# Patient Record
Sex: Female | Born: 1940
Health system: Southern US, Community
[De-identification: ages and names within clinical notes are randomized; demographics above are authoritative.]

## PROBLEM LIST (undated history)

## (undated) DIAGNOSIS — E785 Hyperlipidemia, unspecified: Secondary | ICD-10-CM

## (undated) DIAGNOSIS — I10 Essential (primary) hypertension: Secondary | ICD-10-CM

## (undated) DIAGNOSIS — M8580 Other specified disorders of bone density and structure, unspecified site: Secondary | ICD-10-CM

## (undated) DIAGNOSIS — R7303 Prediabetes: Secondary | ICD-10-CM

## (undated) DIAGNOSIS — G5600 Carpal tunnel syndrome, unspecified upper limb: Secondary | ICD-10-CM

## (undated) HISTORY — PX: APPENDECTOMY: SHX54

## (undated) HISTORY — DX: Prediabetes: R73.03

## (undated) HISTORY — DX: Hyperlipidemia, unspecified: E78.5

## (undated) HISTORY — DX: Carpal tunnel syndrome, unspecified upper limb: G56.00

## (undated) HISTORY — DX: Other specified disorders of bone density and structure, unspecified site: M85.80

## (undated) HISTORY — PX: HERNIA REPAIR: SHX51

## (undated) HISTORY — DX: Essential (primary) hypertension: I10

## (undated) HISTORY — PX: ABDOMINAL HYSTERECTOMY: SHX81

---

## 2008-05-03 ENCOUNTER — Ambulatory Visit: Payer: Self-pay | Admitting: Gastroenterology

## 2008-10-14 ENCOUNTER — Emergency Department: Payer: Self-pay | Admitting: Emergency Medicine

## 2010-11-11 HISTORY — PX: OTHER SURGICAL HISTORY: SHX169

## 2010-11-11 HISTORY — PX: UMBILICAL HERNIA REPAIR: SHX196

## 2011-07-23 ENCOUNTER — Ambulatory Visit: Payer: Self-pay | Admitting: Obstetrics and Gynecology

## 2011-07-31 ENCOUNTER — Ambulatory Visit: Payer: Self-pay | Admitting: Obstetrics and Gynecology

## 2014-02-03 ENCOUNTER — Ambulatory Visit: Payer: Self-pay | Admitting: Family Medicine

## 2015-01-11 DIAGNOSIS — Z1211 Encounter for screening for malignant neoplasm of colon: Secondary | ICD-10-CM | POA: Diagnosis not present

## 2015-01-11 DIAGNOSIS — Z01419 Encounter for gynecological examination (general) (routine) without abnormal findings: Secondary | ICD-10-CM | POA: Diagnosis not present

## 2015-01-19 DIAGNOSIS — H2513 Age-related nuclear cataract, bilateral: Secondary | ICD-10-CM | POA: Diagnosis not present

## 2015-01-24 DIAGNOSIS — E785 Hyperlipidemia, unspecified: Secondary | ICD-10-CM | POA: Diagnosis not present

## 2015-01-24 DIAGNOSIS — I10 Essential (primary) hypertension: Secondary | ICD-10-CM | POA: Diagnosis not present

## 2015-03-01 ENCOUNTER — Ambulatory Visit
Admit: 2015-03-01 | Disposition: A | Discharge status: Home / Self Care | Payer: Self-pay | Attending: Family Medicine | Admitting: Family Medicine

## 2015-03-01 DIAGNOSIS — Z1231 Encounter for screening mammogram for malignant neoplasm of breast: Secondary | ICD-10-CM | POA: Diagnosis not present

## 2015-03-07 ENCOUNTER — Ambulatory Visit
Admit: 2015-03-07 | Disposition: A | Discharge status: Home / Self Care | Payer: Self-pay | Attending: Family Medicine | Admitting: Family Medicine

## 2015-03-07 DIAGNOSIS — R922 Inconclusive mammogram: Secondary | ICD-10-CM | POA: Diagnosis not present

## 2015-03-07 DIAGNOSIS — R928 Other abnormal and inconclusive findings on diagnostic imaging of breast: Secondary | ICD-10-CM | POA: Diagnosis not present

## 2015-03-15 ENCOUNTER — Encounter: Payer: Self-pay | Admitting: Podiatry

## 2015-03-15 ENCOUNTER — Ambulatory Visit (INDEPENDENT_AMBULATORY_CARE_PROVIDER_SITE_OTHER): Payer: Medicare Other

## 2015-03-15 ENCOUNTER — Telehealth: Payer: Self-pay | Admitting: Podiatry

## 2015-03-15 ENCOUNTER — Ambulatory Visit (INDEPENDENT_AMBULATORY_CARE_PROVIDER_SITE_OTHER): Payer: Medicare Other | Admitting: Podiatry

## 2015-03-15 VITALS — BP 171/59 | HR 70 | Resp 16 | Ht 62.0 in | Wt 175.0 lb

## 2015-03-15 DIAGNOSIS — M779 Enthesopathy, unspecified: Secondary | ICD-10-CM | POA: Diagnosis not present

## 2015-03-15 DIAGNOSIS — M722 Plantar fascial fibromatosis: Secondary | ICD-10-CM

## 2015-03-15 MED ORDER — MELOXICAM 15 MG PO TABS: 15.0000 mg | ORAL_TABLET | Freq: Every day | ORAL | Status: DC

## 2015-03-15 MED ORDER — METHYLPREDNISOLONE 4 MG PO TBPK: ORAL_TABLET | ORAL | Status: DC

## 2015-03-15 NOTE — Progress Notes (Signed)
   Subjective:    Patient ID: Susan Summers, female    DOB: [birth year], 74 y.o.   MRN: [id number]  HPI Comments: "I have heel pain"  Patient c/o aching plantar heel left for about 2 months. AM pain. Went to PCP and he said he thought it was just bruised-no treatment. Tried soaking epsom salt. No better.  Also, c/o tender plantar forefoot right for about 2 months. Thought she might have stepped on a rock or something while moving. No treatment.  Foot Pain      Review of Systems  All other systems reviewed and are negative.      Objective:   Physical Exam: I have reviewed her past medical history medications allergies surgery social history and review of system. Pulses are strongly palpable bilateral. Neurologic sensorium is intact per Semmes-Weinstein monofilament. Deep tendon reflexes are intact bilateral and muscle strength +5 over 5 dorsiflexion plantar flexors and inverters everters all of his musculature is intact. Orthopedic evaluation demonstrates pain on end range of motion of the second metatarsophalangeal joint of the right foot. She is pain on palpation medial calcaneal tubercle of the left heel. She has no pain on medial and lateral compression of the calcaneus. Radiographic evaluation does demonstrate a soft tissue increase in density of myofascial calcaneal insertion site of the left heel. No joint abnormality of the second metatarsophalangeal joint right.        Assessment & Plan:  Assessment: Capsulitis second metatarsophalangeal joint right foot. Plantar fasciitis left heel.  Plan: Injected the second tarsophalangeal joint today with dexamethasone and local anesthetic after sterile Betadine skin prep. I also injected her left heel today with Kenalog and local anesthetic. Start her on a Medrol Dosepak to be followed by meloxicam. Her any plantar fascial strap and a night splint. We discussed appropriate shoe gear stretching exercises ice therapy and I will follow-up with  her in 1 month.

## 2015-03-15 NOTE — Patient Instructions (Signed)

## 2015-03-15 NOTE — Telephone Encounter (Signed)
[facility] CALLING ABOUT A MEDICATION THAT WAS TO BE CALLED IN.PLEASE CALL ASAP. THEY HAVE CALLED [facility] TO GET TO YOUR OFFICE

## 2015-04-12 ENCOUNTER — Ambulatory Visit (INDEPENDENT_AMBULATORY_CARE_PROVIDER_SITE_OTHER): Payer: Medicare Other | Admitting: Podiatry

## 2015-04-12 ENCOUNTER — Encounter: Payer: Self-pay | Admitting: Podiatry

## 2015-04-12 VITALS — BP 154/77 | HR 73 | Temp 98.3°F | Resp 16

## 2015-04-12 DIAGNOSIS — M722 Plantar fascial fibromatosis: Secondary | ICD-10-CM | POA: Diagnosis not present

## 2015-04-12 NOTE — Progress Notes (Signed)
She presents today for follow-up of plantar fasciitis. She states that they're both feeling much better the left is a little bit more tender than the right.  Objective: Signs are stable she is alert and oriented 3. She has pain on palpation medial calcaneal tubercle of the left heel. Pulses are palpable.  Assessment: Plantar fasciitis left over right.  Plan: Kenalog injection left heel today. Follow up with her in 1 month continue all other conservative therapies.

## 2015-04-25 ENCOUNTER — Ambulatory Visit (INDEPENDENT_AMBULATORY_CARE_PROVIDER_SITE_OTHER): Payer: Medicare Other | Admitting: Family Medicine

## 2015-04-25 ENCOUNTER — Encounter: Payer: Self-pay | Admitting: Family Medicine

## 2015-04-25 VITALS — BP 140/60 | HR 70 | Resp 15 | Ht 62.0 in | Wt 176.2 lb

## 2015-04-25 DIAGNOSIS — I10 Essential (primary) hypertension: Secondary | ICD-10-CM | POA: Diagnosis not present

## 2015-04-25 DIAGNOSIS — E669 Obesity, unspecified: Secondary | ICD-10-CM | POA: Insufficient documentation

## 2015-04-25 DIAGNOSIS — M722 Plantar fascial fibromatosis: Secondary | ICD-10-CM | POA: Insufficient documentation

## 2015-04-25 DIAGNOSIS — E785 Hyperlipidemia, unspecified: Secondary | ICD-10-CM | POA: Insufficient documentation

## 2015-04-25 MED ORDER — VALSARTAN 80 MG PO TABS: 80.0000 mg | ORAL_TABLET | Freq: Every day | ORAL | Status: DC

## 2015-04-25 MED ORDER — ROSUVASTATIN CALCIUM 20 MG PO TABS: 20.0000 mg | ORAL_TABLET | Freq: Every day | ORAL | Status: DC

## 2015-04-25 MED ORDER — AMLODIPINE BESYLATE 5 MG PO TABS: 5.0000 mg | ORAL_TABLET | Freq: Every day | ORAL | Status: DC

## 2015-04-25 NOTE — Progress Notes (Signed)
Name: Susan Summers   MRN: [id number]    DOB: 02/20/1941   Date:04/25/2015       Progress Note  Subjective  Chief Complaint  Chief Complaint  Patient presents with  . Follow-up    3 month   . Hyperlipidemia  . Hypertension    Hyperlipidemia This is a chronic problem. Recent lipid tests were reviewed and are normal. Pertinent negatives include no chest pain or shortness of breath. Current antihyperlipidemic treatment includes statins. The current treatment provides significant improvement of lipids. There are no compliance problems.   Hypertension This is a chronic problem. Pertinent negatives include no chest pain, headaches, orthopnea, palpitations, peripheral edema or shortness of breath. Past treatments include angiotensin blockers. The current treatment provides significant improvement. There is no history of angina, kidney disease, CAD/MI or CVA.      Past Medical History  Diagnosis Date  . Hypertension   . Hyperlipidemia     Past Surgical History  Procedure Laterality Date  . Abdominal hysterectomy    . Hernia repair      Family History  Problem Relation Age of Onset  . Hypertension Mother   . Stroke Mother   . Rheum arthritis Mother   . Diabetes Father   . Cancer Sister     Ovarian  . Diabetes Sister   . Hypertension Brother   . Melanoma Daughter   . Hypertension Son   . Hyperlipidemia Son   . Stroke Brother   . Cancer Brother     Prostate  . Hyperlipidemia Sister     History   Social History  . Marital Status: Widowed    Spouse Name: N/A  . Number of Children: N/A  . Years of Education: N/A   Occupational History  . Not on file.   Social History Main Topics  . Smoking status: Never Smoker   . Smokeless tobacco: Never Used  . Alcohol Use: No  . Drug Use: No  . Sexual Activity: Not Currently   Other Topics Concern  . Not on file   Social History Narrative     Current outpatient prescriptions:  .  amLODipine (NORVASC) 5 MG tablet,  Take 1 tablet (5 mg total) by mouth daily., Disp: 90 tablet, Rfl: 0 .  calcium citrate-vitamin D (CITRACAL+D) 315-200 MG-UNIT per tablet, Take 1 tablet by mouth 2 (two) times daily., Disp: , Rfl:  .  Cholecalciferol (VITAMIN D PO), Take 2,000 Units by mouth daily. , Disp: , Rfl:  .  ELDERBERRY PO, Take 1,250 mg by mouth daily. , Disp: , Rfl:  .  meloxicam (MOBIC) 15 MG tablet, Take 1 tablet (15 mg total) by mouth daily., Disp: 30 tablet, Rfl: 3 .  Omega-3 Fatty Acids (FISH OIL) 1000 MG CAPS, Take by mouth daily., Disp: , Rfl:  .  Probiotic Product (PROBIOTIC DAILY PO), Take by mouth daily. , Disp: , Rfl:  .  rosuvastatin (CRESTOR) 20 MG tablet, Take 1 tablet (20 mg total) by mouth daily., Disp: 90 tablet, Rfl: 0 .  valsartan (DIOVAN) 80 MG tablet, Take 1 tablet (80 mg total) by mouth daily., Disp: 90 tablet, Rfl: 0  Allergies  Allergen Reactions  . Nickel   . Penicillins      Review of Systems  Respiratory: Negative for shortness of breath.   Cardiovascular: Negative for chest pain, palpitations and orthopnea.  Neurological: Negative for headaches.      Objective  Filed Vitals:   04/25/15 0853  BP: 140/60  Pulse: 70  Resp: 15  Height: 5\' 2"  (1.575 m)  Weight: 176 lb 3.2 oz (79.924 kg)  SpO2: 94%    Physical Exam  Constitutional: She is well-developed, well-nourished, and in no distress.  HENT:  Head: Normocephalic and atraumatic.  Cardiovascular: Normal rate and regular rhythm.   Pulmonary/Chest: Effort normal and breath sounds normal.  Abdominal: Soft. Bowel sounds are normal.       No results found for this or any previous visit (from the past 2160 hour(s)).   Assessment & Plan 1. Benign hypertension  - valsartan (DIOVAN) 80 MG tablet; Take 1 tablet (80 mg total) by mouth daily.  Dispense: 90 tablet; Refill: 0 - amLODipine (NORVASC) 5 MG tablet; Take 1 tablet (5 mg total) by mouth daily.  Dispense: 90 tablet; Refill: 0  2. HLD (hyperlipidemia)  -  rosuvastatin (CRESTOR) 20 MG tablet; Take 1 tablet (20 mg total) by mouth daily.  Dispense: 90 tablet; Refill: 0 - Lipid Profile - Comprehensive metabolic panel  There are no diagnoses linked to this encounter.  Deloma Spindle [facility] 04/25/2015 9:27 AM

## 2015-04-26 LAB — COMPREHENSIVE METABOLIC PANEL
ALBUMIN: 4.6 g/dL (ref 3.5–4.8)
ALK PHOS: 85 IU/L (ref 39–117)
ALT: 21 IU/L (ref 0–32)
AST: 18 IU/L (ref 0–40)
Albumin/Globulin Ratio: 1.9 (ref 1.1–2.5)
BUN / CREAT RATIO: 15 (ref 11–26)
BUN: 11 mg/dL (ref 8–27)
Bilirubin Total: 0.8 mg/dL (ref 0.0–1.2)
CO2: 24 mmol/L (ref 18–29)
CREATININE: 0.71 mg/dL (ref 0.57–1.00)
Calcium: 9.4 mg/dL (ref 8.7–10.3)
Chloride: 101 mmol/L (ref 97–108)
GFR calc non Af Amer: 85 mL/min/{1.73_m2} (ref >59)
GFR, EST AFRICAN AMERICAN: 98 mL/min/{1.73_m2} (ref >59)
Globulin, Total: 2.4 g/dL (ref 1.5–4.5)
Glucose: 109 mg/dL — ABNORMAL HIGH (ref 65–99)
Potassium: 4.9 mmol/L (ref 3.5–5.2)
Sodium: 143 mmol/L (ref 134–144)
Total Protein: 7 g/dL (ref 6.0–8.5)

## 2015-04-26 LAB — LIPID PANEL
Chol/HDL Ratio: 2.9 ratio units (ref 0.0–4.4)
Cholesterol, Total: 153 mg/dL (ref 100–199)
HDL: 53 mg/dL (ref >39)
LDL Calculated: 74 mg/dL (ref 0–99)
Triglycerides: 132 mg/dL (ref 0–149)
VLDL Cholesterol Cal: 26 mg/dL (ref 5–40)

## 2015-05-10 ENCOUNTER — Ambulatory Visit: Payer: Medicare Other | Admitting: Podiatry

## 2015-07-12 ENCOUNTER — Telehealth: Payer: Self-pay | Admitting: Family Medicine

## 2015-07-12 DIAGNOSIS — I10 Essential (primary) hypertension: Secondary | ICD-10-CM

## 2015-07-12 MED ORDER — AMLODIPINE BESYLATE 5 MG PO TABS: 5.0000 mg | ORAL_TABLET | Freq: Every day | ORAL | Status: DC

## 2015-07-12 NOTE — Telephone Encounter (Signed)
Medication has been refilled and sent to [facility] 

## 2015-07-24 ENCOUNTER — Telehealth: Payer: Self-pay | Admitting: Family Medicine

## 2015-07-24 DIAGNOSIS — I10 Essential (primary) hypertension: Secondary | ICD-10-CM

## 2015-07-24 MED ORDER — VALSARTAN 80 MG PO TABS: 80.0000 mg | ORAL_TABLET | Freq: Every day | ORAL | Status: DC

## 2015-07-24 NOTE — Telephone Encounter (Signed)
Medication has been refilled and sent to [facility] °

## 2015-07-27 ENCOUNTER — Ambulatory Visit: Payer: Medicare Other | Admitting: Family Medicine

## 2015-07-31 ENCOUNTER — Ambulatory Visit: Payer: Medicare Other | Admitting: Family Medicine

## 2015-08-09 ENCOUNTER — Ambulatory Visit (INDEPENDENT_AMBULATORY_CARE_PROVIDER_SITE_OTHER): Payer: Medicare Other | Admitting: Family Medicine

## 2015-08-09 ENCOUNTER — Encounter: Payer: Self-pay | Admitting: Family Medicine

## 2015-08-09 VITALS — BP 136/77 | HR 73 | Temp 98.2°F | Resp 17 | Ht 62.0 in | Wt 179.8 lb

## 2015-08-09 DIAGNOSIS — E785 Hyperlipidemia, unspecified: Secondary | ICD-10-CM

## 2015-08-09 DIAGNOSIS — M25472 Effusion, left ankle: Secondary | ICD-10-CM

## 2015-08-09 DIAGNOSIS — M25471 Effusion, right ankle: Secondary | ICD-10-CM

## 2015-08-09 DIAGNOSIS — I10 Essential (primary) hypertension: Secondary | ICD-10-CM | POA: Diagnosis not present

## 2015-08-09 DIAGNOSIS — M722 Plantar fascial fibromatosis: Secondary | ICD-10-CM

## 2015-08-09 HISTORY — DX: Effusion, left ankle: M25.471

## 2015-08-09 MED ORDER — VALSARTAN 80 MG PO TABS: 160.0000 mg | ORAL_TABLET | Freq: Every day | ORAL | Status: DC

## 2015-08-09 MED ORDER — ROSUVASTATIN CALCIUM 20 MG PO TABS: 20.0000 mg | ORAL_TABLET | Freq: Every day | ORAL | Status: DC

## 2015-08-09 NOTE — Progress Notes (Signed)
Name: Susan Summers   MRN: [id number]    DOB: 24-Oct-1941   Date:08/09/2015       Progress Note  Subjective  Chief Complaint  Chief Complaint  Patient presents with  . Follow-up    3 MO  . Hyperlipidemia  . Hypertension    Hyperlipidemia This is a chronic problem. The problem is controlled. Recent lipid tests were reviewed and are normal. Pertinent negatives include no chest pain, leg pain, myalgias or shortness of breath. Current antihyperlipidemic treatment includes statins.  Hypertension This is a chronic problem. The problem is unchanged. The problem is controlled. Pertinent negatives include no blurred vision, chest pain, headaches, palpitations (has noticed that her heart will flutter 'every once in a while') or shortness of breath. Past treatments include angiotensin blockers and calcium channel blockers. There is no history of kidney disease, CAD/MI or CVA.   Past Medical History  Diagnosis Date  . Hypertension   . Hyperlipidemia     Past Surgical History  Procedure Laterality Date  . Abdominal hysterectomy    . Hernia repair      Family History  Problem Relation Age of Onset  . Hypertension Mother   . Stroke Mother   . Rheum arthritis Mother   . Diabetes Father   . Cancer Sister     Ovarian  . Diabetes Sister   . Hypertension Brother   . Melanoma Daughter   . Hypertension Son   . Hyperlipidemia Son   . Stroke Brother   . Cancer Brother     Prostate  . Hyperlipidemia Sister     Social History   Social History  . Marital Status: Widowed    Spouse Name: N/A  . Number of Children: N/A  . Years of Education: N/A   Occupational History  . Not on file.   Social History Main Topics  . Smoking status: Never Smoker   . Smokeless tobacco: Never Used  . Alcohol Use: No  . Drug Use: No  . Sexual Activity: Not Currently   Other Topics Concern  . Not on file   Social History Narrative    Current outpatient prescriptions:  .  amLODipine (NORVASC) 5 MG  tablet, Take 1 tablet (5 mg total) by mouth daily., Disp: 90 tablet, Rfl: 0 .  calcium citrate-vitamin D (CITRACAL+D) 315-200 MG-UNIT per tablet, Take 1 tablet by mouth 2 (two) times daily., Disp: , Rfl:  .  Cholecalciferol (VITAMIN D PO), Take 2,000 Units by mouth daily. , Disp: , Rfl:  .  ELDERBERRY PO, Take 1,250 mg by mouth daily. , Disp: , Rfl:  .  meloxicam (MOBIC) 15 MG tablet, Take 1 tablet (15 mg total) by mouth daily., Disp: 30 tablet, Rfl: 3 .  Omega-3 Fatty Acids (FISH OIL) 1000 MG CAPS, Take by mouth daily., Disp: , Rfl:  .  Probiotic Product (PROBIOTIC DAILY PO), Take by mouth daily. , Disp: , Rfl:  .  rosuvastatin (CRESTOR) 20 MG tablet, Take 1 tablet (20 mg total) by mouth daily., Disp: 90 tablet, Rfl: 0 .  valsartan (DIOVAN) 80 MG tablet, Take 1 tablet (80 mg total) by mouth daily., Disp: 90 tablet, Rfl: 0  Allergies  Allergen Reactions  . Nickel   . Penicillins    Review of Systems  Eyes: Negative for blurred vision and double vision.  Respiratory: Negative for shortness of breath.   Cardiovascular: Positive for leg swelling. Negative for chest pain and palpitations (has noticed that her heart will flutter 'every once  in a while').  Musculoskeletal: Negative for myalgias.  Neurological: Negative for headaches.    Objective  Filed Vitals:   08/09/15 1459  BP: 136/77  Pulse: 73  Temp: 98.2 F (36.8 C)  TempSrc: Oral  Resp: 17  Height: 5\' 2"  (1.575 m)  Weight: 179 lb 12.8 oz (81.557 kg)  SpO2: 97%    Physical Exam  Constitutional: She is oriented to person, place, and time and well-developed, well-nourished, and in no distress.  HENT:  Head: Normocephalic and atraumatic.  Cardiovascular: Normal rate and regular rhythm.   Pulmonary/Chest: Effort normal and breath sounds normal.  Musculoskeletal:       Right ankle: She exhibits no swelling.       Left ankle: She exhibits no swelling.  Neurological: She is alert and oriented to person, place, and time.   Nursing note and vitals reviewed.   Assessment & Plan  1. Benign hypertension Increase Diovan to 160 mg daily. DC amlodipine due to concern for bilateral ankle swelling. Follow-up with blood pressure check in 3 months. - valsartan (DIOVAN) 80 MG tablet; Take 2 tablets (160 mg total) by mouth daily.  Dispense: 90 tablet; Refill: 0  2. Plantar fasciitis Followed by podiatry and currently on meloxicam 15 mg daily.  3. HLD (hyperlipidemia)  - rosuvastatin (CRESTOR) 20 MG tablet; Take 1 tablet (20 mg total) by mouth at bedtime.  Dispense: 90 tablet; Refill: 0  4. Swelling of both ankles DC amlodipine and evaluate if the ankle swelling goes away. Patient was asked to elevate her ankles as well. Follow-up if no clinical improvement.   Syed [facility] 08/09/2015 3:11 PM

## 2015-09-13 ENCOUNTER — Telehealth: Payer: Self-pay

## 2015-09-13 DIAGNOSIS — I10 Essential (primary) hypertension: Secondary | ICD-10-CM

## 2015-09-13 MED ORDER — VALSARTAN 80 MG PO TABS: 160.0000 mg | ORAL_TABLET | Freq: Every day | ORAL | Status: DC

## 2015-09-13 NOTE — Telephone Encounter (Signed)
Valsartan 80mg  has been refilled and sent to [facility]

## 2015-11-09 ENCOUNTER — Other Ambulatory Visit: Payer: Self-pay | Admitting: Family Medicine

## 2015-11-14 ENCOUNTER — Encounter: Payer: Self-pay | Admitting: Family Medicine

## 2015-11-14 ENCOUNTER — Ambulatory Visit (INDEPENDENT_AMBULATORY_CARE_PROVIDER_SITE_OTHER): Payer: Medicare Other | Admitting: Family Medicine

## 2015-11-14 VITALS — BP 134/78 | HR 72 | Temp 98.1°F | Resp 15 | Ht 62.0 in | Wt 182.1 lb

## 2015-11-14 DIAGNOSIS — E785 Hyperlipidemia, unspecified: Secondary | ICD-10-CM

## 2015-11-14 DIAGNOSIS — R739 Hyperglycemia, unspecified: Secondary | ICD-10-CM | POA: Diagnosis not present

## 2015-11-14 DIAGNOSIS — I10 Essential (primary) hypertension: Secondary | ICD-10-CM | POA: Diagnosis not present

## 2015-11-14 LAB — POCT GLYCOSYLATED HEMOGLOBIN (HGB A1C): Hemoglobin A1C: 5.7

## 2015-11-14 MED ORDER — VALSARTAN 80 MG PO TABS: 160.0000 mg | ORAL_TABLET | Freq: Every day | ORAL | Status: DC

## 2015-11-14 MED ORDER — ROSUVASTATIN CALCIUM 20 MG PO TABS: 20.0000 mg | ORAL_TABLET | Freq: Every day | ORAL | Status: DC

## 2015-11-14 NOTE — Progress Notes (Signed)
Name: Susan Summers   MRN: [id number]    DOB: September 08, 1941   Date:11/14/2015       Progress Note  Subjective  Chief Complaint  Chief Complaint  Patient presents with  . Follow-up    3 mo  . Hyperlipidemia  . Hypertension    Hyperlipidemia This is a chronic problem. The problem is controlled. Recent lipid tests were reviewed and are normal. Pertinent negatives include no chest pain, leg pain, myalgias or shortness of breath. Current antihyperlipidemic treatment includes statins.  Hypertension This is a chronic problem. The problem is unchanged. The problem is controlled. Associated symptoms include headaches. Pertinent negatives include no blurred vision, chest pain, palpitations or shortness of breath. Past treatments include angiotensin blockers. There is no history of kidney disease, CAD/MI or CVA.    Past Medical History  Diagnosis Date  . Hypertension   . Hyperlipidemia     Past Surgical History  Procedure Laterality Date  . Abdominal hysterectomy    . Hernia repair      Family History  Problem Relation Age of Onset  . Hypertension Mother   . Stroke Mother   . Rheum arthritis Mother   . Diabetes Father   . Cancer Sister     Ovarian  . Diabetes Sister   . Hypertension Brother   . Melanoma Daughter   . Hypertension Son   . Hyperlipidemia Son   . Stroke Brother   . Cancer Brother     Prostate  . Hyperlipidemia Sister     Social History   Social History  . Marital Status: Widowed    Spouse Name: N/A  . Number of Children: N/A  . Years of Education: N/A   Occupational History  . Not on file.   Social History Main Topics  . Smoking status: Never Smoker   . Smokeless tobacco: Never Used  . Alcohol Use: No  . Drug Use: No  . Sexual Activity: Not Currently   Other Topics Concern  . Not on file   Social History Narrative    Current outpatient prescriptions:  .  calcium citrate-vitamin D (CITRACAL+D) 315-200 MG-UNIT per tablet, Take 1 tablet by mouth 2  (two) times daily., Disp: , Rfl:  .  Cholecalciferol (VITAMIN D PO), Take 2,000 Units by mouth daily. , Disp: , Rfl:  .  ELDERBERRY PO, Take 1,250 mg by mouth daily. , Disp: , Rfl:  .  meloxicam (MOBIC) 15 MG tablet, Take 1 tablet (15 mg total) by mouth daily., Disp: 30 tablet, Rfl: 3 .  Omega-3 Fatty Acids (FISH OIL) 1000 MG CAPS, Take by mouth daily., Disp: , Rfl:  .  Probiotic Product (PROBIOTIC DAILY PO), Take by mouth daily. , Disp: , Rfl:  .  rosuvastatin (CRESTOR) 20 MG tablet, TAKE 1 TABLET BY MOUTH DAILY AT BEDTIME, Disp: 90 tablet, Rfl: 0 .  valsartan (DIOVAN) 80 MG tablet, TAKE 2 TABLETS BY MOUTH DAILY, Disp: 180 tablet, Rfl: 0  Allergies  Allergen Reactions  . Nickel   . Penicillins      Review of Systems  Constitutional: Negative for fever, chills and weight loss.  Eyes: Negative for blurred vision and double vision.  Respiratory: Negative for cough and shortness of breath.   Cardiovascular: Negative for chest pain and palpitations.  Musculoskeletal: Negative for myalgias.  Neurological: Positive for headaches.    Objective  Filed Vitals:   11/14/15 1404  BP: 134/78  Pulse: 72  Temp: 98.1 F (36.7 C)  TempSrc: Oral  Resp: 15  Height: 5\' 2"  (1.575 m)  Weight: 182 lb 1.6 oz (82.6 kg)  SpO2: 96%    Physical Exam  Constitutional: She is oriented to person, place, and time and well-developed, well-nourished, and in no distress.  HENT:  Head: Normocephalic and atraumatic.  Cardiovascular: Normal rate and regular rhythm.   Pulmonary/Chest: Effort normal and breath sounds normal.  Musculoskeletal: She exhibits no edema or tenderness.       Right ankle: She exhibits no swelling.       Left ankle: She exhibits no swelling.  Neurological: She is alert and oriented to person, place, and time.  Skin: Skin is warm and dry.  Nursing note and vitals reviewed.    Assessment & Plan  1. Benign hypertension  - valsartan (DIOVAN) 80 MG tablet; Take 2 tablets (160 mg  total) by mouth daily.  Dispense: 180 tablet; Refill: 0  2. HLD (hyperlipidemia)  - rosuvastatin (CRESTOR) 20 MG tablet; Take 1 tablet (20 mg total) by mouth at bedtime.  Dispense: 90 tablet; Refill: 0 - Lipid Profile - Comprehensive Metabolic Panel (CMET)  3. Hyperglycemia  - POCT HgB A1C   Trinaty Bundrick [facility] 11/14/2015 2:19 PM

## 2015-11-15 LAB — COMPREHENSIVE METABOLIC PANEL
ALT: 20 IU/L (ref 0–32)
AST: 17 IU/L (ref 0–40)
Albumin/Globulin Ratio: 2 (ref 1.1–2.5)
Albumin: 4.7 g/dL (ref 3.5–4.8)
Alkaline Phosphatase: 81 IU/L (ref 39–117)
BUN/Creatinine Ratio: 18 (ref 11–26)
BUN: 12 mg/dL (ref 8–27)
Bilirubin Total: 1.1 mg/dL (ref 0.0–1.2)
CALCIUM: 9.6 mg/dL (ref 8.7–10.3)
CO2: 26 mmol/L (ref 18–29)
CREATININE: 0.66 mg/dL (ref 0.57–1.00)
Chloride: 100 mmol/L (ref 96–106)
GFR calc Af Amer: 102 mL/min/{1.73_m2} (ref >59)
GFR, EST NON AFRICAN AMERICAN: 89 mL/min/{1.73_m2} (ref >59)
Globulin, Total: 2.4 g/dL (ref 1.5–4.5)
Glucose: 93 mg/dL (ref 65–99)
POTASSIUM: 4.3 mmol/L (ref 3.5–5.2)
Sodium: 141 mmol/L (ref 134–144)
Total Protein: 7.1 g/dL (ref 6.0–8.5)

## 2015-11-15 LAB — LIPID PANEL
CHOL/HDL RATIO: 3.2 ratio (ref 0.0–4.4)
Cholesterol, Total: 139 mg/dL (ref 100–199)
HDL: 43 mg/dL (ref >39)
LDL Calculated: 68 mg/dL (ref 0–99)
Triglycerides: 140 mg/dL (ref 0–149)
VLDL CHOLESTEROL CAL: 28 mg/dL (ref 5–40)

## 2016-01-10 ENCOUNTER — Encounter: Payer: Self-pay | Admitting: Family Medicine

## 2016-01-10 ENCOUNTER — Ambulatory Visit (INDEPENDENT_AMBULATORY_CARE_PROVIDER_SITE_OTHER): Payer: Medicare Other | Admitting: Family Medicine

## 2016-01-10 VITALS — BP 132/77 | HR 77 | Temp 98.4°F | Resp 16 | Ht 62.0 in | Wt 184.3 lb

## 2016-01-10 DIAGNOSIS — L309 Dermatitis, unspecified: Secondary | ICD-10-CM | POA: Diagnosis not present

## 2016-01-10 MED ORDER — TRIAMCINOLONE ACETONIDE 0.5 % EX CREA: 1.0000 "application " | TOPICAL_CREAM | Freq: Three times a day (TID) | CUTANEOUS | Status: DC

## 2016-01-10 NOTE — Progress Notes (Signed)
Name: Susan Summers   MRN: [id number]    DOB: Sep 18, 1941   Date:01/10/2016       Progress Note  Subjective  Chief Complaint  Chief Complaint  Patient presents with  . Acute Visit    Rash    Rash This is a recurrent problem. The affected locations include the left lower leg, right lowerleg, left arm and right arm. The rash is characterized by itchiness and redness. She was exposed to nothing (However, first time she noticed the rash was when she returned from the beach in August 2016.). Pertinent negatives include no fatigue, fever, joint pain or shortness of breath. Past treatments include moisturizer and anti-itch cream. The treatment provided no relief. There is no history of asthma or eczema.    Past Medical History  Diagnosis Date  . Hypertension   . Hyperlipidemia     Past Surgical History  Procedure Laterality Date  . Abdominal hysterectomy    . Hernia repair      Family History  Problem Relation Age of Onset  . Hypertension Mother   . Stroke Mother   . Rheum arthritis Mother   . Diabetes Father   . Cancer Sister     Ovarian  . Diabetes Sister   . Hypertension Brother   . Melanoma Daughter   . Hypertension Son   . Hyperlipidemia Son   . Stroke Brother   . Cancer Brother     Prostate  . Hyperlipidemia Sister     Social History   Social History  . Marital Status: Widowed    Spouse Name: N/A  . Number of Children: N/A  . Years of Education: N/A   Occupational History  . Not on file.   Social History Main Topics  . Smoking status: Never Smoker   . Smokeless tobacco: Never Used  . Alcohol Use: No  . Drug Use: No  . Sexual Activity: Not Currently   Other Topics Concern  . Not on file   Social History Narrative     Current outpatient prescriptions:  .  calcium citrate-vitamin D (CITRACAL+D) 315-200 MG-UNIT per tablet, Take 1 tablet by mouth 2 (two) times daily., Disp: , Rfl:  .  Cholecalciferol (VITAMIN D PO), Take 2,000 Units by mouth daily. ,  Disp: , Rfl:  .  ELDERBERRY PO, Take 1,250 mg by mouth daily. , Disp: , Rfl:  .  Omega-3 Fatty Acids (FISH OIL) 1000 MG CAPS, Take by mouth daily., Disp: , Rfl:  .  Probiotic Product (PROBIOTIC DAILY PO), Take by mouth daily. , Disp: , Rfl:  .  rosuvastatin (CRESTOR) 20 MG tablet, Take 1 tablet (20 mg total) by mouth at bedtime., Disp: 90 tablet, Rfl: 0 .  valsartan (DIOVAN) 80 MG tablet, Take 2 tablets (160 mg total) by mouth daily., Disp: 180 tablet, Rfl: 0 .  meloxicam (MOBIC) 15 MG tablet, Take 1 tablet (15 mg total) by mouth daily. (Patient not taking: Reported on 01/10/2016), Disp: 30 tablet, Rfl: 3  Allergies  Allergen Reactions  . Nickel   . Penicillins      Review of Systems  Constitutional: Negative for fever, chills and fatigue.  Respiratory: Negative for shortness of breath.   Musculoskeletal: Negative for joint pain.  Skin: Positive for rash.     Objective  Filed Vitals:   01/10/16 0845  BP: 132/77  Pulse: 77  Temp: 98.4 F (36.9 C)  TempSrc: Oral  Resp: 16  Height: 5\' 2"  (1.575 m)  Weight: 184 lb  4.8 oz (83.598 kg)  SpO2: 98%    Physical Exam  Constitutional: She is well-developed, well-nourished, and in no distress.  Skin: Rash noted. Rash is pustular.  Scattered healing pustular lesions, spread over left and right distal lower extremities, and upper extremities.   Nursing note and vitals reviewed.   Recent Results (from the past 2160 hour(s))  Lipid Profile     Status: None   Collection Time: 11/14/15  2:42 PM  Result Value Ref Range   Cholesterol, Total 139 100 - 199 mg/dL   Triglycerides 140 0 - 149 mg/dL   HDL 43 >39 mg/dL   VLDL Cholesterol Cal 28 5 - 40 mg/dL   LDL Calculated 68 0 - 99 mg/dL   Chol/HDL Ratio 3.2 0.0 - 4.4 ratio units    Comment:                                   T. Chol/HDL Ratio                                             Men  Women                               1/2 Avg.Risk  3.4    3.3                                    Avg.Risk  5.0    4.4                                2X Avg.Risk  9.6    7.1                                3X Avg.Risk 23.4   11.0   Comprehensive Metabolic Panel (CMET)     Status: None   Collection Time: 11/14/15  2:42 PM  Result Value Ref Range   Glucose 93 65 - 99 mg/dL   BUN 12 8 - 27 mg/dL   Creatinine, Ser 0.66 0.57 - 1.00 mg/dL   GFR calc non Af Amer 89 >59 mL/min/1.73   GFR calc Af Amer 102 >59 mL/min/1.73   BUN/Creatinine Ratio 18 11 - 26   Sodium 141 134 - 144 mmol/L   Potassium 4.3 3.5 - 5.2 mmol/L   Chloride 100 96 - 106 mmol/L   CO2 26 18 - 29 mmol/L   Calcium 9.6 8.7 - 10.3 mg/dL   Total Protein 7.1 6.0 - 8.5 g/dL   Albumin 4.7 3.5 - 4.8 g/dL   Globulin, Total 2.4 1.5 - 4.5 g/dL   Albumin/Globulin Ratio 2.0 1.1 - 2.5   Bilirubin Total 1.1 0.0 - 1.2 mg/dL   Alkaline Phosphatase 81 39 - 117 IU/L   AST 17 0 - 40 IU/L   ALT 20 0 - 32 IU/L  POCT HgB A1C     Status: Normal   Collection Time: 11/14/15  3:12 PM  Result Value Ref Range   Hemoglobin A1C 5.7      Assessment & Plan  1. Dermatitis Start on Kenalog cream for relief of dermatitis. - triamcinolone cream (KENALOG) 0.5 %; Apply 1 application topically 3 (three) times daily.  Dispense: 30 g; Refill: 0   Malacai Grantz [facility] 01/10/2016 8:48 AM

## 2016-04-09 DIAGNOSIS — D229 Melanocytic nevi, unspecified: Secondary | ICD-10-CM | POA: Diagnosis not present

## 2016-04-09 DIAGNOSIS — L814 Other melanin hyperpigmentation: Secondary | ICD-10-CM | POA: Diagnosis not present

## 2016-04-09 DIAGNOSIS — D1800 Hemangioma unspecified site: Secondary | ICD-10-CM | POA: Diagnosis not present

## 2016-04-09 DIAGNOSIS — L719 Rosacea, unspecified: Secondary | ICD-10-CM | POA: Diagnosis not present

## 2016-04-09 DIAGNOSIS — L821 Other seborrheic keratosis: Secondary | ICD-10-CM | POA: Diagnosis not present

## 2016-05-13 ENCOUNTER — Encounter: Payer: Self-pay | Admitting: Family Medicine

## 2016-05-13 ENCOUNTER — Ambulatory Visit (INDEPENDENT_AMBULATORY_CARE_PROVIDER_SITE_OTHER): Payer: Medicare Other | Admitting: Family Medicine

## 2016-05-13 VITALS — BP 136/71 | HR 70 | Temp 97.7°F | Resp 16 | Ht 62.0 in | Wt 183.1 lb

## 2016-05-13 DIAGNOSIS — I10 Essential (primary) hypertension: Secondary | ICD-10-CM | POA: Diagnosis not present

## 2016-05-13 DIAGNOSIS — E785 Hyperlipidemia, unspecified: Secondary | ICD-10-CM | POA: Diagnosis not present

## 2016-05-13 MED ORDER — VALSARTAN 80 MG PO TABS: 160.0000 mg | ORAL_TABLET | Freq: Every day | ORAL | Status: DC

## 2016-05-13 MED ORDER — ROSUVASTATIN CALCIUM 20 MG PO TABS: 20.0000 mg | ORAL_TABLET | Freq: Every day | ORAL | Status: DC

## 2016-05-13 NOTE — Progress Notes (Signed)
Name: Susan Summers   MRN: [id number]    DOB: 02-19-1941   Date:05/13/2016       Progress Note  Subjective  Chief Complaint  Chief Complaint  Patient presents with  . Follow-up    6 mo  . Medication Refill    crestor 20 mg / valsartan 160 mg     Hyperlipidemia This is a chronic problem. The problem is controlled. Recent lipid tests were reviewed and are normal. Pertinent negatives include no chest pain, leg pain, myalgias or shortness of breath. Current antihyperlipidemic treatment includes statins.  Hypertension This is a chronic problem. The problem is unchanged. The problem is controlled. Pertinent negatives include no blurred vision, chest pain, headaches, palpitations or shortness of breath. Past treatments include angiotensin blockers. There is no history of kidney disease, CAD/MI or CVA.    Past Medical History  Diagnosis Date  . Hypertension   . Hyperlipidemia     Past Surgical History  Procedure Laterality Date  . Abdominal hysterectomy    . Hernia repair      Family History  Problem Relation Age of Onset  . Hypertension Mother   . Stroke Mother   . Rheum arthritis Mother   . Diabetes Father   . Cancer Sister     Ovarian  . Diabetes Sister   . Hypertension Brother   . Melanoma Daughter   . Hypertension Son   . Hyperlipidemia Son   . Stroke Brother   . Cancer Brother     Prostate  . Hyperlipidemia Sister     Social History   Social History  . Marital Status: Widowed    Spouse Name: N/A  . Number of Children: N/A  . Years of Education: N/A   Occupational History  . Not on file.   Social History Main Topics  . Smoking status: Never Smoker   . Smokeless tobacco: Never Used  . Alcohol Use: No  . Drug Use: No  . Sexual Activity: Not Currently   Other Topics Concern  . Not on file   Social History Narrative     Current outpatient prescriptions:  .  calcium citrate-vitamin D (CITRACAL+D) 315-200 MG-UNIT per tablet, Take 1 tablet by mouth 2  (two) times daily., Disp: , Rfl:  .  Cholecalciferol (VITAMIN D PO), Take 2,000 Units by mouth daily. , Disp: , Rfl:  .  ELDERBERRY PO, Take 1,250 mg by mouth daily. , Disp: , Rfl:  .  meloxicam (MOBIC) 15 MG tablet, Take 1 tablet (15 mg total) by mouth daily., Disp: 30 tablet, Rfl: 3 .  Omega-3 Fatty Acids (FISH OIL) 1000 MG CAPS, Take by mouth daily., Disp: , Rfl:  .  Probiotic Product (PROBIOTIC DAILY PO), Take by mouth daily. , Disp: , Rfl:  .  rosuvastatin (CRESTOR) 20 MG tablet, Take 1 tablet (20 mg total) by mouth at bedtime., Disp: 90 tablet, Rfl: 0 .  triamcinolone cream (KENALOG) 0.5 %, Apply 1 application topically 3 (three) times daily., Disp: 30 g, Rfl: 0 .  valsartan (DIOVAN) 80 MG tablet, Take 2 tablets (160 mg total) by mouth daily., Disp: 180 tablet, Rfl: 0  Allergies  Allergen Reactions  . Nickel   . Penicillins      Review of Systems  Eyes: Negative for blurred vision.  Respiratory: Negative for shortness of breath.   Cardiovascular: Negative for chest pain and palpitations.  Musculoskeletal: Negative for myalgias.  Neurological: Negative for headaches.    Objective  Filed Vitals:  05/13/16 0846  BP: 136/71  Pulse: 70  Temp: 97.7 F (36.5 C)  TempSrc: Oral  Resp: 16  Height: 5\' 2"  (1.575 m)  Weight: 183 lb 1.6 oz (83.054 kg)  SpO2: 96%    Physical Exam  Constitutional: She is oriented to person, place, and time and well-developed, well-nourished, and in no distress.  HENT:  Head: Normocephalic and atraumatic.  Cardiovascular: Normal rate and regular rhythm.   Pulmonary/Chest: Effort normal and breath sounds normal.  Musculoskeletal: She exhibits no edema or tenderness.       Right ankle: She exhibits no swelling.       Left ankle: She exhibits no swelling.  Neurological: She is alert and oriented to person, place, and time.  Skin: Skin is warm and dry.  Nursing note and vitals reviewed.      Assessment & Plan  1. Benign hypertension BP  at goal and controlled on present antihypertensive therapy. - valsartan (DIOVAN) 80 MG tablet; Take 2 tablets (160 mg total) by mouth daily.  Dispense: 180 tablet; Refill: 0  2. HLD (hyperlipidemia) FLP is at goal - rosuvastatin (CRESTOR) 20 MG tablet; Take 1 tablet (20 mg total) by mouth at bedtime.  Dispense: 90 tablet; Refill: 0 - Lipid Profile - Comprehensive Metabolic Panel (CMET)   Susan Summers [facility] 05/13/2016 8:57 AM

## 2016-05-14 LAB — LIPID PANEL
CHOLESTEROL TOTAL: 125 mg/dL (ref 100–199)
Chol/HDL Ratio: 3 ratio units (ref 0.0–4.4)
HDL: 41 mg/dL (ref >39)
LDL Calculated: 55 mg/dL (ref 0–99)
TRIGLYCERIDES: 146 mg/dL (ref 0–149)
VLDL Cholesterol Cal: 29 mg/dL (ref 5–40)

## 2016-05-14 LAB — COMPREHENSIVE METABOLIC PANEL
A/G RATIO: 2.1 (ref 1.2–2.2)
ALBUMIN: 4.7 g/dL (ref 3.5–4.8)
ALT: 23 IU/L (ref 0–32)
AST: 21 IU/L (ref 0–40)
Alkaline Phosphatase: 79 IU/L (ref 39–117)
BUN / CREAT RATIO: 20 (ref 12–28)
BUN: 13 mg/dL (ref 8–27)
Bilirubin Total: 0.7 mg/dL (ref 0.0–1.2)
CO2: 24 mmol/L (ref 18–29)
Calcium: 9.2 mg/dL (ref 8.7–10.3)
Chloride: 101 mmol/L (ref 96–106)
Creatinine, Ser: 0.66 mg/dL (ref 0.57–1.00)
GFR, EST AFRICAN AMERICAN: 101 mL/min/{1.73_m2} (ref >59)
GFR, EST NON AFRICAN AMERICAN: 88 mL/min/{1.73_m2} (ref >59)
Globulin, Total: 2.2 g/dL (ref 1.5–4.5)
Glucose: 104 mg/dL — ABNORMAL HIGH (ref 65–99)
POTASSIUM: 4.4 mmol/L (ref 3.5–5.2)
Sodium: 142 mmol/L (ref 134–144)
TOTAL PROTEIN: 6.9 g/dL (ref 6.0–8.5)

## 2016-08-06 ENCOUNTER — Other Ambulatory Visit: Payer: Self-pay | Admitting: Family Medicine

## 2016-08-06 DIAGNOSIS — I10 Essential (primary) hypertension: Secondary | ICD-10-CM

## 2016-08-26 ENCOUNTER — Telehealth: Payer: Self-pay | Admitting: Family Medicine

## 2016-08-26 ENCOUNTER — Other Ambulatory Visit: Payer: Self-pay | Admitting: Family Medicine

## 2016-08-26 DIAGNOSIS — E785 Hyperlipidemia, unspecified: Secondary | ICD-10-CM

## 2016-08-26 NOTE — Telephone Encounter (Signed)
Called Pt to schedule AWV with [facility] for October 23 or 30 -knb

## 2016-10-01 ENCOUNTER — Ambulatory Visit (INDEPENDENT_AMBULATORY_CARE_PROVIDER_SITE_OTHER): Payer: Medicare Other | Admitting: Family Medicine

## 2016-10-01 ENCOUNTER — Encounter: Payer: Self-pay | Admitting: Family Medicine

## 2016-10-01 DIAGNOSIS — I10 Essential (primary) hypertension: Secondary | ICD-10-CM

## 2016-10-01 MED ORDER — VALSARTAN 160 MG PO TABS: 160.0000 mg | ORAL_TABLET | Freq: Every day | ORAL | 0 refills | Status: DC

## 2016-10-01 NOTE — Progress Notes (Signed)
Name: Susan Summers   MRN: [id number]    DOB: 1941-09-07   Date:10/01/2016       Progress Note  Subjective  Chief Complaint  Chief Complaint  Patient presents with  . Hypertension    Has been checking at home with high readings up to 0000000 systolic. Also pharmacy has changed distributer of valsartan.    Hypertension  This is a chronic problem. The problem has been gradually worsening since onset. The problem is uncontrolled. Pertinent negatives include no blurred vision, chest pain, headaches, palpitations or shortness of breath. Past treatments include angiotensin blockers. There are no compliance problems (reports manufacturer/distributor of Valsartan was changed and the new pill iat the same dosage is not working.).  There is no history of kidney disease, CAD/MI or CVA.     Past Medical History:  Diagnosis Date  . Hyperlipidemia   . Hypertension     Past Surgical History:  Procedure Laterality Date  . ABDOMINAL HYSTERECTOMY    . HERNIA REPAIR      Family History  Problem Relation Age of Onset  . Hypertension Mother   . Stroke Mother   . Rheum arthritis Mother   . Diabetes Father   . Cancer Sister     Ovarian  . Diabetes Sister   . Hypertension Brother   . Melanoma Daughter   . Hypertension Son   . Hyperlipidemia Son   . Stroke Brother   . Cancer Brother     Prostate  . Hyperlipidemia Sister     Social History   Social History  . Marital status: Widowed    Spouse name: N/A  . Number of children: N/A  . Years of education: N/A   Occupational History  . Not on file.   Social History Main Topics  . Smoking status: Never Smoker  . Smokeless tobacco: Never Used  . Alcohol use No  . Drug use: No  . Sexual activity: Not Currently   Other Topics Concern  . Not on file   Social History Narrative  . No narrative on file     Current Outpatient Prescriptions:  .  Probiotic Product (PROBIOTIC DAILY PO), Take by mouth daily. , Disp: , Rfl:  .   rosuvastatin (CRESTOR) 20 MG tablet, TAKE 1 TABLET BY MOUTH AT BEDTIME, Disp: 90 tablet, Rfl: 1 .  valsartan (DIOVAN) 80 MG tablet, TAKE 2 TABLETS BY MOUTH ONCE DAILY, Disp: 180 tablet, Rfl: 1 .  calcium citrate-vitamin D (CITRACAL+D) 315-200 MG-UNIT per tablet, Take 1 tablet by mouth 2 (two) times daily., Disp: , Rfl:  .  Cholecalciferol (VITAMIN D PO), Take 2,000 Units by mouth daily. , Disp: , Rfl:  .  ELDERBERRY PO, Take 1,250 mg by mouth daily. , Disp: , Rfl:  .  meloxicam (MOBIC) 15 MG tablet, Take 1 tablet (15 mg total) by mouth daily. (Patient not taking: Reported on 10/01/2016), Disp: 30 tablet, Rfl: 3 .  Omega-3 Fatty Acids (FISH OIL) 1000 MG CAPS, Take by mouth daily., Disp: , Rfl:  .  triamcinolone cream (KENALOG) 0.5 %, Apply 1 application topically 3 (three) times daily. (Patient not taking: Reported on 10/01/2016), Disp: 30 g, Rfl: 0  Allergies  Allergen Reactions  . Nickel   . Penicillins      Review of Systems  Eyes: Negative for blurred vision.  Respiratory: Negative for shortness of breath.   Cardiovascular: Negative for chest pain and palpitations.  Neurological: Negative for headaches.    Objective  Vitals:  10/01/16 1131  BP: (!) 158/80  Temp: 98.2 F (36.8 C)  SpO2: 96%  Weight: 184 lb 6.4 oz (83.6 kg)  Height: 5\' 2"  (1.575 m)    Physical Exam  Constitutional: She is oriented to person, place, and time and well-developed, well-nourished, and in no distress.  Cardiovascular: Normal rate, regular rhythm and normal heart sounds.   No murmur heard. Pulmonary/Chest: Effort normal and breath sounds normal. She has no wheezes.  Musculoskeletal: She exhibits no edema.  Neurological: She is alert and oriented to person, place, and time.  Psychiatric: Mood, memory, affect and judgment normal.  Nursing note and vitals reviewed.     Assessment & Plan  1. Benign hypertension Blood pressure on manual repeat check is 139/82, we'll change to valsartan 160 mg  tablet and asked her to fill it at a different pharmacy. Check BP at night and follow-up for review of logs. - valsartan (DIOVAN) 160 MG tablet; Take 1 tablet (160 mg total) by mouth daily.  Dispense: 90 tablet; Refill: 0   Susan Summers [facility] 10/01/2016 11:53 AM

## 2016-10-07 ENCOUNTER — Encounter: Payer: Self-pay | Admitting: Emergency Medicine

## 2016-10-07 ENCOUNTER — Emergency Department
Admission: EM | Admit: 2016-10-07 | Discharge: 2016-10-07 | Disposition: A | Discharge status: Home / Self Care | Payer: Medicare Other | Attending: Emergency Medicine | Admitting: Emergency Medicine

## 2016-10-07 DIAGNOSIS — I159 Secondary hypertension, unspecified: Secondary | ICD-10-CM | POA: Insufficient documentation

## 2016-10-07 DIAGNOSIS — Z79899 Other long term (current) drug therapy: Secondary | ICD-10-CM | POA: Insufficient documentation

## 2016-10-07 DIAGNOSIS — I10 Essential (primary) hypertension: Secondary | ICD-10-CM | POA: Diagnosis present

## 2016-10-07 NOTE — ED Notes (Signed)
Pt reports hypertension - she went to [facility] Nov 21st - the pharmacy switched distributors and the medication was not working with the new pill - fire department took BP today 202/118 and they advised pt to go to PCP - she could not get appt until tomorrow so she came to the er - pt denies headaches/change in vision/nausea/vomiting

## 2016-10-07 NOTE — Discharge Instructions (Signed)
Please seek medical attention for any high fevers, chest pain, shortness of breath, change in behavior, persistent vomiting, bloody stool or any other new or concerning symptoms.  

## 2016-10-07 NOTE — ED Triage Notes (Signed)
Reports having a bp med change recently so she has been taking her bp at home and today at the fire dept it was 202/118 so they recommended she come here.  Pt denies ha, dizziness, blurred vision or any other sx.  Skin w/d.

## 2016-10-07 NOTE — ED Provider Notes (Signed)
[facility] Emergency Department Provider Note   ____________________________________________   I have reviewed the triage vital signs and the nursing notes.   HISTORY  Chief Complaint Hypertension   History limited by: Not Limited   HPI Susan Summers is a 75 y.o. female who presents to the emergency department today at the request of the fire department because of elevated blood pressure. The patient recently had a switch of her blood pressure medication. Since that time and is been running slightly high. She went to the fire department today to see if she would get the same blood pressure readings on their machine as she was getting on her home machine. The patient denies any headache or chest pain.   Past Medical History:  Diagnosis Date  . Hyperlipidemia   . Hypertension     Patient Active Problem List   Diagnosis Date Noted  . Dermatitis 01/10/2016  . Hyperglycemia 11/14/2015  . Swelling of both ankles 08/09/2015  . Benign hypertension 04/25/2015  . HLD (hyperlipidemia) 04/25/2015  . Adiposity 04/25/2015  . Plantar fasciitis 04/25/2015    Past Surgical History:  Procedure Laterality Date  . ABDOMINAL HYSTERECTOMY    . HERNIA REPAIR      Prior to Admission medications   Medication Sig Start Date End Date Taking? Authorizing Provider  calcium citrate-vitamin D (CITRACAL+D) 315-200 MG-UNIT per tablet Take 1 tablet by mouth 2 (two) times daily.    Roselee Nova, MD  Cholecalciferol (VITAMIN D PO) Take 2,000 Units by mouth daily.     Roselee Nova, MD  ELDERBERRY PO Take 1,250 mg by mouth daily.     Roselee Nova, MD  meloxicam (MOBIC) 15 MG tablet Take 1 tablet (15 mg total) by mouth daily. Patient not taking: Reported on 10/01/2016 03/15/15   Max T Hyatt, DPM  Omega-3 Fatty Acids (FISH OIL) 1000 MG CAPS Take by mouth daily.    Roselee Nova, MD  Probiotic Product (PROBIOTIC DAILY PO) Take by mouth daily.     Roselee Nova, MD   rosuvastatin (CRESTOR) 20 MG tablet TAKE 1 TABLET BY MOUTH AT BEDTIME 08/26/16   Roselee Nova, MD  triamcinolone cream (KENALOG) 0.5 % Apply 1 application topically 3 (three) times daily. Patient not taking: Reported on 10/01/2016 01/10/16   Roselee Nova, MD  valsartan (DIOVAN) 160 MG tablet Take 1 tablet (160 mg total) by mouth daily. 10/01/16   Roselee Nova, MD    Allergies Nickel and Penicillins  Family History  Problem Relation Age of Onset  . Hypertension Mother   . Stroke Mother   . Rheum arthritis Mother   . Diabetes Father   . Cancer Sister     Ovarian  . Diabetes Sister   . Hypertension Brother   . Melanoma Daughter   . Hypertension Son   . Hyperlipidemia Son   . Stroke Brother   . Cancer Brother     Prostate  . Hyperlipidemia Sister     Social History Social History  Substance Use Topics  . Smoking status: Never Smoker  . Smokeless tobacco: Never Used  . Alcohol use No    Review of Systems  Constitutional: Negative for fever. Cardiovascular: Negative for chest pain. Respiratory: Negative for shortness of breath. Gastrointestinal: Negative for abdominal pain, vomiting and diarrhea. Musculoskeletal: Negative for back pain. Neurological: Negative for headaches, focal weakness or numbness.  10-point ROS otherwise negative.  ____________________________________________  PHYSICAL EXAM:  VITAL SIGNS: ED Triage Vitals  Enc Vitals Group     BP 10/07/16 1509 (!) 189/75     Pulse Rate 10/07/16 1509 80     Resp 10/07/16 1509 20     Temp 10/07/16 1509 97.6 F (36.4 C)     Temp Source 10/07/16 1509 Oral     SpO2 10/07/16 1509 97 %     Weight 10/07/16 1510 184 lb (83.5 kg)     Height 10/07/16 1510 5\' 2"  (1.575 m)     Head Circumference --      Peak Flow --      Pain Score 10/07/16 1554 0   Constitutional: Alert and oriented. Well appearing and in no distress. Eyes: Conjunctivae are normal. Normal extraocular movements. ENT   Head:  Normocephalic and atraumatic.   Nose: No congestion/rhinnorhea.   Mouth/Throat: Mucous membranes are moist.   Neck: No stridor. Hematological/Lymphatic/Immunilogical: No cervical lymphadenopathy. Cardiovascular: Normal rate, regular rhythm.  No murmurs, rubs, or gallops.  Respiratory: Normal respiratory effort without tachypnea nor retractions. Breath sounds are clear and equal bilaterally. No wheezes/rales/rhonchi. Gastrointestinal: Soft and nontender. No distention.  Genitourinary: Deferred Musculoskeletal: Normal range of motion in all extremities. No lower extremity edema. Neurologic:  Normal speech and language. No gross focal neurologic deficits are appreciated.  Skin:  Skin is warm, dry and intact. No rash noted. Psychiatric: Mood and affect are normal. Speech and behavior are normal. Patient exhibits appropriate insight and judgment.  ____________________________________________    LABS (pertinent positives/negatives)  None  ____________________________________________   EKG  None  ____________________________________________    RADIOLOGY  None  ____________________________________________   PROCEDURES  Procedures  ____________________________________________   INITIAL IMPRESSION / ASSESSMENT AND PLAN / ED COURSE  Pertinent labs & imaging results that were available during my care of the patient were reviewed by me and considered in my medical decision making (see chart for details).  Patient here with asymptomatic hypertension. She does have a primary care doctor who she is following for her hypertension. At this point given lack of symptoms I feel she is safe for discharge. Will have patient up with primary care. Discussed return precautions.   ____________________________________________   FINAL CLINICAL IMPRESSION(S) / ED DIAGNOSES  Final diagnoses:  Secondary hypertension     Note: This dictation was prepared with Dragon dictation.  Any transcriptional errors that result from this process are unintentional    Nance Pear, MD 10/07/16 1719

## 2016-10-08 ENCOUNTER — Ambulatory Visit (INDEPENDENT_AMBULATORY_CARE_PROVIDER_SITE_OTHER): Payer: Medicare Other | Admitting: Family Medicine

## 2016-10-08 ENCOUNTER — Encounter: Payer: Self-pay | Admitting: Family Medicine

## 2016-10-08 DIAGNOSIS — I10 Essential (primary) hypertension: Secondary | ICD-10-CM

## 2016-10-08 MED ORDER — VALSARTAN 320 MG PO TABS: 320.0000 mg | ORAL_TABLET | Freq: Every day | ORAL | 1 refills | Status: DC

## 2016-10-08 NOTE — Progress Notes (Signed)
Name: Susan Summers   MRN: [id number]    DOB: August 29, 1941   Date:10/08/2016       Progress Note  Subjective  Chief Complaint  Chief Complaint  Patient presents with  . Acute Visit    Elevated BP    Hypertension  This is a chronic problem. The problem has been gradually worsening since onset. The problem is uncontrolled. Pertinent negatives include no blurred vision, chest pain, headaches, palpitations or shortness of breath. Past treatments include angiotensin blockers. There are no compliance problems.  There is no history of kidney disease, CAD/MI or CVA.     Past Medical History:  Diagnosis Date  . Hyperlipidemia   . Hypertension     Past Surgical History:  Procedure Laterality Date  . ABDOMINAL HYSTERECTOMY    . HERNIA REPAIR      Family History  Problem Relation Age of Onset  . Hypertension Mother   . Stroke Mother   . Rheum arthritis Mother   . Diabetes Father   . Cancer Sister     Ovarian  . Diabetes Sister   . Hypertension Brother   . Melanoma Daughter   . Hypertension Son   . Hyperlipidemia Son   . Stroke Brother   . Cancer Brother     Prostate  . Hyperlipidemia Sister     Social History   Social History  . Marital status: Widowed    Spouse name: N/A  . Number of children: N/A  . Years of education: N/A   Occupational History  . Not on file.   Social History Main Topics  . Smoking status: Never Smoker  . Smokeless tobacco: Never Used  . Alcohol use No  . Drug use: No  . Sexual activity: Not Currently   Other Topics Concern  . Not on file   Social History Narrative  . No narrative on file     Current Outpatient Prescriptions:  .  calcium citrate-vitamin D (CITRACAL+D) 315-200 MG-UNIT per tablet, Take 1 tablet by mouth 2 (two) times daily., Disp: , Rfl:  .  Probiotic Product (PROBIOTIC DAILY PO), Take by mouth daily. , Disp: , Rfl:  .  rosuvastatin (CRESTOR) 20 MG tablet, TAKE 1 TABLET BY MOUTH AT BEDTIME, Disp: 90 tablet, Rfl: 1 .   valsartan (DIOVAN) 160 MG tablet, Take 1 tablet (160 mg total) by mouth daily., Disp: 90 tablet, Rfl: 0 .  Cholecalciferol (VITAMIN D PO), Take 2,000 Units by mouth daily. , Disp: , Rfl:  .  ELDERBERRY PO, Take 1,250 mg by mouth daily. , Disp: , Rfl:  .  meloxicam (MOBIC) 15 MG tablet, Take 1 tablet (15 mg total) by mouth daily. (Patient not taking: Reported on 10/08/2016), Disp: 30 tablet, Rfl: 3 .  Omega-3 Fatty Acids (FISH OIL) 1000 MG CAPS, Take by mouth daily., Disp: , Rfl:  .  triamcinolone cream (KENALOG) 0.5 %, Apply 1 application topically 3 (three) times daily. (Patient not taking: Reported on 10/08/2016), Disp: 30 g, Rfl: 0  Allergies  Allergen Reactions  . Nickel   . Penicillins      Review of Systems  Eyes: Negative for blurred vision.  Respiratory: Negative for shortness of breath.   Cardiovascular: Negative for chest pain and palpitations.  Neurological: Negative for headaches.    Objective  Vitals:   10/08/16 1104  BP: (!) 168/78  Pulse: 69  Resp: 16  Temp: 98.3 F (36.8 C)  TempSrc: Oral  SpO2: 98%  Weight: 185 lb 8 oz (84.1  kg)  Height: 5\' 2"  (1.575 m)    Physical Exam  Constitutional: She is oriented to person, place, and time and well-developed, well-nourished, and in no distress.  HENT:  Head: Normocephalic and atraumatic.  Cardiovascular: Normal rate, regular rhythm and normal heart sounds.   No murmur heard. Pulmonary/Chest: Effort normal and breath sounds normal. She has no wheezes.  Neurological: She is alert and oriented to person, place, and time.  Psychiatric: Mood, memory, affect and judgment normal.  Nursing note and vitals reviewed.    Assessment & Plan  1. Benign hypertension ER note reviewed, we will increase Diovan to 320 mg to treat elevated blood pressure. Advised on dietary and lifestyle modifications including the need to increase physical activity. - valsartan (DIOVAN) 320 MG tablet; Take 1 tablet (320 mg total) by mouth  daily.  Dispense: 90 tablet; Refill: 1   Cylinda Santoli [facility] 10/08/2016 11:23 AM

## 2016-11-13 ENCOUNTER — Ambulatory Visit (INDEPENDENT_AMBULATORY_CARE_PROVIDER_SITE_OTHER): Payer: Medicare Other | Admitting: Family Medicine

## 2016-11-13 ENCOUNTER — Encounter: Payer: Self-pay | Admitting: Family Medicine

## 2016-11-13 DIAGNOSIS — E785 Hyperlipidemia, unspecified: Secondary | ICD-10-CM

## 2016-11-13 DIAGNOSIS — R7303 Prediabetes: Secondary | ICD-10-CM | POA: Diagnosis not present

## 2016-11-13 DIAGNOSIS — I10 Essential (primary) hypertension: Secondary | ICD-10-CM

## 2016-11-13 LAB — POCT GLYCOSYLATED HEMOGLOBIN (HGB A1C): Hemoglobin A1C: 5.8

## 2016-11-13 MED ORDER — VALSARTAN 320 MG PO TABS: 320.0000 mg | ORAL_TABLET | Freq: Every day | ORAL | 1 refills | Status: DC

## 2016-11-13 MED ORDER — ROSUVASTATIN CALCIUM 20 MG PO TABS: 20.0000 mg | ORAL_TABLET | Freq: Every day | ORAL | 1 refills | Status: DC

## 2016-11-13 NOTE — Progress Notes (Signed)
Name: Susan Summers   MRN: [id number]    DOB: March 01, 1941   Date:11/13/2016       Progress Note  Subjective  Chief Complaint  Chief Complaint  Patient presents with  . Follow-up    6 mo  . Medication Refill    Hyperlipidemia  This is a chronic problem. The problem is controlled. Recent lipid tests were reviewed and are normal. Pertinent negatives include no chest pain, leg pain, myalgias or shortness of breath. Current antihyperlipidemic treatment includes statins.  Hypertension  This is a chronic problem. The problem is unchanged. The problem is controlled. Pertinent negatives include no blurred vision, chest pain, headaches, palpitations or shortness of breath. Past treatments include angiotensin blockers. There is no history of kidney disease, CAD/MI or CVA.      Past Medical History:  Diagnosis Date  . Hyperlipidemia   . Hypertension     Past Surgical History:  Procedure Laterality Date  . ABDOMINAL HYSTERECTOMY    . HERNIA REPAIR      Family History  Problem Relation Age of Onset  . Hypertension Mother   . Stroke Mother   . Rheum arthritis Mother   . Diabetes Father   . Cancer Sister     Ovarian  . Diabetes Sister   . Hypertension Brother   . Melanoma Daughter   . Hypertension Son   . Hyperlipidemia Son   . Stroke Brother   . Cancer Brother     Prostate  . Hyperlipidemia Sister     Social History   Social History  . Marital status: Widowed    Spouse name: N/A  . Number of children: N/A  . Years of education: N/A   Occupational History  . Not on file.   Social History Main Topics  . Smoking status: Never Smoker  . Smokeless tobacco: Never Used  . Alcohol use No  . Drug use: No  . Sexual activity: Not Currently   Other Topics Concern  . Not on file   Social History Narrative  . No narrative on file     Current Outpatient Prescriptions:  .  calcium citrate-vitamin D (CITRACAL+D) 315-200 MG-UNIT per tablet, Take 1 tablet by mouth 2 (two)  times daily., Disp: , Rfl:  .  Cholecalciferol (VITAMIN D PO), Take 2,000 Units by mouth daily. , Disp: , Rfl:  .  ELDERBERRY PO, Take 1,250 mg by mouth daily. , Disp: , Rfl:  .  Omega-3 Fatty Acids (FISH OIL) 1000 MG CAPS, Take by mouth daily., Disp: , Rfl:  .  Probiotic Product (PROBIOTIC DAILY PO), Take by mouth daily. , Disp: , Rfl:  .  rosuvastatin (CRESTOR) 20 MG tablet, TAKE 1 TABLET BY MOUTH AT BEDTIME, Disp: 90 tablet, Rfl: 1 .  valsartan (DIOVAN) 320 MG tablet, Take 1 tablet (320 mg total) by mouth daily., Disp: 90 tablet, Rfl: 1 .  triamcinolone cream (KENALOG) 0.5 %, Apply 1 application topically 3 (three) times daily. (Patient not taking: Reported on 11/13/2016), Disp: 30 g, Rfl: 0  Allergies  Allergen Reactions  . Nickel   . Penicillins      Review of Systems  Eyes: Negative for blurred vision.  Respiratory: Negative for shortness of breath.   Cardiovascular: Negative for chest pain and palpitations.  Musculoskeletal: Negative for myalgias.  Neurological: Negative for headaches.      Objective  Vitals:   11/13/16 0822  BP: 138/68  Pulse: 72  Resp: 16  Temp: 97.7 F (36.5 C)  TempSrc:  Oral  SpO2: 96%  Weight: 184 lb 9.6 oz (83.7 kg)  Height: 5\' 2"  (1.575 m)    Physical Exam  Constitutional: She is oriented to person, place, and time and well-developed, well-nourished, and in no distress.  HENT:  Head: Normocephalic and atraumatic.  Cardiovascular: Normal rate, regular rhythm and normal heart sounds.   No murmur heard. Pulmonary/Chest: Effort normal and breath sounds normal. She has no wheezes.  Musculoskeletal: Normal range of motion. She exhibits no edema or tenderness.       Right ankle: She exhibits no swelling.       Left ankle: She exhibits no swelling.  Neurological: She is alert and oriented to person, place, and time.  Skin: Skin is warm and dry.  Nursing note and vitals reviewed.   Assessment & Plan  1. Benign hypertension  - valsartan  (DIOVAN) 320 MG tablet; Take 1 tablet (320 mg total) by mouth daily.  Dispense: 90 tablet; Refill: 1  2. Hyperlipidemia, unspecified hyperlipidemia type  - rosuvastatin (CRESTOR) 20 MG tablet; Take 1 tablet (20 mg total) by mouth at bedtime.  Dispense: 90 tablet; Refill: 1 - Lipid Profile - COMPLETE METABOLIC PANEL WITH GFR  3. Prediabetes Point-of-care A1c is 5.8%, stable - POCT HgB A1C   Susan Summers [facility] 11/13/2016 8:38 AM

## 2016-11-14 LAB — LIPID PANEL
CHOL/HDL RATIO: 3.2 ratio (ref <5.0)
Cholesterol: 124 mg/dL (ref <200)
HDL: 39 mg/dL — ABNORMAL LOW (ref >50)
LDL Cholesterol: 58 mg/dL (ref <100)
TRIGLYCERIDES: 137 mg/dL (ref <150)
VLDL: 27 mg/dL (ref <30)

## 2016-11-14 LAB — COMPLETE METABOLIC PANEL WITH GFR
ALBUMIN: 4.7 g/dL (ref 3.6–5.1)
ALT: 17 U/L (ref 6–29)
AST: 17 U/L (ref 10–35)
Alkaline Phosphatase: 79 U/L (ref 33–130)
BILIRUBIN TOTAL: 0.7 mg/dL (ref 0.2–1.2)
BUN: 10 mg/dL (ref 7–25)
CALCIUM: 9.4 mg/dL (ref 8.6–10.4)
CO2: 29 mmol/L (ref 20–31)
Chloride: 106 mmol/L (ref 98–110)
Creat: 0.7 mg/dL (ref 0.60–0.93)
GFR, EST NON AFRICAN AMERICAN: 86 mL/min (ref >=60)
GLUCOSE: 106 mg/dL — AB (ref 65–99)
POTASSIUM: 4.3 mmol/L (ref 3.5–5.3)
SODIUM: 141 mmol/L (ref 135–146)
Total Protein: 7.1 g/dL (ref 6.1–8.1)

## 2016-12-16 ENCOUNTER — Ambulatory Visit (INDEPENDENT_AMBULATORY_CARE_PROVIDER_SITE_OTHER): Payer: Medicare Other

## 2016-12-16 VITALS — BP 150/80 | HR 76 | Temp 97.0°F | Ht 62.0 in | Wt 188.2 lb

## 2016-12-16 DIAGNOSIS — Z0000 Encounter for general adult medical examination without abnormal findings: Secondary | ICD-10-CM | POA: Diagnosis not present

## 2016-12-16 DIAGNOSIS — Z23 Encounter for immunization: Secondary | ICD-10-CM

## 2016-12-16 NOTE — Progress Notes (Signed)
Subjective:   Susan Summers is a 76 y.o. female who presents for Medicare Annual (Subsequent) preventive examination.  Review of Systems:  N/A  Cardiac Risk Factors include: advanced age (>22men, >56 women);dyslipidemia;hypertension;obesity (BMI >30kg/m2)     Objective:     Vitals: BP (!) 150/80 (BP Location: Left Arm)   Pulse 76   Temp 97 F (36.1 C) (Oral)   Ht 5\' 2"  (1.575 m)   Wt 188 lb 3.2 oz (85.4 kg)   BMI 34.42 kg/m   Body mass index is 34.42 kg/m.   Tobacco History  Smoking Status  . Never Smoker  Smokeless Tobacco  . Never Used     Counseling given: Not Answered   Past Medical History:  Diagnosis Date  . Hyperlipidemia   . Hypertension    Past Surgical History:  Procedure Laterality Date  . ABDOMINAL HYSTERECTOMY    . HERNIA REPAIR     Family History  Problem Relation Age of Onset  . Hypertension Mother   . Stroke Mother   . Rheum arthritis Mother   . Diabetes Father   . Cancer Sister     Ovarian  . Diabetes Sister   . Hypertension Brother   . Melanoma Daughter   . Hypertension Son   . Hyperlipidemia Son   . Stroke Brother   . Cancer Brother     Prostate  . Other Brother     bowel obstruction  . Hyperlipidemia Sister   . Cancer Sister     skin- SCC removed   History  Sexual Activity  . Sexual activity: Not Currently    Outpatient Encounter Prescriptions as of 12/16/2016  Medication Sig  . Cholecalciferol (VITAMIN D PO) Take 4,000 Units by mouth daily.   Marland Kitchen ELDERBERRY PO Take 1,250 mg by mouth daily.   . Garlic 123XX123 MG TABS Take by mouth daily.  . Omega-3 Fatty Acids (FISH OIL) 1000 MG CAPS Take by mouth daily.  . Probiotic Product (PROBIOTIC DAILY PO) Take by mouth daily.   . rosuvastatin (CRESTOR) 20 MG tablet Take 1 tablet (20 mg total) by mouth at bedtime.  . valsartan (DIOVAN) 320 MG tablet Take 1 tablet (320 mg total) by mouth daily.  . calcium citrate-vitamin D (CITRACAL+D) 315-200 MG-UNIT per tablet Take 1 tablet by mouth  2 (two) times daily.  Marland Kitchen triamcinolone cream (KENALOG) 0.5 % Apply 1 application topically 3 (three) times daily. (Patient not taking: Reported on 11/13/2016)   No facility-administered encounter medications on file as of 12/16/2016.     Activities of Daily Living In your present state of health, do you have any difficulty performing the following activities: 12/16/2016 11/13/2016  Hearing? N N  Vision? N Y  Difficulty concentrating or making decisions? N N  Walking or climbing stairs? N N  Dressing or bathing? N N  Doing errands, shopping? N N  Preparing Food and eating ? N -  Using the Toilet? N -  In the past six months, have you accidently leaked urine? N -  Do you have problems with loss of bowel control? N -  Managing your Medications? N -  Managing your Finances? N -  Housekeeping or managing your Housekeeping? N -  Some recent data might be hidden    Patient Care Team: Roselee Nova, MD as PCP - General (Family Medicine) Ronnell Freshwater, MD as Referring Physician (Ophthalmology)    Assessment:     Exercise Activities and Dietary recommendations Current Exercise Habits:  Home exercise routine, Type of exercise: Other - see comments;walking;stretching (elliptical, bowling, exercise tapes), Time (Minutes): 30, Frequency (Times/Week): 2, Weekly Exercise (Minutes/Week): 60, Intensity: Mild, Exercise limited by: None identified  Goals    . Exercise           Starting 12/16/16, I will start exercising 3 days week for 30 minutes.       Fall Risk Fall Risk  12/16/2016 11/13/2016 10/08/2016 10/01/2016 05/13/2016  Falls in the past year? Yes No No No No  Number falls in past yr: 1 - - - -  Injury with Fall? No - - - -   Depression Screen PHQ 2/9 Scores 12/16/2016 11/13/2016 10/08/2016 10/01/2016  PHQ - 2 Score 0 0 0 0     Cognitive Function     6CIT Screen 12/16/2016  What Year? 0 points  What month? 0 points  What time? 0 points  Count back from 20 0 points  Months in  reverse 0 points  Repeat phrase 0 points  Total Score 0    Immunization History  Administered Date(s) Administered  . Influenza-Unspecified 07/12/2014, 08/02/2015, 08/12/2016  . Pneumococcal Conjugate-13 07/26/2014  . Pneumococcal Polysaccharide-23 12/16/2016  . Td 11/12/2003  . Tdap 08/12/2016   Screening Tests Health Maintenance  Topic Date Due  . MAMMOGRAM  02/28/2017  . COLONOSCOPY  06/11/2021  . TETANUS/TDAP  08/12/2026  . INFLUENZA VACCINE  Completed  . DEXA SCAN  Completed  . ZOSTAVAX  Addressed  . PNA vac Low Risk Adult  Completed      Plan:  I have personally reviewed and addressed the Medicare Annual Wellness questionnaire and have noted the following in the patient's chart:  A. Medical and social history B. Use of alcohol, tobacco or illicit drugs  C. Current medications and supplements D. Functional ability and status E.  Nutritional status F.  Physical activity G. Advance directives H. List of other physicians I.  Hospitalizations, surgeries, and ER visits in previous 12 months J.  [place] such as hearing and vision if needed, cognitive and depression L. Referrals and appointments - none  In addition, I have reviewed and discussed with patient certain preventive protocols, quality metrics, and best practice recommendations. A written personalized care plan for preventive services as well as general preventive health recommendations were provided to patient.  See attached scanned questionnaire for additional information.   Signed,  Fabio Neighbors, LPN Nurse Health Advisor   MD Recommendations: None  I, as supervising physician, have reviewed the nurse health advisor's Medicare Wellness Visit note for this patient and concur with the findings and recommendations listed above.  Signed Syed Asad A. Manuella Ghazi MD Attending Physician.

## 2016-12-16 NOTE — Patient Instructions (Signed)
Health Maintenance, Female Introduction Adopting a healthy lifestyle and getting preventive care can go a long way to promote health and wellness. Talk with your health care provider about what schedule of regular examinations is right for you. This is a good chance for you to check in with your provider about disease prevention and staying healthy. In between checkups, there are plenty of things you can do on your own. Experts have done a lot of research about which lifestyle changes and preventive measures are most likely to keep you healthy. Ask your health care provider for more information. Weight and diet Eat a healthy diet  Be sure to include plenty of vegetables, fruits, low-fat dairy products, and lean protein.  Do not eat a lot of foods high in solid fats, added sugars, or salt.  Get regular exercise. This is one of the most important things you can do for your health.  Most adults should exercise for at least 150 minutes each week. The exercise should increase your heart rate and make you sweat (moderate-intensity exercise).  Most adults should also do strengthening exercises at least twice a week. This is in addition to the moderate-intensity exercise. Maintain a healthy weight  Body mass index (BMI) is a measurement that can be used to identify possible weight problems. It estimates body fat based on height and weight. Your health care provider can help determine your BMI and help you achieve or maintain a healthy weight.  For females 4 years of age and older:  A BMI below 18.5 is considered underweight.  A BMI of 18.5 to 24.9 is normal.  A BMI of 25 to 29.9 is considered overweight.  A BMI of 30 and above is considered obese. Watch levels of cholesterol and blood lipids  You should start having your blood tested for lipids and cholesterol at 74 years of age, then have this test every 5 years.  You may need to have your cholesterol levels checked more often  if:  Your lipid or cholesterol levels are high.  You are older than 74 years of age.  You are at high risk for heart disease. Cancer screening Lung Cancer  Lung cancer screening is recommended for adults 12-31 years old who are at high risk for lung cancer because of a history of smoking.  A yearly low-dose CT scan of the lungs is recommended for people who:  Currently smoke.  Have quit within the past 15 years.  Have at least a 30-pack-year history of smoking. A pack year is smoking an average of one pack of cigarettes a day for 1 year.  Yearly screening should continue until it has been 15 years since you quit.  Yearly screening should stop if you develop a health problem that would prevent you from having lung cancer treatment. Breast Cancer  Practice breast self-awareness. This means understanding how your breasts normally appear and feel.  It also means doing regular breast self-exams. Let your health care provider know about any changes, no matter how small.  If you are in your 20s or 30s, you should have a clinical breast exam (CBE) by a health care provider every 1-3 years as part of a regular health exam.  If you are 74 or older, have a CBE every year. Also consider having a breast X-ray (mammogram) every year.  If you have a family history of breast cancer, talk to your health care provider about genetic screening.  If you are at high risk for breast cancer,  talk to your health care provider about having an MRI and a mammogram every year.  Breast cancer gene (BRCA) assessment is recommended for women who have family members with BRCA-related cancers. BRCA-related cancers include:  Breast.  Ovarian.  Tubal.  Peritoneal cancers.  Results of the assessment will determine the need for genetic counseling and BRCA1 and BRCA2 testing. Colorectal Cancer  This type of cancer can be detected and often prevented.  Routine colorectal cancer screening usually begins  at 74 years of age and continues through 75 years of age.  Your health care provider may recommend screening at an earlier age if you have risk factors for colon cancer.  Your health care provider may also recommend using home test kits to check for hidden blood in the stool.  A small camera at the end of a tube can be used to examine your colon directly (sigmoidoscopy or colonoscopy). This is done to check for the earliest forms of colorectal cancer.  Routine screening usually begins at age 50.  Direct examination of the colon should be repeated every 5-10 years through 75 years of age. However, you may need to be screened more often if early forms of precancerous polyps or small growths are found. Skin Cancer  Check your skin from head to toe regularly.  Tell your health care provider about any new moles or changes in moles, especially if there is a change in a mole's shape or color.  Also tell your health care provider if you have a mole that is larger than the size of a pencil eraser.  Always use sunscreen. Apply sunscreen liberally and repeatedly throughout the day.  Protect yourself by wearing long sleeves, pants, a wide-brimmed hat, and sunglasses whenever you are outside. Heart disease, diabetes, and high blood pressure  High blood pressure causes heart disease and increases the risk of stroke. High blood pressure is more likely to develop in:  People who have blood pressure in the high end of the normal range (130-139/85-89 mm Hg).  People who are overweight or obese.  People who are African American.  If you are 18-39 years of age, have your blood pressure checked every 3-5 years. If you are 40 years of age or older, have your blood pressure checked every year. You should have your blood pressure measured twice-once when you are at a hospital or clinic, and once when you are not at a hospital or clinic. Record the average of the two measurements. To check your blood pressure  when you are not at a hospital or clinic, you can use:  An automated blood pressure machine at a pharmacy.  A home blood pressure monitor.  If you are between 55 years and 79 years old, ask your health care provider if you should take aspirin to prevent strokes.  Have regular diabetes screenings. This involves taking a blood sample to check your fasting blood sugar level.  If you are at a normal weight and have a low risk for diabetes, have this test once every three years after 74 years of age.  If you are overweight and have a high risk for diabetes, consider being tested at a younger age or more often. Preventing infection Hepatitis B  If you have a higher risk for hepatitis B, you should be screened for this virus. You are considered at high risk for hepatitis B if:  You were born in a country where hepatitis B is common. Ask your health care provider which countries are   considered high risk.  Your parents were born in a high-risk country, and you have not been immunized against hepatitis B (hepatitis B vaccine).  You have HIV or AIDS.  You use needles to inject street drugs.  You live with someone who has hepatitis B.  You have had sex with someone who has hepatitis B.  You get hemodialysis treatment.  You take certain medicines for conditions, including cancer, organ transplantation, and autoimmune conditions. Hepatitis C  Blood testing is recommended for:  Everyone born from 1945 through 1965.  Anyone with known risk factors for hepatitis C. Osteoporosis and menopause  Osteoporosis is a disease in which the bones lose minerals and strength with aging. This can result in serious bone fractures. Your risk for osteoporosis can be identified using a bone density scan.  If you are 65 years of age or older, or if you are at risk for osteoporosis and fractures, ask your health care provider if you should be screened.  Ask your health care provider whether you should take  a calcium or vitamin D supplement to lower your risk for osteoporosis.  Menopause may have certain physical symptoms and risks.  Hormone replacement therapy may reduce some of these symptoms and risks. Talk to your health care provider about whether hormone replacement therapy is right for you. Follow these instructions at home:  Schedule regular health, dental, and eye exams.  Stay current with your immunizations.  Do not use any tobacco products including cigarettes, chewing tobacco, or electronic cigarettes.  If you are pregnant, do not drink alcohol.  If you are breastfeeding, limit how much and how often you drink alcohol.  Limit alcohol intake to no more than 1 drink per day for nonpregnant women. One drink equals 12 ounces of beer, 5 ounces of wine, or 1 ounces of hard liquor.  Do not use street drugs.  Do not share needles.  Ask your health care provider for help if you need support or information about quitting drugs.  Tell your health care provider if you often feel depressed.  Tell your health care provider if you have ever been abused or do not feel safe at home. This information is not intended to replace advice given to you by your health care provider. Make sure you discuss any questions you have with your health care provider. Document Released: 05/13/2011 Document Revised: 04/04/2016 Document Reviewed: 08/01/2015  2017 Elsevier  

## 2016-12-25 IMAGING — MG MM ADDL VIEWS W/ TOMO
6 series · 6 of 14 positions shown · non-contrast
Comparison: Prior exams dating back to 3113

CLINICAL DATA: Screening callback for questioned right breast mass

EXAM:
DIGITAL DIAGNOSTIC right MAMMOGRAM WITH 3D TOMOSYNTHESIS AND CAD

[R MLO synth-2D]
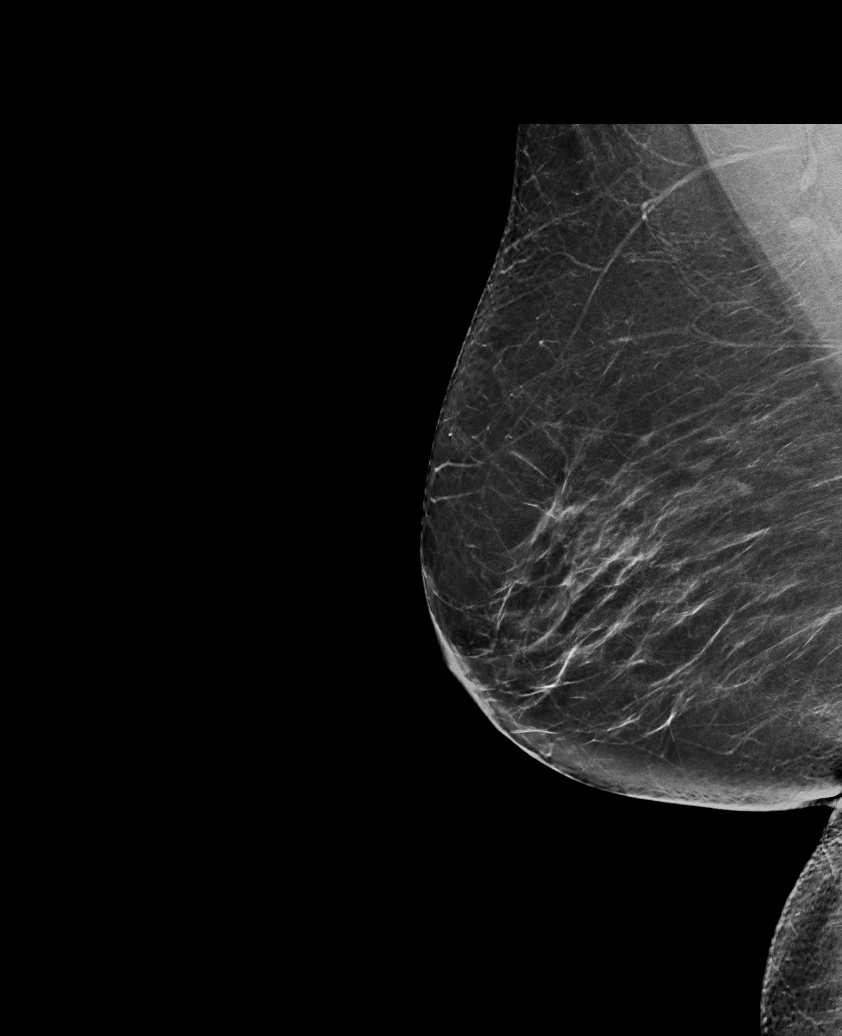

[R MLO]
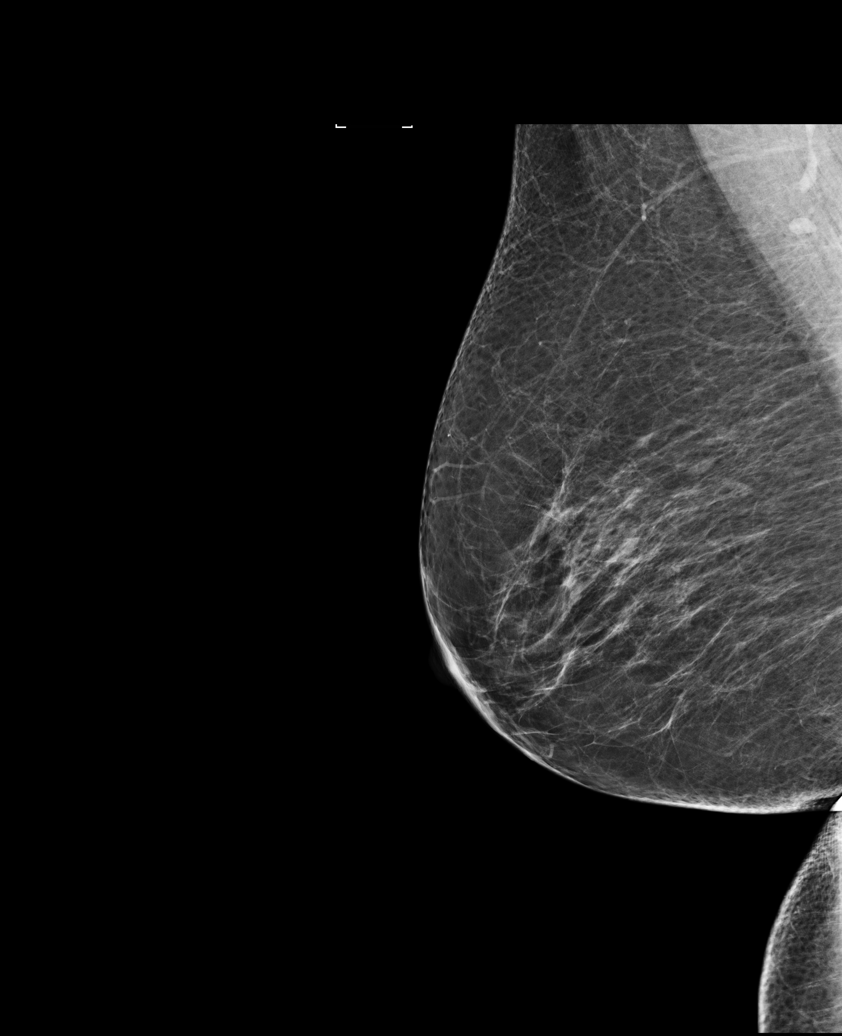

[R CC]
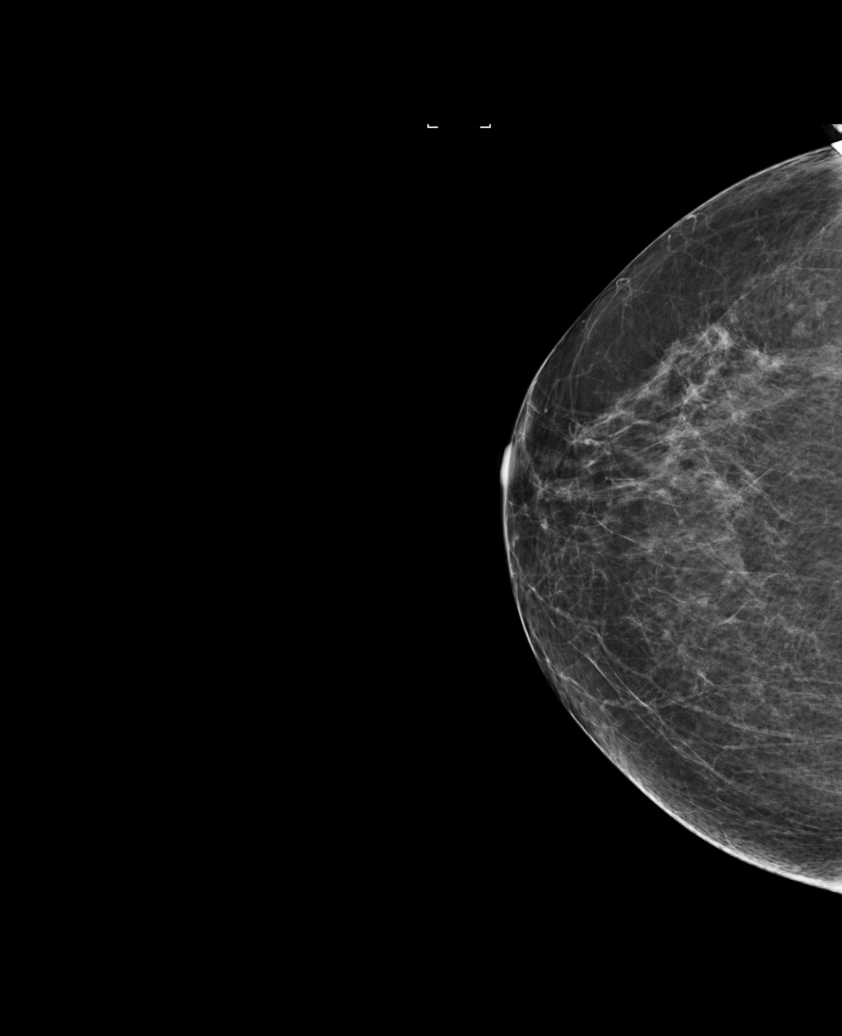

[R CC synth-2D]
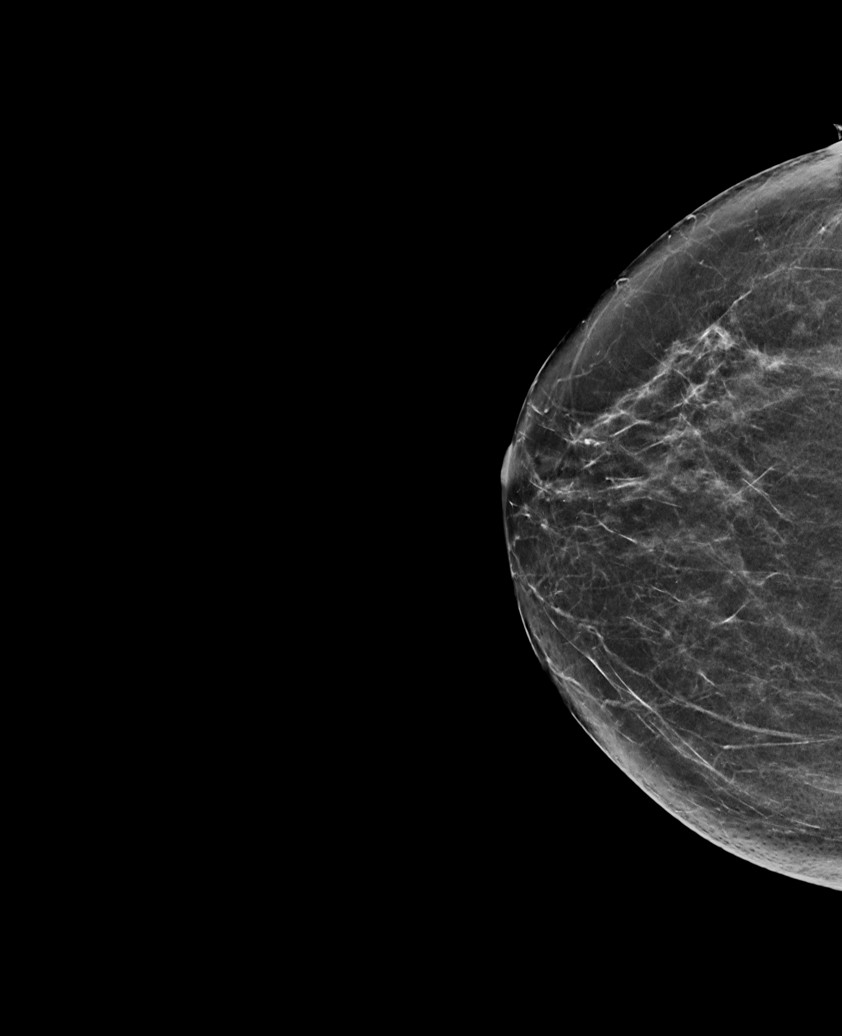

[R CC tomo · tomo slice 33/65.0]
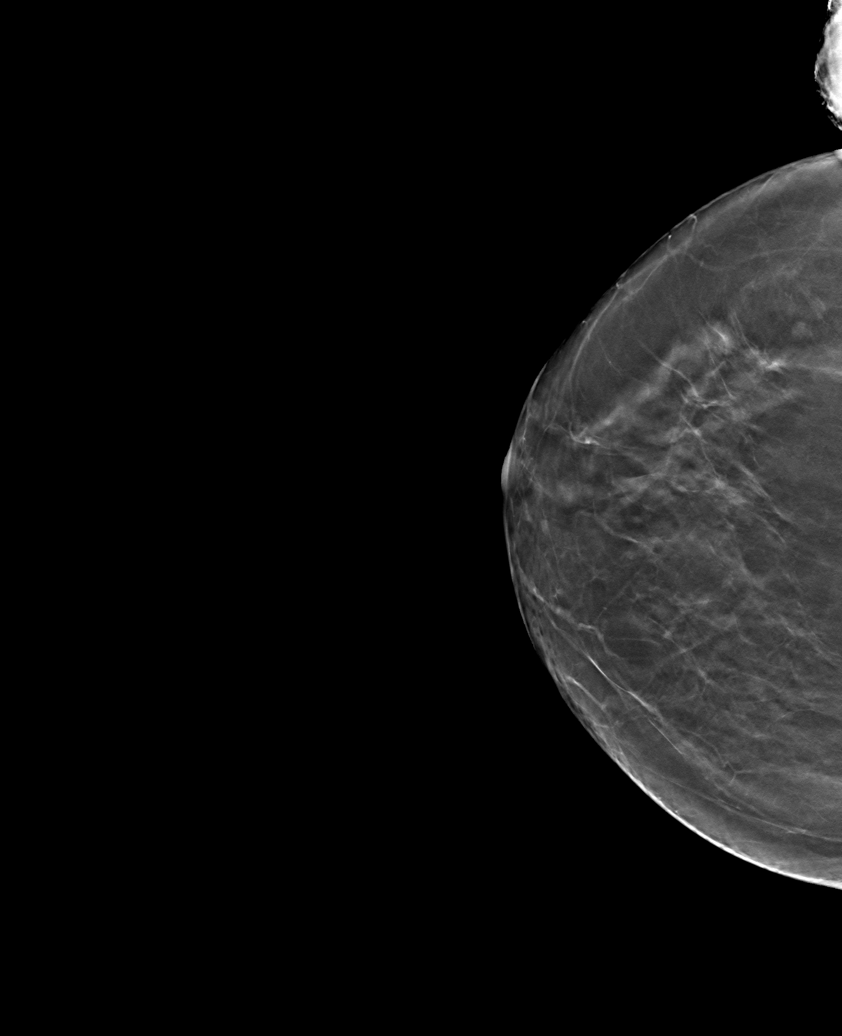

[R MLO tomo · tomo slice 39/77.0]
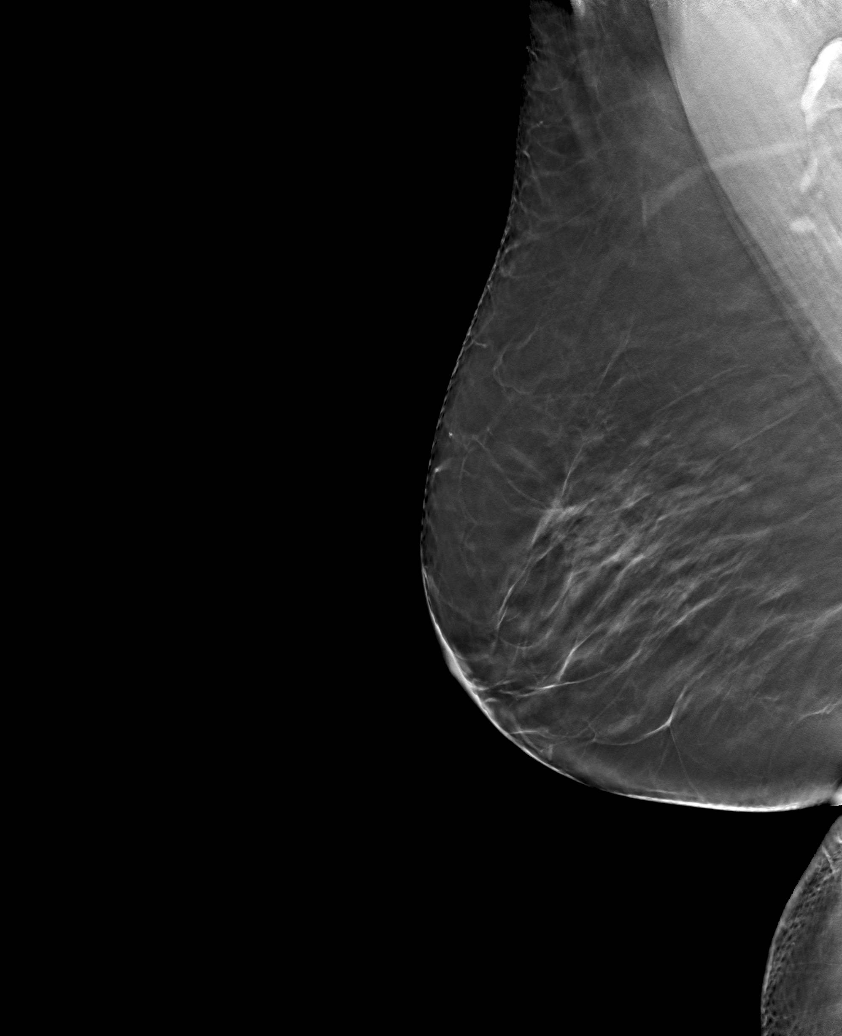

[6 of 14 positions shown; findings below may reference images not displayed]

ACR Breast Density Category b: There are scattered areas of
fibroglandular density.
FINDINGS: The right breast parenchyma is minimally nodular, unchanged. The
previously questioned mass in the right upper outer quadrant has
either significantly decrease in size or resolved, with a probable
benign appearing intramammary lymph node or other benign finding in
the right upper outer quadrant general similar location, unchanged
since 3113 prior exam. No new evidence for malignancy is identified
in the right breast.

Mammographic images were processed with CAD.
IMPRESSION: No evidence for malignancy in the right breast.

RECOMMENDATION:
Screening mammogram in one year.(Code:KC-4-9L3)

I have discussed the findings and recommendations with the patient.
Results were also provided in writing at the conclusion of the
visit. If applicable, a reminder letter will be sent to the patient
regarding the next appointment.

BI-RADS CATEGORY  1: Negative.

## 2017-01-20 ENCOUNTER — Ambulatory Visit: Payer: Self-pay | Admitting: Obstetrics and Gynecology

## 2017-01-22 DIAGNOSIS — H2513 Age-related nuclear cataract, bilateral: Secondary | ICD-10-CM | POA: Diagnosis not present

## 2017-02-10 ENCOUNTER — Ambulatory Visit (INDEPENDENT_AMBULATORY_CARE_PROVIDER_SITE_OTHER): Payer: Medicare Other | Admitting: Family Medicine

## 2017-02-10 VITALS — BP 152/74 | HR 65 | Temp 98.2°F | Resp 16 | Ht 62.0 in | Wt 187.1 lb

## 2017-02-10 DIAGNOSIS — I10 Essential (primary) hypertension: Secondary | ICD-10-CM

## 2017-02-10 DIAGNOSIS — E7800 Pure hypercholesterolemia, unspecified: Secondary | ICD-10-CM

## 2017-02-10 MED ORDER — HYDROCHLOROTHIAZIDE 12.5 MG PO TABS: 12.5000 mg | ORAL_TABLET | Freq: Every day | ORAL | 0 refills | Status: DC

## 2017-02-10 NOTE — Progress Notes (Signed)
Name: Susan Summers   MRN: [id number]    DOB: 1941-03-16   Date:02/10/2017       Progress Note  Subjective  Chief Complaint  Chief Complaint  Patient presents with  . Hypertension    Hypertension  This is a chronic problem. The problem is unchanged. The problem is controlled. Pertinent negatives include no blurred vision, chest pain, headaches, palpitations or shortness of breath. Past treatments include angiotensin blockers. There is no history of kidney disease, CAD/MI or CVA.  Hyperlipidemia  This is a chronic problem. The problem is controlled. Recent lipid tests were reviewed and are normal. Pertinent negatives include no chest pain, leg pain, myalgias or shortness of breath. Current antihyperlipidemic treatment includes statins.     Past Medical History:  Diagnosis Date  . Hyperlipidemia   . Hypertension     Past Surgical History:  Procedure Laterality Date  . ABDOMINAL HYSTERECTOMY    . HERNIA REPAIR      Family History  Problem Relation Age of Onset  . Hypertension Mother   . Stroke Mother   . Rheum arthritis Mother   . Diabetes Father   . Cancer Sister     Ovarian  . Diabetes Sister   . Hypertension Brother   . Melanoma Daughter   . Hypertension Son   . Hyperlipidemia Son   . Stroke Brother   . Cancer Brother     Prostate  . Other Brother     bowel obstruction  . Hyperlipidemia Sister   . Cancer Sister     skin- SCC removed    Social History   Social History  . Marital status: Widowed    Spouse name: N/A  . Number of children: N/A  . Years of education: N/A   Occupational History  . Not on file.   Social History Main Topics  . Smoking status: Never Smoker  . Smokeless tobacco: Never Used  . Alcohol use No  . Drug use: No  . Sexual activity: Not Currently   Other Topics Concern  . Not on file   Social History Narrative  . No narrative on file     Current Outpatient Prescriptions:  .  Cholecalciferol (VITAMIN D PO), Take 4,000 Units  by mouth daily. , Disp: , Rfl:  .  ELDERBERRY PO, Take 1,250 mg by mouth daily. , Disp: , Rfl:  .  Garlic 979 MG TABS, Take by mouth daily., Disp: , Rfl:  .  Omega-3 Fatty Acids (FISH OIL) 1000 MG CAPS, Take by mouth daily., Disp: , Rfl:  .  Probiotic Product (PROBIOTIC DAILY PO), Take by mouth daily. , Disp: , Rfl:  .  rosuvastatin (CRESTOR) 20 MG tablet, Take 1 tablet (20 mg total) by mouth at bedtime., Disp: 90 tablet, Rfl: 1 .  valsartan (DIOVAN) 320 MG tablet, Take 1 tablet (320 mg total) by mouth daily., Disp: 90 tablet, Rfl: 1 .  calcium citrate-vitamin D (CITRACAL+D) 315-200 MG-UNIT per tablet, Take 1 tablet by mouth 2 (two) times daily., Disp: , Rfl:  .  triamcinolone cream (KENALOG) 0.5 %, Apply 1 application topically 3 (three) times daily. (Patient not taking: Reported on 11/13/2016), Disp: 30 g, Rfl: 0  Allergies  Allergen Reactions  . Nickel   . Penicillins      Review of Systems  Eyes: Negative for blurred vision.  Respiratory: Negative for shortness of breath.   Cardiovascular: Negative for chest pain and palpitations.  Musculoskeletal: Negative for myalgias.  Neurological: Negative for headaches.  Objective  Vitals:   02/10/17 0913  BP: (!) 152/80  Pulse: 65  Resp: 16  Temp: 98.2 F (36.8 C)  TempSrc: Oral  SpO2: 98%  Weight: 187 lb 1.6 oz (84.9 kg)  Height: 5\' 2"  (0.263 m)    Physical Exam  Constitutional: She is oriented to person, place, and time and well-developed, well-nourished, and in no distress.  Cardiovascular: Normal rate, regular rhythm and normal heart sounds.   No murmur heard. Pulmonary/Chest: Effort normal and breath sounds normal. She has no wheezes.  Abdominal: Soft. Bowel sounds are normal. There is no tenderness.  Musculoskeletal: She exhibits no edema.  Neurological: She is alert and oriented to person, place, and time.  Psychiatric: Mood, memory, affect and judgment normal.  Nursing note and vitals reviewed.     Recent  Results (from the past 2160 hour(s))  POCT HgB A1C     Status: Normal   Collection Time: 11/13/16  8:59 AM  Result Value Ref Range   Hemoglobin A1C 5.8   Lipid Profile     Status: Abnormal   Collection Time: 11/13/16  9:08 AM  Result Value Ref Range   Cholesterol 124 <200 mg/dL   Triglycerides 137 <150 mg/dL   HDL 39 (L) >50 mg/dL   Total CHOL/HDL Ratio 3.2 <5.0 Ratio   VLDL 27 <30 mg/dL   LDL Cholesterol 58 <100 mg/dL  COMPLETE METABOLIC PANEL WITH GFR     Status: Abnormal   Collection Time: 11/13/16  9:08 AM  Result Value Ref Range   Sodium 141 135 - 146 mmol/L   Potassium 4.3 3.5 - 5.3 mmol/L   Chloride 106 98 - 110 mmol/L   CO2 29 20 - 31 mmol/L   Glucose, Bld 106 (H) 65 - 99 mg/dL   BUN 10 7 - 25 mg/dL   Creat 0.70 0.60 - 0.93 mg/dL    Comment:   For patients > or = 74 years of age: The upper reference limit for Creatinine is approximately 13% higher for people identified as African-American.      Total Bilirubin 0.7 0.2 - 1.2 mg/dL   Alkaline Phosphatase 79 33 - 130 U/L   AST 17 10 - 35 U/L   ALT 17 6 - 29 U/L   Total Protein 7.1 6.1 - 8.1 g/dL   Albumin 4.7 3.6 - 5.1 g/dL   Calcium 9.4 8.6 - 10.4 mg/dL   GFR, Est African American >89 >=60 mL/min   GFR, Est Non African American 86 >=60 mL/min     Assessment & Plan  1. Benign hypertension BP is elevated on multiple rechecks, will add hydrochlorothiazide 12.5 mg to patient's maintenance medication of Diovan 320 mg daily, advised to keep BP logs to review in 3 months - hydrochlorothiazide (HYDRODIURIL) 12.5 MG tablet; Take 1 tablet (12.5 mg total) by mouth daily.  Dispense: 90 tablet; Refill: 0  2. Pure hypercholesterolemia FLP at goal except for below normal HDL, reviewed lab work from January 2018.   Breeze Berringer Asad A. [facility] 02/10/2017 9:29 AM

## 2017-03-12 ENCOUNTER — Encounter: Payer: Self-pay | Admitting: Family Medicine

## 2017-03-12 ENCOUNTER — Ambulatory Visit (INDEPENDENT_AMBULATORY_CARE_PROVIDER_SITE_OTHER): Payer: Medicare Other | Admitting: Family Medicine

## 2017-03-12 VITALS — BP 140/78 | HR 74 | Temp 98.0°F | Resp 16 | Ht 62.0 in | Wt 186.9 lb

## 2017-03-12 DIAGNOSIS — I10 Essential (primary) hypertension: Secondary | ICD-10-CM

## 2017-03-12 NOTE — Progress Notes (Signed)
Name: Susan Summers   MRN: [id number]    DOB: Jan 06, 1941   Date:03/12/2017       Progress Note  Subjective  Chief Complaint  Chief Complaint  Patient presents with  . Follow-up    1 mo    Hypertension  This is a chronic problem. The problem has been gradually improving since onset. The problem is controlled. Pertinent negatives include no blurred vision, chest pain, headaches, palpitations or shortness of breath. Past treatments include angiotensin blockers and diuretics. There are no compliance problems.  There is no history of kidney disease, CAD/MI or CVA.     Past Medical History:  Diagnosis Date  . Hyperlipidemia   . Hypertension     Past Surgical History:  Procedure Laterality Date  . ABDOMINAL HYSTERECTOMY    . HERNIA REPAIR      Family History  Problem Relation Age of Onset  . Hypertension Mother   . Stroke Mother   . Rheum arthritis Mother   . Diabetes Father   . Cancer Sister     Ovarian  . Diabetes Sister   . Hypertension Brother   . Melanoma Daughter   . Hypertension Son   . Hyperlipidemia Son   . Stroke Brother   . Cancer Brother     Prostate  . Other Brother     bowel obstruction  . Hyperlipidemia Sister   . Cancer Sister     skin- SCC removed    Social History   Social History  . Marital status: Widowed    Spouse name: N/A  . Number of children: N/A  . Years of education: N/A   Occupational History  . Not on file.   Social History Main Topics  . Smoking status: Never Smoker  . Smokeless tobacco: Never Used  . Alcohol use No  . Drug use: No  . Sexual activity: Not Currently   Other Topics Concern  . Not on file   Social History Narrative  . No narrative on file     Current Outpatient Prescriptions:  .  calcium citrate-vitamin D (CITRACAL+D) 315-200 MG-UNIT per tablet, Take 1 tablet by mouth 2 (two) times daily., Disp: , Rfl:  .  Cholecalciferol (VITAMIN D PO), Take 4,000 Units by mouth daily. , Disp: , Rfl:  .  ELDERBERRY  PO, Take 1,250 mg by mouth daily. , Disp: , Rfl:  .  Garlic 650 MG TABS, Take by mouth daily., Disp: , Rfl:  .  hydrochlorothiazide (HYDRODIURIL) 12.5 MG tablet, Take 1 tablet (12.5 mg total) by mouth daily., Disp: 90 tablet, Rfl: 0 .  Omega-3 Fatty Acids (FISH OIL) 1000 MG CAPS, Take by mouth daily., Disp: , Rfl:  .  Probiotic Product (PROBIOTIC DAILY PO), Take by mouth daily. , Disp: , Rfl:  .  rosuvastatin (CRESTOR) 20 MG tablet, Take 1 tablet (20 mg total) by mouth at bedtime., Disp: 90 tablet, Rfl: 1 .  valsartan (DIOVAN) 320 MG tablet, Take 1 tablet (320 mg total) by mouth daily., Disp: 90 tablet, Rfl: 1 .  triamcinolone cream (KENALOG) 0.5 %, Apply 1 application topically 3 (three) times daily. (Patient not taking: Reported on 11/13/2016), Disp: 30 g, Rfl: 0  Allergies  Allergen Reactions  . Nickel   . Penicillins      Review of Systems  Eyes: Negative for blurred vision.  Respiratory: Negative for shortness of breath.   Cardiovascular: Negative for chest pain and palpitations.  Neurological: Negative for headaches.     Objective  Vitals:   03/12/17 1408  BP: 140/78  Pulse: 74  Resp: 16  Temp: 98 F (36.7 C)  TempSrc: Oral  SpO2: 97%  Weight: 186 lb 14.4 oz (84.8 kg)  Height: 5\' 2"  (1.575 m)    Physical Exam  Constitutional: She is oriented to person, place, and time and well-developed, well-nourished, and in no distress.  HENT:  Head: Normocephalic and atraumatic.  Cardiovascular: Normal rate, regular rhythm and normal heart sounds.   No murmur heard. Pulmonary/Chest: Effort normal and breath sounds normal. She has no wheezes.  Abdominal: Soft. Bowel sounds are normal. There is no tenderness.  Musculoskeletal: She exhibits no edema.  Neurological: She is alert and oriented to person, place, and time.  Psychiatric: Mood, memory, affect and judgment normal.  Nursing note and vitals reviewed.      Assessment & Plan  1. Benign hypertension BP stable on  present therapy, continue on hydrochlorothiazide, reassess in 3 months    Lulubelle Simcoe Asad A. [facility] 03/12/2017 2:12 PM

## 2017-04-02 ENCOUNTER — Telehealth: Payer: Self-pay | Admitting: Family Medicine

## 2017-04-02 NOTE — Telephone Encounter (Signed)
Pt is needing refills on medications ( on all ) Pharm is [facility] .

## 2017-04-03 ENCOUNTER — Other Ambulatory Visit: Payer: Self-pay | Admitting: Emergency Medicine

## 2017-04-03 DIAGNOSIS — E785 Hyperlipidemia, unspecified: Secondary | ICD-10-CM

## 2017-04-03 DIAGNOSIS — I10 Essential (primary) hypertension: Secondary | ICD-10-CM

## 2017-04-03 MED ORDER — HYDROCHLOROTHIAZIDE 12.5 MG PO TABS: 12.5000 mg | ORAL_TABLET | Freq: Every day | ORAL | 0 refills | Status: DC

## 2017-04-03 MED ORDER — VALSARTAN 320 MG PO TABS: 320.0000 mg | ORAL_TABLET | Freq: Every day | ORAL | 0 refills | Status: DC

## 2017-04-03 MED ORDER — ROSUVASTATIN CALCIUM 20 MG PO TABS: 20.0000 mg | ORAL_TABLET | Freq: Every day | ORAL | 0 refills | Status: DC

## 2017-04-03 NOTE — Telephone Encounter (Signed)
Scripts sent to pharmacy 

## 2017-04-09 DIAGNOSIS — L821 Other seborrheic keratosis: Secondary | ICD-10-CM | POA: Diagnosis not present

## 2017-04-09 DIAGNOSIS — D1801 Hemangioma of skin and subcutaneous tissue: Secondary | ICD-10-CM | POA: Diagnosis not present

## 2017-04-09 DIAGNOSIS — Z85828 Personal history of other malignant neoplasm of skin: Secondary | ICD-10-CM | POA: Diagnosis not present

## 2017-05-06 ENCOUNTER — Other Ambulatory Visit: Payer: Self-pay | Admitting: Family Medicine

## 2017-05-06 ENCOUNTER — Ambulatory Visit (INDEPENDENT_AMBULATORY_CARE_PROVIDER_SITE_OTHER): Payer: Medicare Other | Admitting: Family Medicine

## 2017-05-06 ENCOUNTER — Encounter: Payer: Self-pay | Admitting: Family Medicine

## 2017-05-06 VITALS — BP 142/76 | HR 75 | Temp 98.0°F | Resp 16 | Wt 185.3 lb

## 2017-05-06 DIAGNOSIS — Z1211 Encounter for screening for malignant neoplasm of colon: Secondary | ICD-10-CM

## 2017-05-06 DIAGNOSIS — Z780 Asymptomatic menopausal state: Secondary | ICD-10-CM

## 2017-05-06 DIAGNOSIS — Z01419 Encounter for gynecological examination (general) (routine) without abnormal findings: Secondary | ICD-10-CM

## 2017-05-06 DIAGNOSIS — Z1231 Encounter for screening mammogram for malignant neoplasm of breast: Secondary | ICD-10-CM | POA: Diagnosis not present

## 2017-05-06 DIAGNOSIS — I10 Essential (primary) hypertension: Secondary | ICD-10-CM

## 2017-05-06 DIAGNOSIS — Z1239 Encounter for other screening for malignant neoplasm of breast: Secondary | ICD-10-CM

## 2017-05-06 NOTE — Progress Notes (Signed)
Name: Susan Summers   MRN: [id number]    DOB: June 19, 1941   Date:05/06/2017       Progress Note  Subjective  Chief Complaint  Chief Complaint  Patient presents with  . Annual Exam    2nd part  . Medication Refill    HPI  Pt. Presents for Complete Physical Exam.  Her last colonoscopy was in August 2012, she is overdue, will order Cologuard.  She is due for screening mammogram She is due for BMD, last study showed Osteopenia in spine.    Past Medical History:  Diagnosis Date  . Hyperlipidemia   . Hypertension     Past Surgical History:  Procedure Laterality Date  . ABDOMINAL HYSTERECTOMY    . HERNIA REPAIR      Family History  Problem Relation Age of Onset  . Hypertension Mother   . Stroke Mother   . Rheum arthritis Mother   . Diabetes Father   . Cancer Sister        Ovarian  . Diabetes Sister   . Hypertension Brother   . Melanoma Daughter   . Hypertension Son   . Hyperlipidemia Son   . Stroke Brother   . Cancer Brother        Prostate  . Other Brother        bowel obstruction  . Hyperlipidemia Sister   . Cancer Sister        skin- SCC removed    Social History   Social History  . Marital status: Widowed    Spouse name: N/A  . Number of children: N/A  . Years of education: N/A   Occupational History  . Not on file.   Social History Main Topics  . Smoking status: Never Smoker  . Smokeless tobacco: Never Used  . Alcohol use No  . Drug use: No  . Sexual activity: Not Currently   Other Topics Concern  . Not on file   Social History Narrative  . No narrative on file     Current Outpatient Prescriptions:  .  Cholecalciferol (VITAMIN D PO), Take 4,000 Units by mouth daily. , Disp: , Rfl:  .  ELDERBERRY PO, Take 1,250 mg by mouth daily. , Disp: , Rfl:  .  Garlic 676 MG TABS, Take by mouth daily., Disp: , Rfl:  .  hydrochlorothiazide (HYDRODIURIL) 12.5 MG tablet, Take 1 tablet (12.5 mg total) by mouth daily., Disp: 90 tablet, Rfl: 0 .  Omega-3  Fatty Acids (FISH OIL) 1000 MG CAPS, Take by mouth daily., Disp: , Rfl:  .  Probiotic Product (PROBIOTIC DAILY PO), Take by mouth daily. , Disp: , Rfl:  .  rosuvastatin (CRESTOR) 20 MG tablet, Take 1 tablet (20 mg total) by mouth at bedtime., Disp: 90 tablet, Rfl: 0 .  valsartan (DIOVAN) 320 MG tablet, Take 1 tablet (320 mg total) by mouth daily., Disp: 90 tablet, Rfl: 0  Allergies  Allergen Reactions  . Nickel   . Penicillins      Review of Systems  Constitutional: Negative for chills, fever and malaise/fatigue.  HENT: Negative for congestion and ear discharge.   Eyes: Negative for blurred vision and double vision.  Respiratory: Negative for cough, shortness of breath, wheezing and stridor.   Cardiovascular: Positive for leg swelling (occasional ankle swelling in the heat). Negative for chest pain and PND.  Gastrointestinal: Negative for abdominal pain, blood in stool, constipation, nausea and vomiting.  Genitourinary: Negative for dysuria and hematuria.  Musculoskeletal: Negative for back pain  and neck pain.  Neurological: Negative for dizziness, tingling and headaches.  Psychiatric/Behavioral: Negative for depression. The patient is not nervous/anxious and does not have insomnia.     Objective  Vitals:   05/06/17 0856  BP: (!) 142/76  Pulse: 75  Resp: 16  Temp: 98 F (36.7 C)  TempSrc: Oral  SpO2: 96%  Weight: 185 lb 4.8 oz (84.1 kg)    Physical Exam  Constitutional: She is oriented to person, place, and time and well-developed, well-nourished, and in no distress.  HENT:  Head: Normocephalic and atraumatic.  Right Ear: External ear normal.  Left Ear: External ear normal.  Mouth/Throat: Oropharynx is clear and moist.  Eyes: Pupils are equal, round, and reactive to light.  Neck: Neck supple. No thyromegaly present.  Cardiovascular: Normal rate, regular rhythm and normal heart sounds.   No murmur heard. Pulmonary/Chest: Effort normal and breath sounds normal. She has  no wheezes. Right breast exhibits no mass and no nipple discharge. Left breast exhibits no mass and no nipple discharge.  Abdominal: Soft. Bowel sounds are normal. There is no tenderness.  Musculoskeletal: She exhibits no edema.  Neurological: She is alert and oriented to person, place, and time.  Psychiatric: Mood, memory, affect and judgment normal.  Nursing note and vitals reviewed.       Assessment & Plan  1. Well woman exam with routine gynecological exam Obtain age-appropriate screening lab work - CBC with Differential/Platelet - TSH - VITAMIN D 25 Hydroxy (Vit-D Deficiency, Fractures)  2. Post-menopausal  - DG Bone Density; Future  3. Screening for breast cancer  - MM DIGITAL SCREENING BILATERAL; Future  4. Screening for colon cancer - Cologuard  Delmy Holdren Asad A. [facility] 05/06/2017 9:16 AM

## 2017-05-07 LAB — CBC WITH DIFFERENTIAL/PLATELET
BASOS: 0 %
Basophils Absolute: 0 10*3/uL (ref 0.0–0.2)
EOS (ABSOLUTE): 0.1 10*3/uL (ref 0.0–0.4)
EOS: 2 %
HEMATOCRIT: 38.3 % (ref 34.0–46.6)
HEMOGLOBIN: 13 g/dL (ref 11.1–15.9)
Immature Grans (Abs): 0 10*3/uL (ref 0.0–0.1)
Immature Granulocytes: 0 %
LYMPHS ABS: 2 10*3/uL (ref 0.7–3.1)
Lymphs: 28 %
MCH: 28.6 pg (ref 26.6–33.0)
MCHC: 33.9 g/dL (ref 31.5–35.7)
MCV: 84 fL (ref 79–97)
MONOCYTES: 7 %
Monocytes Absolute: 0.5 10*3/uL (ref 0.1–0.9)
NEUTROS ABS: 4.4 10*3/uL (ref 1.4–7.0)
Neutrophils: 63 %
Platelets: 196 10*3/uL (ref 150–379)
RBC: 4.54 x10E6/uL (ref 3.77–5.28)
RDW: 14.3 % (ref 12.3–15.4)
WBC: 7 10*3/uL (ref 3.4–10.8)

## 2017-05-07 LAB — VITAMIN D 25 HYDROXY (VIT D DEFICIENCY, FRACTURES): VIT D 25 HYDROXY: 45.6 ng/mL (ref 30.0–100.0)

## 2017-05-07 LAB — TSH: TSH: 3.31 u[IU]/mL (ref 0.450–4.500)

## 2017-05-19 DIAGNOSIS — Z1211 Encounter for screening for malignant neoplasm of colon: Secondary | ICD-10-CM | POA: Diagnosis not present

## 2017-05-19 DIAGNOSIS — Z1212 Encounter for screening for malignant neoplasm of rectum: Secondary | ICD-10-CM | POA: Diagnosis not present

## 2017-05-22 LAB — COLOGUARD: Cologuard: NEGATIVE

## 2017-07-17 ENCOUNTER — Ambulatory Visit
Admission: RE | Admit: 2017-07-17 | Discharge: 2017-07-17 | Disposition: A | Discharge status: Home / Self Care | Payer: Medicare Other | Source: Ambulatory Visit | Attending: Family Medicine | Admitting: Family Medicine

## 2017-07-17 DIAGNOSIS — Z1239 Encounter for other screening for malignant neoplasm of breast: Secondary | ICD-10-CM

## 2017-07-17 DIAGNOSIS — Z780 Asymptomatic menopausal state: Secondary | ICD-10-CM | POA: Diagnosis not present

## 2017-07-17 DIAGNOSIS — Z1231 Encounter for screening mammogram for malignant neoplasm of breast: Secondary | ICD-10-CM | POA: Insufficient documentation

## 2017-07-17 DIAGNOSIS — M8589 Other specified disorders of bone density and structure, multiple sites: Secondary | ICD-10-CM | POA: Diagnosis not present

## 2017-08-05 ENCOUNTER — Other Ambulatory Visit: Payer: Self-pay | Admitting: Family Medicine

## 2017-08-05 DIAGNOSIS — I10 Essential (primary) hypertension: Secondary | ICD-10-CM

## 2017-08-07 ENCOUNTER — Encounter: Payer: Self-pay | Admitting: Family Medicine

## 2017-08-07 ENCOUNTER — Ambulatory Visit (INDEPENDENT_AMBULATORY_CARE_PROVIDER_SITE_OTHER): Payer: Medicare Other | Admitting: Family Medicine

## 2017-08-07 VITALS — BP 136/74 | HR 78 | Temp 98.0°F | Resp 14 | Ht 62.0 in | Wt 189.9 lb

## 2017-08-07 DIAGNOSIS — I10 Essential (primary) hypertension: Secondary | ICD-10-CM

## 2017-08-07 DIAGNOSIS — G8929 Other chronic pain: Secondary | ICD-10-CM | POA: Diagnosis not present

## 2017-08-07 DIAGNOSIS — M545 Low back pain, unspecified: Secondary | ICD-10-CM

## 2017-08-07 DIAGNOSIS — E785 Hyperlipidemia, unspecified: Secondary | ICD-10-CM

## 2017-08-07 DIAGNOSIS — E7800 Pure hypercholesterolemia, unspecified: Secondary | ICD-10-CM | POA: Diagnosis not present

## 2017-08-07 DIAGNOSIS — Z23 Encounter for immunization: Secondary | ICD-10-CM | POA: Diagnosis not present

## 2017-08-07 MED ORDER — ROSUVASTATIN CALCIUM 20 MG PO TABS: 20.0000 mg | ORAL_TABLET | Freq: Every day | ORAL | 0 refills | Status: DC

## 2017-08-07 MED ORDER — HYDROCHLOROTHIAZIDE 12.5 MG PO TABS: 12.5000 mg | ORAL_TABLET | Freq: Every day | ORAL | 0 refills | Status: DC

## 2017-08-07 MED ORDER — LOSARTAN POTASSIUM 100 MG PO TABS: 100.0000 mg | ORAL_TABLET | Freq: Every day | ORAL | 0 refills | Status: DC

## 2017-08-07 MED ORDER — MELOXICAM 15 MG PO TABS: 15.0000 mg | ORAL_TABLET | Freq: Every day | ORAL | 0 refills | Status: DC

## 2017-08-07 NOTE — Progress Notes (Signed)
Name: Susan Summers   MRN: [id number]    DOB: [birth year]   Date:08/07/2017       Progress Note  Subjective  Chief Complaint  Chief Complaint  Patient presents with  . Medication Refill    all meds  . Follow-up    3 months    Hypertension  This is a chronic problem. The problem has been gradually improving since onset. The problem is controlled. Pertinent negatives include no blurred vision, chest pain, headaches, palpitations or shortness of breath. Past treatments include angiotensin blockers and diuretics. There are no compliance problems.  There is no history of kidney disease, CAD/MI or CVA.  Hyperlipidemia  This is a chronic problem. The problem is controlled. Recent lipid tests were reviewed and are normal. Pertinent negatives include no chest pain, leg pain, myalgias or shortness of breath. Current antihyperlipidemic treatment includes statins and diet change.  Back Pain  This is a new problem. The pain is present in the lumbar spine. The quality of the pain is described as aching and shooting. The pain is at a severity of 5/10. The symptoms are aggravated by bending (activity such as bowling or yard work). Pertinent negatives include no bladder incontinence, bowel incontinence, chest pain, headaches, leg pain, numbness or paresis. She has tried analgesics (has used Meloxicam in the past) for the symptoms.    Past Medical History:  Diagnosis Date  . Hyperlipidemia   . Hypertension     Past Surgical History:  Procedure Laterality Date  . ABDOMINAL HYSTERECTOMY    . HERNIA REPAIR      Family History  Problem Relation Age of Onset  . Hypertension Mother   . Stroke Mother   . Rheum arthritis Mother   . Diabetes Father   . Cancer Sister        Ovarian  . Diabetes Sister   . Hypertension Brother   . Melanoma Daughter   . Hypertension Son   . Hyperlipidemia Son   . Stroke Brother   . Cancer Brother        Prostate  . Other Brother        bowel obstruction  .  Hyperlipidemia Sister   . Cancer Sister        skin- SCC removed    Social History   Social History  . Marital status: Widowed    Spouse name: N/A  . Number of children: N/A  . Years of education: N/A   Occupational History  . Not on file.   Social History Main Topics  . Smoking status: Never Smoker  . Smokeless tobacco: Never Used  . Alcohol use No  . Drug use: No  . Sexual activity: Not Currently   Other Topics Concern  . Not on file   Social History Narrative  . No narrative on file     Current Outpatient Prescriptions:  .  amLODipine (NORVASC) 5 MG tablet, Take 5 mg by mouth daily., Disp: , Rfl:  .  Cholecalciferol (VITAMIN D PO), Take 4,000 Units by mouth daily. , Disp: , Rfl:  .  ELDERBERRY PO, Take 1,250 mg by mouth daily. , Disp: , Rfl:  .  Garlic 350 MG TABS, Take by mouth daily., Disp: , Rfl:  .  hydrochlorothiazide (HYDRODIURIL) 12.5 MG tablet, Take 1 tablet (12.5 mg total) by mouth daily., Disp: 90 tablet, Rfl: 0 .  Omega-3 Fatty Acids (FISH OIL) 1000 MG CAPS, Take by mouth daily., Disp: , Rfl:  .  Probiotic Product (PROBIOTIC  DAILY PO), Take by mouth daily. , Disp: , Rfl:  .  rosuvastatin (CRESTOR) 20 MG tablet, Take 1 tablet (20 mg total) by mouth at bedtime., Disp: 90 tablet, Rfl: 0 .  valsartan (DIOVAN) 320 MG tablet, Take 1 tablet (320 mg total) by mouth daily. (Patient not taking: Reported on 08/07/2017), Disp: 90 tablet, Rfl: 0  Allergies  Allergen Reactions  . Nickel   . Penicillins      Review of Systems  Eyes: Negative for blurred vision.  Respiratory: Negative for shortness of breath.   Cardiovascular: Negative for chest pain and palpitations.  Gastrointestinal: Negative for bowel incontinence.  Genitourinary: Negative for bladder incontinence.  Musculoskeletal: Positive for back pain. Negative for myalgias.  Neurological: Negative for numbness and headaches.     Objective  Vitals:   08/07/17 0917  BP: 136/74  Pulse: 78  Resp: 14   Temp: 98 F (36.7 C)  TempSrc: Oral  SpO2: 96%  Weight: 189 lb 14.4 oz (86.1 kg)  Height: 5\' 2"  (1.575 m)    Physical Exam  Constitutional: She is oriented to person, place, and time and well-developed, well-nourished, and in no distress.  HENT:  Head: Normocephalic and atraumatic.  Cardiovascular: Normal rate, regular rhythm and normal heart sounds.   Pulmonary/Chest: Effort normal and breath sounds normal. She has no wheezes.  Abdominal: Soft. Bowel sounds are normal. There is no tenderness.  Musculoskeletal: She exhibits no edema.  Neurological: She is alert and oriented to person, place, and time.  Psychiatric: Mood, memory, affect and judgment normal.  Nursing note and vitals reviewed.      Recent Results (from the past 2160 hour(s))  Cologuard     Status: None   Collection Time: 05/19/17 12:00 AM  Result Value Ref Range   Cologuard Negative      Assessment & Plan  1. Needs flu shot  - Flu vaccine HIGH DOSE PF (Fluzone High dose)  2. Benign hypertension BP stable on present antihypertensive treatment, DC valsartan replacement losartan 100 mg daily - losartan (COZAAR) 100 MG tablet; Take 1 tablet (100 mg total) by mouth daily.  Dispense: 90 tablet; Refill: 0 - hydrochlorothiazide (HYDRODIURIL) 12.5 MG tablet; Take 1 tablet (12.5 mg total) by mouth daily.  Dispense: 90 tablet; Refill: 0  3. Pure hypercholesterolemia Obtain FLP and adjust statin if appropriate - Lipid panel  4. Chronic bilateral low back pain without sciatica Obtain x-rays of lumbar spine for evaluation of possible arthritis, prescription for meloxicam provided - meloxicam (MOBIC) 15 MG tablet; Take 1 tablet (15 mg total) by mouth daily.  Dispense: 30 tablet; Refill: 0 - DG Lumbar Spine Complete; Future  5. Hyperlipidemia, unspecified hyperlipidemia type  - rosuvastatin (CRESTOR) 20 MG tablet; Take 1 tablet (20 mg total) by mouth at bedtime.  Dispense: 90 tablet; Refill: 0   Jedediah Noda Asad A.  [facility] 08/07/2017 9:46 AM

## 2017-08-08 LAB — LIPID PANEL
Chol/HDL Ratio: 3.2 ratio (ref 0.0–4.4)
Cholesterol, Total: 126 mg/dL (ref 100–199)
HDL: 39 mg/dL — AB (ref >39)
LDL Calculated: 47 mg/dL (ref 0–99)
TRIGLYCERIDES: 198 mg/dL — AB (ref 0–149)
VLDL CHOLESTEROL CAL: 40 mg/dL (ref 5–40)

## 2017-11-06 ENCOUNTER — Ambulatory Visit (INDEPENDENT_AMBULATORY_CARE_PROVIDER_SITE_OTHER): Payer: Medicare Other | Admitting: Family Medicine

## 2017-11-06 ENCOUNTER — Encounter: Payer: Self-pay | Admitting: Family Medicine

## 2017-11-06 DIAGNOSIS — E785 Hyperlipidemia, unspecified: Secondary | ICD-10-CM

## 2017-11-06 DIAGNOSIS — I10 Essential (primary) hypertension: Secondary | ICD-10-CM

## 2017-11-06 MED ORDER — HYDROCHLOROTHIAZIDE 12.5 MG PO TABS: 12.5000 mg | ORAL_TABLET | Freq: Every day | ORAL | 0 refills | Status: DC

## 2017-11-06 MED ORDER — LOSARTAN POTASSIUM 100 MG PO TABS: 100.0000 mg | ORAL_TABLET | Freq: Every day | ORAL | 0 refills | Status: DC

## 2017-11-06 MED ORDER — ROSUVASTATIN CALCIUM 20 MG PO TABS
20.0000 mg | ORAL_TABLET | Freq: Every day | ORAL | 0 refills | Status: DC
Start: 2017-11-06 — End: 2018-02-04

## 2017-11-06 NOTE — Progress Notes (Signed)
Name: Susan Summers   MRN: [id number]    DOB: 05-04-1941   Date:11/06/2017       Progress Note  Subjective  Chief Complaint  Chief Complaint  Patient presents with  . Hypertension    f/u  . Hyperlipidemia    f/u  . Medication Refill    Hypertension  This is a chronic problem. The problem is unchanged. The problem is controlled. Pertinent negatives include no blurred vision, chest pain, headaches, palpitations or shortness of breath. Past treatments include angiotensin blockers. There is no history of kidney disease, CAD/MI or CVA.  Hyperlipidemia  This is a chronic problem. The problem is uncontrolled. Recent lipid tests were reviewed and are high (elevated Triglycerides.). Pertinent negatives include no chest pain, leg pain, myalgias or shortness of breath. Current antihyperlipidemic treatment includes statins.    Past Medical History:  Diagnosis Date  . Hyperlipidemia   . Hypertension     Past Surgical History:  Procedure Laterality Date  . ABDOMINAL HYSTERECTOMY    . HERNIA REPAIR      Family History  Problem Relation Age of Onset  . Hypertension Mother   . Stroke Mother   . Rheum arthritis Mother   . Diabetes Father   . Cancer Sister        Ovarian  . Diabetes Sister   . Hypertension Brother   . Melanoma Daughter   . Hypertension Son   . Hyperlipidemia Son   . Stroke Brother   . Cancer Brother        Prostate  . Other Brother        bowel obstruction  . Hyperlipidemia Sister   . Cancer Sister        skin- SCC removed    Social History   Socioeconomic History  . Marital status: Widowed    Spouse name: Not on file  . Number of children: Not on file  . Years of education: Not on file  . Highest education level: Not on file  Social Needs  . Financial resource strain: Not on file  . Food insecurity - worry: Not on file  . Food insecurity - inability: Not on file  . Transportation needs - medical: Not on file  . Transportation needs - non-medical:  Not on file  Occupational History  . Not on file  Tobacco Use  . Smoking status: Never Smoker  . Smokeless tobacco: Never Used  Substance and Sexual Activity  . Alcohol use: No    Alcohol/week: 0.0 oz  . Drug use: No  . Sexual activity: Not Currently  Other Topics Concern  . Not on file  Social History Narrative  . Not on file     Current Outpatient Medications:  .  Cholecalciferol (VITAMIN D PO), Take 4,000 Units by mouth daily. , Disp: , Rfl:  .  ELDERBERRY PO, Take 1,250 mg by mouth daily. , Disp: , Rfl:  .  Garlic 027 MG TABS, Take by mouth daily., Disp: , Rfl:  .  hydrochlorothiazide (HYDRODIURIL) 12.5 MG tablet, Take 1 tablet (12.5 mg total) by mouth daily., Disp: 90 tablet, Rfl: 0 .  losartan (COZAAR) 100 MG tablet, Take 1 tablet (100 mg total) by mouth daily., Disp: 90 tablet, Rfl: 0 .  meloxicam (MOBIC) 15 MG tablet, Take 1 tablet (15 mg total) by mouth daily. (Patient taking differently: Take 15 mg by mouth daily as needed. ), Disp: 30 tablet, Rfl: 0 .  Omega-3 Fatty Acids (FISH OIL) 1000 MG CAPS, Take  by mouth daily., Disp: , Rfl:  .  Probiotic Product (PROBIOTIC DAILY PO), Take by mouth daily. , Disp: , Rfl:  .  rosuvastatin (CRESTOR) 20 MG tablet, Take 1 tablet (20 mg total) by mouth at bedtime., Disp: 90 tablet, Rfl: 0  Allergies  Allergen Reactions  . Nickel   . Penicillins      Review of Systems  Eyes: Negative for blurred vision.  Respiratory: Negative for shortness of breath.   Cardiovascular: Negative for chest pain and palpitations.  Musculoskeletal: Negative for myalgias.  Neurological: Negative for headaches.    Objective  Vitals:   11/06/17 0952  BP: 137/62  Pulse: 74  Resp: 14  Temp: 98.7 F (37.1 C)  TempSrc: Oral  SpO2: 99%  Weight: 191 lb (86.6 kg)    Physical Exam  Constitutional: She is oriented to person, place, and time and well-developed, well-nourished, and in no distress.  HENT:  Head: Normocephalic and atraumatic.   Musculoskeletal:       Right ankle: She exhibits swelling.       Left ankle: She exhibits swelling.  Trace bilateral pitting edema.    Neurological: She is alert and oriented to person, place, and time.  Psychiatric: Mood, memory, affect and judgment normal.  Nursing note and vitals reviewed.    Assessment & Plan  1. Benign hypertension BP stable on present antihypertensive treatment - hydrochlorothiazide (HYDRODIURIL) 12.5 MG tablet; Take 1 tablet (12.5 mg total) by mouth daily.  Dispense: 90 tablet; Refill: 0 - losartan (COZAAR) 100 MG tablet; Take 1 tablet (100 mg total) by mouth daily.  Dispense: 90 tablet; Refill: 0  2. Hyperlipidemia, unspecified hyperlipidemia type Elevated triglycerides fromlab work in September 2018, continue on statin, recheck in 3 months - rosuvastatin (CRESTOR) 20 MG tablet; Take 1 tablet (20 mg total) by mouth at bedtime.  Dispense: 90 tablet; Refill: 0   Aravind Chrismer Asad A. [facility] 11/06/2017 10:08 AM

## 2017-11-13 DIAGNOSIS — S76311A Strain of muscle, fascia and tendon of the posterior muscle group at thigh level, right thigh, initial encounter: Secondary | ICD-10-CM | POA: Diagnosis not present

## 2017-12-09 ENCOUNTER — Other Ambulatory Visit: Payer: Self-pay

## 2017-12-09 ENCOUNTER — Telehealth: Payer: Self-pay | Admitting: Family Medicine

## 2017-12-09 ENCOUNTER — Telehealth: Payer: Self-pay

## 2017-12-09 DIAGNOSIS — I10 Essential (primary) hypertension: Secondary | ICD-10-CM

## 2017-12-09 MED ORDER — TELMISARTAN 80 MG PO TABS: 80.0000 mg | ORAL_TABLET | Freq: Every day | ORAL | 0 refills | Status: DC

## 2017-12-09 NOTE — Telephone Encounter (Signed)
Copied from [place] [id number]. Topic: General - Other >> Dec 09, 2017  1:12 PM Neva Seat wrote: Pt was told by pharmacy that her lot of Rx was not affected that it will be ok for her to take the Rx and that she will not need the new Rx.  Please call pt back to discuss.

## 2017-12-09 NOTE — Telephone Encounter (Signed)
Losartan Bp is on recall and Dr. Manuella Ghazi would like to change the pt to Micardis 80 mg tab once daily. Left a message to ask pt if she is ok to start this bp meds and if so which pharmacy to send it to.

## 2017-12-09 NOTE — Telephone Encounter (Signed)
Spoke to the patient and she states she want start her new insurance until

## 2017-12-09 NOTE — Telephone Encounter (Signed)
Spoke to the patient and I mention to the patient that we received a letter from her [facility] [employer] mentioning that consumer- level recall of some lots of losartan tablets. She states that the pharmacy states its not that particular losartan medication that was infected. I suggest to just to be on the safe side due to the letter we received to start on the new Bp meds. Pt agreed.

## 2017-12-09 NOTE — Telephone Encounter (Signed)
Pt will not start her new insurance until Friday 12/12/2017 and she wanted to wait until then to fill it. Pt asked would she be able to get a couple to last her until Friday. I suggest to call her pharmacy and see how much would it cost to get couple of pills because we do not want her not to take her Bp and  also to keep taking losartan due to the recall. I also suggest to use her current insurance to pay for the New Bp meds; pt denied and stated she would rather wait until the new insurance but she will get a couple of the new Bp meds until she can fill the medicine with her new insurance.

## 2017-12-19 ENCOUNTER — Ambulatory Visit (INDEPENDENT_AMBULATORY_CARE_PROVIDER_SITE_OTHER): Payer: Medicare Other

## 2017-12-19 VITALS — BP 140/68 | HR 68 | Temp 97.0°F | Resp 12 | Ht 62.0 in | Wt 190.9 lb

## 2017-12-19 DIAGNOSIS — Z0000 Encounter for general adult medical examination without abnormal findings: Secondary | ICD-10-CM | POA: Diagnosis not present

## 2017-12-19 NOTE — Patient Instructions (Signed)
Susan Summers , Thank you for taking time to come for your Medicare Wellness Visit. I appreciate your ongoing commitment to your health goals. Please review the following plan we discussed and let me know if I can assist you in the future.   Screening recommendations/referrals: Colorectal Screening: Completed Cologuard 05/19/17. Repeat every 3 years. Mammogram: Completed 07/17/17. Repeat every year. Bone Density: Completed 07/17/17. Osteoporotic screenings no longer required. Lung Cancer Screening: You do not qualify for this screening Hepatitis C Screening: You do not qualify for this screening HIV/Syphilis/Hepatitis B Screening: You do not qualify for this screening   Vision/Dental Exams: Recommended yearly ophthalmology/optometry visit for glaucoma screening and checkup Recommended yearly dental visit for hygiene and checkup  Vaccinations: Influenza vaccine: Completed 08/07/17 Pneumococcal vaccine: Completed PCV13 07/26/14 and PPSV23 12/16/16 Tdap vaccine: Completed 08/12/16 Shingles vaccine: Please call your insurance company to determine your out of pocket expense for the Shingrix vaccine. You may also receive this vaccine at your local pharmacy or Health Dept.    Advanced directives: Please bring a copy of your POA (Power of Attorney) and/or Living Will to your next appointment.  Conditions/risks identified: Recommend to drink at least 6-8 8oz glasses of water per day.  Next appointment: You are scheduled to see Dr. Sanda Klein on 02/04/18 @ 8:20am.   Please schedule your Annual Wellness Visit with your Nurse Health Advisor in one year.  Preventive Care 19 Years and Older, Female Preventive care refers to lifestyle choices and visits with your health care provider that can promote health and wellness. What does preventive care include?  A yearly physical exam. This is also called an annual well check.  Dental exams once or twice a year.  Routine eye exams. Ask your health care provider how often  you should have your eyes checked.  Personal lifestyle choices, including:  Daily care of your teeth and gums.  Regular physical activity.  Eating a healthy diet.  Avoiding tobacco and drug use.  Limiting alcohol use.  Practicing safe sex.  Taking low-dose aspirin every day.  Taking vitamin and mineral supplements as recommended by your health care provider. What happens during an annual well check? The services and screenings done by your health care provider during your annual well check will depend on your age, overall health, lifestyle risk factors, and family history of disease. Counseling  Your health care provider may ask you questions about your:  Alcohol use.  Tobacco use.  Drug use.  Emotional well-being.  Home and relationship well-being.  Sexual activity.  Eating habits.  History of falls.  Memory and ability to understand (cognition).  Work and work Statistician.  Reproductive health. Screening  You may have the following tests or measurements:  Height, weight, and BMI.  Blood pressure.  Lipid and cholesterol levels. These may be checked every 5 years, or more frequently if you are over 26 years old.  Skin check.  Lung cancer screening. You may have this screening every year starting at age 60 if you have a 30-pack-year history of smoking and currently smoke or have quit within the past 15 years.  Fecal occult blood test (FOBT) of the stool. You may have this test every year starting at age 61.  Flexible sigmoidoscopy or colonoscopy. You may have a sigmoidoscopy every 5 years or a colonoscopy every 10 years starting at age 13.  Hepatitis C blood test.  Hepatitis B blood test.  Sexually transmitted disease (STD) testing.  Diabetes screening. This is done by checking your  blood sugar (glucose) after you have not eaten for a while (fasting). You may have this done every 1-3 years.  Bone density scan. This is done to screen for  osteoporosis. You may have this done starting at age 65.  Mammogram. This may be done every 1-2 years. Talk to your health care provider about how often you should have regular mammograms. Talk with your health care provider about your test results, treatment options, and if necessary, the need for more tests. Vaccines  Your health care provider may recommend certain vaccines, such as:  Influenza vaccine. This is recommended every year.  Tetanus, diphtheria, and acellular pertussis (Tdap, Td) vaccine. You may need a Td booster every 10 years.  Zoster vaccine. You may need this after age 34.  Pneumococcal 13-valent conjugate (PCV13) vaccine. One dose is recommended after age 79.  Pneumococcal polysaccharide (PPSV23) vaccine. One dose is recommended after age 50. Talk to your health care provider about which screenings and vaccines you need and how often you need them. This information is not intended to replace advice given to you by your health care provider. Make sure you discuss any questions you have with your health care provider. Document Released: 11/24/2015 Document Revised: 07/17/2016 Document Reviewed: 08/29/2015 Elsevier Interactive Patient Education  2017 [place] Prevention in the Home Falls can cause injuries. They can happen to people of all ages. There are many things you can do to make your home safe and to help prevent falls. What can I do on the outside of my home?  Regularly fix the edges of walkways and driveways and fix any cracks.  Remove anything that might make you trip as you walk through a door, such as a raised step or threshold.  Trim any bushes or trees on the path to your home.  Use bright outdoor lighting.  Clear any walking paths of anything that might make someone trip, such as rocks or tools.  Regularly check to see if handrails are loose or broken. Make sure that both sides of any steps have handrails.  Any raised decks and porches  should have guardrails on the edges.  Have any leaves, snow, or ice cleared regularly.  Use sand or salt on walking paths during winter.  Clean up any spills in your garage right away. This includes oil or grease spills. What can I do in the bathroom?  Use night lights.  Install grab bars by the toilet and in the tub and shower. Do not use towel bars as grab bars.  Use non-skid mats or decals in the tub or shower.  If you need to sit down in the shower, use a plastic, non-slip stool.  Keep the floor dry. Clean up any water that spills on the floor as soon as it happens.  Remove soap buildup in the tub or shower regularly.  Attach bath mats securely with double-sided non-slip rug tape.  Do not have throw rugs and other things on the floor that can make you trip. What can I do in the bedroom?  Use night lights.  Make sure that you have a light by your bed that is easy to reach.  Do not use any sheets or blankets that are too big for your bed. They should not hang down onto the floor.  Have a firm chair that has side arms. You can use this for support while you get dressed.  Do not have throw rugs and other things on the floor that  can make you trip. What can I do in the kitchen?  Clean up any spills right away.  Avoid walking on wet floors.  Keep items that you use a lot in easy-to-reach places.  If you need to reach something above you, use a strong step stool that has a grab bar.  Keep electrical cords out of the way.  Do not use floor polish or wax that makes floors slippery. If you must use wax, use non-skid floor wax.  Do not have throw rugs and other things on the floor that can make you trip. What can I do with my stairs?  Do not leave any items on the stairs.  Make sure that there are handrails on both sides of the stairs and use them. Fix handrails that are broken or loose. Make sure that handrails are as long as the stairways.  Check any carpeting to  make sure that it is firmly attached to the stairs. Fix any carpet that is loose or worn.  Avoid having throw rugs at the top or bottom of the stairs. If you do have throw rugs, attach them to the floor with carpet tape.  Make sure that you have a light switch at the top of the stairs and the bottom of the stairs. If you do not have them, ask someone to add them for you. What else can I do to help prevent falls?  Wear shoes that:  Do not have high heels.  Have rubber bottoms.  Are comfortable and fit you well.  Are closed at the toe. Do not wear sandals.  If you use a stepladder:  Make sure that it is fully opened. Do not climb a closed stepladder.  Make sure that both sides of the stepladder are locked into place.  Ask someone to hold it for you, if possible.  Clearly mark and make sure that you can see:  Any grab bars or handrails.  First and last steps.  Where the edge of each step is.  Use tools that help you move around (mobility aids) if they are needed. These include:  Canes.  Walkers.  Scooters.  Crutches.  Turn on the lights when you go into a dark area. Replace any light bulbs as soon as they burn out.  Set up your furniture so you have a clear path. Avoid moving your furniture around.  If any of your floors are uneven, fix them.  If there are any pets around you, be aware of where they are.  Review your medicines with your doctor. Some medicines can make you feel dizzy. This can increase your chance of falling. Ask your doctor what other things that you can do to help prevent falls. This information is not intended to replace advice given to you by your health care provider. Make sure you discuss any questions you have with your health care provider. Document Released: 08/24/2009 Document Revised: 04/04/2016 Document Reviewed: 12/02/2014 Elsevier Interactive Patient Education  2017 [employer].

## 2017-12-19 NOTE — Progress Notes (Signed)
Subjective:   Susan Summers is a 77 y.o. female who presents for Medicare Annual (Subsequent) preventive examination.  Review of Systems:  N/A Cardiac Risk Factors include: dyslipidemia;hypertension;obesity (BMI >30kg/m2);advanced age (>39men, >25 women)     Objective:     Vitals: BP 140/68 (BP Location: Left Arm, Patient Position: Sitting, Cuff Size: Large)   Pulse 68   Temp (!) 97 F (36.1 C) (Oral)   Resp 12   Ht 5\' 2"  (1.575 m)   Wt 190 lb 14.4 oz (86.6 kg)   BMI 34.92 kg/m   Body mass index is 34.92 kg/m.  Advanced Directives 12/19/2017 08/07/2017 03/12/2017 02/10/2017 12/16/2016 11/13/2016 10/08/2016  Does Patient Have a Medical Advance Directive? Yes Yes Yes Yes Yes Yes Yes  Type of Paramedic of [place];Living will - Living will Living will Living will Living will Living will  Does patient want to make changes to medical advance directive? - - - - - - -  Copy of [place] in Chart? No - copy requested - - - - - -  Would patient like information on creating a medical advance directive? - - - - - - -   Pt has been provided with the "MOST" and "DNR" documents for her review. Pt has been advised that she will only need to complete the document that is most appropriate to suit her health care wishes. Once completed, pt has been advised to return the appropriate document to the office for the physician to review and sign. Verbalized acceptance and understanding.  Tobacco Social History   Tobacco Use  Smoking Status Never Smoker  Smokeless Tobacco Never Used  Tobacco Comment   smoking cessation materials not required     Counseling given: No Comment: smoking cessation materials not required   Clinical Intake:  Pre-visit preparation completed: No  Pain : No/denies pain   BMI - recorded: 34.92 Nutritional Status: BMI > 30  Obese Has the patient had any N/V/D within the last 2 months?  No Does the patient have any non-healing  wounds?  No Has the patient had any unintentional weight loss or weight gain?  No Diabetes: No  How often do you need to have someone help you when you read instructions, pamphlets, or other written materials from your doctor or pharmacy?: 1 - Never  Interpreter Needed?: No  Information entered by :: AEVersole, LPN  Past Medical History:  Diagnosis Date  . Hyperlipidemia   . Hypertension    Past Surgical History:  Procedure Laterality Date  . ABDOMINAL HYSTERECTOMY    . HERNIA REPAIR     Family History  Problem Relation Age of Onset  . Hypertension Mother   . Stroke Mother   . Rheum arthritis Mother   . Diabetes Father   . Cancer Sister        Ovarian  . Diabetes Sister   . Hypertension Brother   . Melanoma Daughter   . Hypertension Son   . Hyperlipidemia Son   . Stroke Brother   . Cancer Brother        Prostate  . Other Brother        bowel obstruction  . Hyperlipidemia Sister   . Cancer Sister        skin- SCC removed   Social History   Socioeconomic History  . Marital status: Widowed    Spouse name: Zenia Resides  . Number of children: 2  . Years of education: None  .  Highest education level: 12th grade  Social Needs  . Financial resource strain: Not hard at all  . Food insecurity - worry: Never true  . Food insecurity - inability: Never true  . Transportation needs - medical: No  . Transportation needs - non-medical: No  Occupational History  . Occupation: Retired  Tobacco Use  . Smoking status: Never Smoker  . Smokeless tobacco: Never Used  . Tobacco comment: smoking cessation materials not required  Substance and Sexual Activity  . Alcohol use: No    Alcohol/week: 0.0 oz  . Drug use: No  . Sexual activity: Not Currently  Other Topics Concern  . None  Social History Narrative  . None    Outpatient Encounter Medications as of 12/19/2017  Medication Sig  . Cholecalciferol (VITAMIN D PO) Take 4,000 Units by mouth daily.   Marland Kitchen ELDERBERRY PO Take 1,250  mg by mouth daily.   . Garlic 621 MG TABS Take by mouth daily.  . hydrochlorothiazide (HYDRODIURIL) 12.5 MG tablet Take 1 tablet (12.5 mg total) by mouth daily.  . meloxicam (MOBIC) 15 MG tablet Take 1 tablet (15 mg total) by mouth daily. (Patient taking differently: Take 15 mg by mouth daily as needed. )  . Omega-3 Fatty Acids (FISH OIL) 1000 MG CAPS Take by mouth daily.  . Probiotic Product (PROBIOTIC DAILY PO) Take by mouth daily.   . rosuvastatin (CRESTOR) 20 MG tablet Take 1 tablet (20 mg total) by mouth at bedtime.  Marland Kitchen telmisartan (MICARDIS) 80 MG tablet Take 1 tablet (80 mg total) by mouth daily.   No facility-administered encounter medications on file as of 12/19/2017.     Activities of Daily Living In your present state of health, do you have any difficulty performing the following activities: 12/19/2017 11/06/2017  Hearing? N N  Comment denies hearing aids -  Vision? N Y  Comment wears eyeglasses -  Difficulty concentrating or making decisions? Y N  Comment short term memory loss -  Walking or climbing stairs? N N  Dressing or bathing? N N  Doing errands, shopping? N N  Preparing Food and eating ? N -  Comment denies dentures -  Using the Toilet? N -  In the past six months, have you accidently leaked urine? Y -  Comment stress incontinence -  Do you have problems with loss of bowel control? N -  Managing your Medications? N -  Managing your Finances? N -  Housekeeping or managing your Housekeeping? N -  Some recent data might be hidden    Patient Care Team: Roselee Nova, MD as PCP - General (Family Medicine) Karren Burly Deirdre Peer, MD as Referring Physician (Ophthalmology)    Assessment:   This is a routine wellness examination for [place].  Exercise Activities and Dietary recommendations Current Exercise Habits: Structured exercise class, Type of exercise: strength training/weights;walking, Time (Minutes): 60, Frequency (Times/Week): 5, Weekly Exercise  (Minutes/Week): 300, Intensity: Mild, Exercise limited by: None identified  Goals    . DIET - INCREASE WATER INTAKE     Recommend to drink at least 6-8 8oz glasses of water per day.       Fall Risk Fall Risk  12/19/2017 11/06/2017 08/07/2017 05/06/2017 03/12/2017  Falls in the past year? No No No No No  Comment - - - - -  Number falls in past yr: - - - - -  Injury with Fall? - - - - -  Risk for fall due to : History of fall(s);Impaired vision - - - -  Risk for fall due to: Comment fallen in the yard; wears eyeglasses - - - -   Is the patient's home free of loose throw rugs in walkways, pet beds, electrical cords, etc?   Yes Does the patient have any grab bars in the bathroom? Yes  Does the patient use a shower chair when bathing? No Does the patient have any stairs in or around the home? Yes If so, are there any handrails?  Yes Does the patient have adequate lighting?  Yes Does the patient use a cane, walker or w/c? No Does the patient use of an elevated toilet seat? Yes  Timed Get Up and Go Performed: Yes. Pt ambulated 10 feet within 10 sec. Gait stead-fast and without the use of an assistive device No intervention required at this time. Fall risk prevention has been discussed.  Pt declined my offer to send Community Resource Referral to Care Guide for a shower chair.  Depression Screen PHQ 2/9 Scores 12/19/2017 11/06/2017 08/07/2017 05/06/2017  PHQ - 2 Score 0 0 0 0     Cognitive Function     6CIT Screen 12/19/2017 12/16/2016  What Year? 0 points 0 points  What month? 0 points 0 points  What time? 0 points 0 points  Count back from 20 0 points 0 points  Months in reverse 0 points 0 points  Repeat phrase 2 points 0 points  Total Score 2 0    Immunization History  Administered Date(s) Administered  . Influenza, High Dose Seasonal PF 08/07/2017  . Influenza-Unspecified 07/12/2014, 08/02/2015, 08/12/2016  . Pneumococcal Conjugate-13 07/26/2014  . Pneumococcal Polysaccharide-23  12/16/2016  . Td 11/12/2003  . Tdap 08/12/2016    Qualifies for Shingles Vaccine? Yes.vDue for Zostavax or Shingrix vaccine. Education has been provided regarding the importance of this vaccine. Pt has been advised to call her insurance company to determine her out of pocket expense. Advised she may also receive this vaccine at her local pharmacy or Health Dept. Verbalized acceptance and understanding.  Screening Tests Health Maintenance  Topic Date Due  . MAMMOGRAM  07/17/2018  . Fecal DNA (Cologuard)  05/20/2020  . TETANUS/TDAP  08/12/2026  . INFLUENZA VACCINE  Completed  . DEXA SCAN  Completed  . PNA vac Low Risk Adult  Completed    Cancer Screenings: Lung: Low Dose CT Chest recommended if Age 67-80 years, 30 pack-year currently smoking OR have quit w/in 15years. Patient does not qualify. NON-SMOKER Breast:  Up to date on Mammogram? Yes. Completed 07/17/17. Repeat every year.   Up to date of Bone Density/Dexa? Yes. Completed 07/17/17. Osteoporotic screenings no longer required. Colorectal: Completed Cologuard 05/19/17. Repeat every 3 years.  Additional Screenings: Hepatitis B/HIV/Syphillis: Does not qualify Hepatitis C Screening: Does not qualify     Plan:  I have personally reviewed and addressed the Medicare Annual Wellness questionnaire and have noted the following in the patient's chart:  A. Medical and social history B. Use of alcohol, tobacco or illicit drugs  C. Current medications and supplements D. Functional ability and status E.  Nutritional status F.  Physical activity G. Advance directives and Code Status: H. List of other physicians I.  Hospitalizations, surgeries, and ER visits in previous 12 months J.  [place] such as hearing and vision if needed, cognitive and depression L. Referrals and appointments - none  In addition, I have reviewed and discussed with patient certain preventive protocols, quality metrics, and best practice recommendations. A  written personalized care plan for preventive services  as well as general preventive health recommendations were provided to patient.  See attached scanned questionnaire for additional information.   Signed,  Aleatha Borer, LPN Nurse Health Advisor\

## 2018-02-04 ENCOUNTER — Encounter: Payer: Self-pay | Admitting: Family Medicine

## 2018-02-04 ENCOUNTER — Ambulatory Visit (INDEPENDENT_AMBULATORY_CARE_PROVIDER_SITE_OTHER): Payer: Medicare Other | Admitting: Family Medicine

## 2018-02-04 VITALS — BP 140/80 | HR 67 | Temp 97.8°F | Ht 62.0 in | Wt 192.3 lb

## 2018-02-04 DIAGNOSIS — G8929 Other chronic pain: Secondary | ICD-10-CM

## 2018-02-04 DIAGNOSIS — G5601 Carpal tunnel syndrome, right upper limb: Secondary | ICD-10-CM

## 2018-02-04 DIAGNOSIS — M545 Low back pain, unspecified: Secondary | ICD-10-CM | POA: Insufficient documentation

## 2018-02-04 DIAGNOSIS — E559 Vitamin D deficiency, unspecified: Secondary | ICD-10-CM | POA: Insufficient documentation

## 2018-02-04 DIAGNOSIS — M8580 Other specified disorders of bone density and structure, unspecified site: Secondary | ICD-10-CM | POA: Insufficient documentation

## 2018-02-04 DIAGNOSIS — R739 Hyperglycemia, unspecified: Secondary | ICD-10-CM | POA: Diagnosis not present

## 2018-02-04 DIAGNOSIS — L409 Psoriasis, unspecified: Secondary | ICD-10-CM | POA: Insufficient documentation

## 2018-02-04 DIAGNOSIS — B029 Zoster without complications: Secondary | ICD-10-CM | POA: Insufficient documentation

## 2018-02-04 DIAGNOSIS — I10 Essential (primary) hypertension: Secondary | ICD-10-CM | POA: Diagnosis not present

## 2018-02-04 DIAGNOSIS — E785 Hyperlipidemia, unspecified: Secondary | ICD-10-CM | POA: Diagnosis not present

## 2018-02-04 DIAGNOSIS — Z85828 Personal history of other malignant neoplasm of skin: Secondary | ICD-10-CM | POA: Insufficient documentation

## 2018-02-04 DIAGNOSIS — Z1331 Encounter for screening for depression: Secondary | ICD-10-CM | POA: Insufficient documentation

## 2018-02-04 DIAGNOSIS — Z23 Encounter for immunization: Secondary | ICD-10-CM | POA: Insufficient documentation

## 2018-02-04 DIAGNOSIS — R7303 Prediabetes: Secondary | ICD-10-CM

## 2018-02-04 DIAGNOSIS — E7800 Pure hypercholesterolemia, unspecified: Secondary | ICD-10-CM | POA: Diagnosis not present

## 2018-02-04 DIAGNOSIS — Z0000 Encounter for general adult medical examination without abnormal findings: Secondary | ICD-10-CM | POA: Insufficient documentation

## 2018-02-04 DIAGNOSIS — Z5181 Encounter for therapeutic drug level monitoring: Secondary | ICD-10-CM

## 2018-02-04 DIAGNOSIS — N951 Menopausal and female climacteric states: Secondary | ICD-10-CM | POA: Insufficient documentation

## 2018-02-04 DIAGNOSIS — Z9181 History of falling: Secondary | ICD-10-CM | POA: Insufficient documentation

## 2018-02-04 DIAGNOSIS — E669 Obesity, unspecified: Secondary | ICD-10-CM

## 2018-02-04 DIAGNOSIS — R252 Cramp and spasm: Secondary | ICD-10-CM | POA: Insufficient documentation

## 2018-02-04 HISTORY — DX: Zoster without complications: B02.9

## 2018-02-04 HISTORY — DX: Psoriasis, unspecified: L40.9

## 2018-02-04 HISTORY — DX: History of falling: Z91.81

## 2018-02-04 MED ORDER — HYDROCHLOROTHIAZIDE 12.5 MG PO TABS: 12.5000 mg | ORAL_TABLET | Freq: Every day | ORAL | 1 refills | Status: DC

## 2018-02-04 MED ORDER — ROSUVASTATIN CALCIUM 20 MG PO TABS: 20.0000 mg | ORAL_TABLET | Freq: Every day | ORAL | 1 refills | Status: DC

## 2018-02-04 MED ORDER — TELMISARTAN 80 MG PO TABS: 80.0000 mg | ORAL_TABLET | Freq: Every day | ORAL | 1 refills | Status: DC

## 2018-02-04 NOTE — Patient Instructions (Addendum)
If you have not heard anything from my staff in a week about any orders/referrals/studies from today, please contact us here to follow-up ([phone]) [phone] If you need something for aches or pains, try to use Tylenol (acetaminophen) instead of non-steroidals (which include Aleve, ibuprofen, Advil, Motrin, and naproxen); non-steroidals can cause long-term kidney damage Try turmeric as a natural anti-inflammatory (for pain and arthritis). It comes in capsules where you buy aspirin and fish oil, but also as a spice where you buy pepper and garlic powder. Try almond milk, unsweetened, 45% of daily calcium with one glass for only 30 calories Try to follow the DASH guidelines (DASH stands for Dietary Approaches to Stop Hypertension). Try to limit the sodium in your diet to no more than 1,500mg  of sodium per day. Certainly try to not exceed 2,000 mg per day at the very most. Do not add salt when cooking or at the table.  Check the sodium amount on labels when shopping, and choose items lower in sodium when given a choice. Avoid or limit foods that already contain a lot of sodium. Eat a diet rich in fruits and vegetables and whole grains, and try to lose weight if overweight or obese Check out the information at familydoctor.org entitled "Nutrition for Weight Loss: What You Need to Know about Fad Diets" Try to lose between 1-2 pounds per week by taking in fewer calories and burning off more calories You can succeed by limiting portions, limiting foods dense in calories and fat, becoming more active, and drinking 8 glasses of water a day (64 ounces) Don't skip meals, especially breakfast, as skipping meals may alter your metabolism Do not use over-the-counter weight loss pills or gimmicks that claim rapid weight loss A healthy BMI (or body mass index) is between 18.5 and 24.9 You can calculate your ideal BMI at the [place] website [employer]fr

## 2018-02-04 NOTE — Assessment & Plan Note (Signed)
Continue to exercise, limit portions; drink water, see AVS; most important thing she can do to slow progression to diabetes is weight loss

## 2018-02-04 NOTE — Assessment & Plan Note (Signed)
Check fasting glucose and A1c 

## 2018-02-04 NOTE — Assessment & Plan Note (Signed)
Suspect facet arthropathy, degenerative changes like arthrtis; topicals, tylenol, turmeric, ice or heat; no red flags

## 2018-02-04 NOTE — Progress Notes (Signed)
BP 140/80 (BP Location: Right Arm, Patient Position: Sitting, Cuff Size: Large)   Pulse 67   Temp 97.8 F (36.6 C) (Oral)   Ht 5\' 2"  (1.575 m)   Wt 192 lb 4.8 oz (87.2 kg)   SpO2 98%   BMI 35.17 kg/m    Subjective:    Patient ID: Susan Summers, female    DOB: Jul 21, 1941, 77 y.o.   MRN: [id number]  HPI: Susan Summers is a 76 y.o. female  Chief Complaint  Patient presents with  . Knee Pain    arthtitis in LT knee  . Back Pain    Pain is intermittent, driving makes it worse as well as lifting  . Hypertension    BP elevated today, has been on vacation not eating well  . Hand Problem    Numbness at times, believes it is carpal tunnel, sleeps in brace    HPI Patient is new to me; previous provider left our practice  Back pain "My back hurts" she says; they did a bone density test, but he told her not for that Hurts all the way across the lower back; less than a year; really active; no weakness or shooting pains or numbness into the legs; she pulled a hamstring and they did xray and she has arthritis in her knee  She also may have carpal tunnel in her right side; she wears a brace on that; she got bit on the wrist in [place], something was in the brace and got her; getting better  HTN On telmisartan; has been vacation, and had a lot of salt this past week BP 355 systolic usually; checks at home; no decongestants; I asked about snoring and sleep apnea; she got an elevated bed and does fine; some white coat syndrome too; mother had HTN  High cholesterol On rosuvastatin 20 mg daily; no myalgias; inherited; not much fatty meats now  Prediabetes No blurred vision; not usually having dry mouth; father had oral agent diabetes, sister too  Obesity Avoiding soft drinks, exercise, drinking more water; pretty stable weight; thyroid has been checked she says  Osteopenia Last DEXA; not many greens, not much dairy  Depression screen [facility] 2/9 02/04/2018 12/19/2017 11/06/2017 08/07/2017  05/06/2017  Decreased Interest 0 0 0 0 0  Down, Depressed, Hopeless 0 0 0 0 0  PHQ - 2 Score 0 0 0 0 0    Relevant past medical, surgical, family and social history reviewed Past Medical History:  Diagnosis Date  . Hyperlipidemia   . Hypertension    Past Surgical History:  Procedure Laterality Date  . ABDOMINAL HYSTERECTOMY    . HERNIA REPAIR     Family History  Problem Relation Age of Onset  . Hypertension Mother   . Stroke Mother   . Rheum arthritis Mother   . Diabetes Father   . Cancer Sister        Ovarian  . Diabetes Sister   . Hypertension Brother   . Melanoma Daughter   . Hypertension Son   . Hyperlipidemia Son   . Stroke Brother   . Cancer Brother        Prostate  . Other Brother        bowel obstruction  . Hyperlipidemia Sister   . Cancer Sister        skin- SCC removed   Social History   Tobacco Use  . Smoking status: Never Smoker  . Smokeless tobacco: Never Used  . Tobacco comment: smoking  cessation materials not required  Substance Use Topics  . Alcohol use: No    Alcohol/week: 0.0 oz  . Drug use: No    Interim medical history since last visit reviewed. Allergies and medications reviewed  Review of Systems Per HPI unless specifically indicated above     Objective:    BP 140/80 (BP Location: Right Arm, Patient Position: Sitting, Cuff Size: Large)   Pulse 67   Temp 97.8 F (36.6 C) (Oral)   Ht 5\' 2"  (1.575 m)   Wt 192 lb 4.8 oz (87.2 kg)   SpO2 98%   BMI 35.17 kg/m   Wt Readings from Last 3 Encounters:  02/04/18 192 lb 4.8 oz (87.2 kg)  12/19/17 190 lb 14.4 oz (86.6 kg)  11/06/17 191 lb (86.6 kg)    Physical Exam  Constitutional: She appears well-developed and well-nourished. No distress.  obese  HENT:  Head: Normocephalic and atraumatic.  Eyes: EOM are normal. No scleral icterus.  Neck: No thyromegaly present.  Cardiovascular: Normal rate, regular rhythm and normal heart sounds.  No murmur heard. Pulmonary/Chest: Effort  normal and breath sounds normal. No respiratory distress. She has no wheezes.  Abdominal: Soft. Bowel sounds are normal. She exhibits no distension.  Musculoskeletal: She exhibits no edema.       Lumbar back: She exhibits decreased range of motion. She exhibits no tenderness, no bony tenderness and no deformity.  Discomfort with rotation to the right and left with extension  Neurological: She is alert. She displays no tremor. She exhibits normal muscle tone.  Negative tinel's and phalen's; no loss of muscle mass in the right thenar region  Skin: Skin is warm and dry. She is not diaphoretic. No pallor.     Excoriation on the volar surface RIGHT distal forearm; no proximal erythema; no fluctuance  Psychiatric: She has a normal mood and affect. Her behavior is normal. Judgment and thought content normal.    Results for orders placed or performed in visit on 08/07/17  Lipid panel  Result Value Ref Range   Cholesterol, Total 126 100 - 199 mg/dL   Triglycerides 198 (H) 0 - 149 mg/dL   HDL 39 (L) >39 mg/dL   VLDL Cholesterol Cal 40 5 - 40 mg/dL   LDL Calculated 47 0 - 99 mg/dL   Chol/HDL Ratio 3.2 0.0 - 4.4 ratio      Assessment & Plan:   Problem List Items Addressed This Visit      Cardiovascular and Mediastinum   Benign hypertension    Controlled at home; try DASH guidelines; continue medicine      Relevant Medications   hydrochlorothiazide (HYDRODIURIL) 12.5 MG tablet   rosuvastatin (CRESTOR) 20 MG tablet   telmisartan (MICARDIS) 80 MG tablet     Other   Prediabetes    Check fasting glucose, A1c; encouraged her to lose weight, most important thing to prevent diabetes      Relevant Orders   Hemoglobin A1c   Obesity (BMI 35.0-39.9 without comorbidity)    Continue to exercise, limit portions; drink water, see AVS; most important thing she can do to slow progression to diabetes is weight loss      Hyperlipidemia    Check fasting lipids today; avoid saturated fats; continue  statin      Relevant Medications   hydrochlorothiazide (HYDRODIURIL) 12.5 MG tablet   rosuvastatin (CRESTOR) 20 MG tablet   telmisartan (MICARDIS) 80 MG tablet   Other Relevant Orders   Lipid panel   Hyperglycemia  Check fasting glucose and A1c      Chronic lower back pain    Suspect facet arthropathy, degenerative changes like arthrtis; topicals, tylenol, turmeric, ice or heat; no red flags       Other Visit Diagnoses    Carpal tunnel syndrome of right wrist    -  Primary   continue brace; let me know if worsening; limit repetitive motions   Medication monitoring encounter       Relevant Orders   Comprehensive metabolic panel       Follow up plan: Return in about 6 months (around 08/07/2018) for twenty minute follow-up with fasting labs.  An after-visit summary was printed and given to the patient at [place].  Please see the patient instructions which may contain other information and recommendations beyond what is mentioned above in the assessment and plan.  Meds ordered this encounter  Medications  . hydrochlorothiazide (HYDRODIURIL) 12.5 MG tablet    Sig: Take 1 tablet (12.5 mg total) by mouth daily.    Dispense:  90 tablet    Refill:  1  . rosuvastatin (CRESTOR) 20 MG tablet    Sig: Take 1 tablet (20 mg total) by mouth at bedtime.    Dispense:  90 tablet    Refill:  1  . telmisartan (MICARDIS) 80 MG tablet    Sig: Take 1 tablet (80 mg total) by mouth daily.    Dispense:  90 tablet    Refill:  1    Replace losartan... Pt notified of the change    Orders Placed This Encounter  Procedures  . Comprehensive metabolic panel  . Hemoglobin A1c  . Lipid panel   Patient was advised: If you have not heard anything from my staff in a week about any orders/referrals/studies from today, please contact us here to follow-up [phone]

## 2018-02-04 NOTE — Assessment & Plan Note (Signed)
Controlled at home; try DASH guidelines; continue medicine

## 2018-02-04 NOTE — Assessment & Plan Note (Signed)
Check fasting glucose, A1c; encouraged her to lose weight, most important thing to prevent diabetes

## 2018-02-04 NOTE — Assessment & Plan Note (Signed)
Check fasting lipids today; avoid saturated fats; continue statin

## 2018-02-05 LAB — COMPLETE METABOLIC PANEL WITH GFR
AG Ratio: 1.8 (calc) (ref 1.0–2.5)
ALT: 21 U/L (ref 6–29)
AST: 19 U/L (ref 10–35)
Albumin: 4.2 g/dL (ref 3.6–5.1)
Alkaline phosphatase (APISO): 71 U/L (ref 33–130)
BUN: 13 mg/dL (ref 7–25)
CALCIUM: 9.1 mg/dL (ref 8.6–10.4)
CHLORIDE: 106 mmol/L (ref 98–110)
CO2: 28 mmol/L (ref 20–32)
Creat: 0.76 mg/dL (ref 0.60–0.93)
GFR, EST NON AFRICAN AMERICAN: 77 mL/min/{1.73_m2} (ref >=60)
GFR, Est African American: 90 mL/min/{1.73_m2} (ref >=60)
GLOBULIN: 2.3 g/dL (ref 1.9–3.7)
Glucose, Bld: 105 mg/dL — ABNORMAL HIGH (ref 65–99)
POTASSIUM: 3.9 mmol/L (ref 3.5–5.3)
SODIUM: 141 mmol/L (ref 135–146)
Total Bilirubin: 0.8 mg/dL (ref 0.2–1.2)
Total Protein: 6.5 g/dL (ref 6.1–8.1)

## 2018-02-05 LAB — HEMOGLOBIN A1C
EAG (MMOL/L): 6.5 (calc)
Hgb A1c MFr Bld: 5.7 % of total Hgb — ABNORMAL HIGH (ref <5.7)
MEAN PLASMA GLUCOSE: 117 (calc)

## 2018-02-05 LAB — LIPID PANEL
CHOL/HDL RATIO: 3.5 (calc) (ref <5.0)
CHOLESTEROL: 117 mg/dL (ref <200)
HDL: 33 mg/dL — AB (ref >50)
LDL Cholesterol (Calc): 63 mg/dL (calc)
NON-HDL CHOLESTEROL (CALC): 84 mg/dL (ref <130)
TRIGLYCERIDES: 120 mg/dL (ref <150)

## 2018-07-30 ENCOUNTER — Other Ambulatory Visit: Payer: Self-pay | Admitting: Family Medicine

## 2018-07-30 DIAGNOSIS — E785 Hyperlipidemia, unspecified: Secondary | ICD-10-CM

## 2018-07-30 DIAGNOSIS — I10 Essential (primary) hypertension: Secondary | ICD-10-CM

## 2018-07-31 NOTE — Telephone Encounter (Signed)
Last appt March; appt scheduled for later in Sept Last labs reviewed Rxs approved

## 2018-08-07 ENCOUNTER — Ambulatory Visit
Admission: RE | Admit: 2018-08-07 | Discharge: 2018-08-07 | Disposition: A | Discharge status: Home / Self Care | Payer: Medicare Other | Source: Ambulatory Visit | Attending: Family Medicine | Admitting: Family Medicine

## 2018-08-07 ENCOUNTER — Ambulatory Visit (INDEPENDENT_AMBULATORY_CARE_PROVIDER_SITE_OTHER): Payer: Medicare Other | Admitting: Family Medicine

## 2018-08-07 ENCOUNTER — Encounter: Payer: Self-pay | Admitting: Family Medicine

## 2018-08-07 VITALS — BP 130/72 | HR 75 | Temp 98.1°F | Ht 62.0 in | Wt 188.0 lb

## 2018-08-07 DIAGNOSIS — M79602 Pain in left arm: Secondary | ICD-10-CM | POA: Insufficient documentation

## 2018-08-07 DIAGNOSIS — M7989 Other specified soft tissue disorders: Secondary | ICD-10-CM

## 2018-08-07 DIAGNOSIS — R7303 Prediabetes: Secondary | ICD-10-CM

## 2018-08-07 DIAGNOSIS — Z23 Encounter for immunization: Secondary | ICD-10-CM | POA: Diagnosis not present

## 2018-08-07 DIAGNOSIS — E669 Obesity, unspecified: Secondary | ICD-10-CM | POA: Insufficient documentation

## 2018-08-07 DIAGNOSIS — M799 Soft tissue disorder, unspecified: Secondary | ICD-10-CM | POA: Diagnosis present

## 2018-08-07 DIAGNOSIS — M8580 Other specified disorders of bone density and structure, unspecified site: Secondary | ICD-10-CM

## 2018-08-07 DIAGNOSIS — Z6834 Body mass index (BMI) 34.0-34.9, adult: Secondary | ICD-10-CM | POA: Insufficient documentation

## 2018-08-07 DIAGNOSIS — E7800 Pure hypercholesterolemia, unspecified: Secondary | ICD-10-CM

## 2018-08-07 DIAGNOSIS — Z5181 Encounter for therapeutic drug level monitoring: Secondary | ICD-10-CM

## 2018-08-07 DIAGNOSIS — I10 Essential (primary) hypertension: Secondary | ICD-10-CM | POA: Diagnosis not present

## 2018-08-07 MED ORDER — TELMISARTAN 80 MG PO TABS: 80.0000 mg | ORAL_TABLET | Freq: Every day | ORAL | 1 refills | Status: DC

## 2018-08-07 NOTE — Assessment & Plan Note (Signed)
Check glucose (fasting) and A1c; she is working on weight loss and better eating

## 2018-08-07 NOTE — Assessment & Plan Note (Signed)
Proud of her efforts; she was not able to pin down a short-term or long-term goal; she declined referral to nutritionist; encouragement given; see AVS

## 2018-08-07 NOTE — Patient Instructions (Addendum)
Check out the information at familydoctor.org entitled "Nutrition for Weight Loss: What You Need to Know about Fad Diets" Try to lose between 1-2 pounds per week by taking in fewer calories and burning off more calories You can succeed by limiting portions, limiting foods dense in calories and fat, becoming more active, and drinking 8 glasses of water a day (64 ounces) Don't skip meals, especially breakfast, as skipping meals may alter your metabolism Do not use over-the-counter weight loss pills or gimmicks that claim rapid weight loss A healthy BMI (or body mass index) is between 18.5 and 24.9 You can calculate your ideal BMI at the NIH website ClubMonetize[employer]fr Try to limit saturated fats in your diet (bologna, hot dogs, barbeque, cheeseburgers, hamburgers, steak, bacon, sausage, cheese, etc.) and get more fresh fruits, vegetables, and whole grains Try to follow the DASH guidelines (DASH stands for Dietary Approaches to Stop Hypertension). Try to limit the sodium in your diet to no more than 1,500mg  of sodium per day. Certainly try to not exceed 2,000 mg per day at the very most. Do not add salt when cooking or at the table.  Check the sodium amount on labels when shopping, and choose items lower in sodium when given a choice. Avoid or limit foods that already contain a lot of sodium. Eat a diet rich in fruits and vegetables and whole grains, and try to lose weight if overweight or obese   Obesity, Adult Obesity is the condition of having too much total body fat. Being overweight or obese means that your weight is greater than what is considered healthy for your body size. Obesity is determined by a measurement called BMI. BMI is an estimate of body fat and is calculated from height and weight. For adults, a BMI of 30 or higher is considered obese. Obesity can eventually lead to other health concerns and major illnesses,  including:  Stroke.  Coronary artery disease (CAD).  Type 2 diabetes.  Some types of cancer, including cancers of the colon, breast, uterus, and gallbladder.  Osteoarthritis.  High blood pressure (hypertension).  High cholesterol.  Sleep apnea.  Gallbladder stones.  Infertility problems.  What are the causes? The main cause of obesity is taking in (consuming) more calories than your body uses for energy. Other factors that contribute to this condition may include:  Being born with genes that make you more likely to become obese.  Having a medical condition that causes obesity. These conditions include: ? Hypothyroidism. ? Polycystic ovarian syndrome (PCOS). ? Binge-eating disorder. ? Cushing syndrome.  Taking certain medicines, such as steroids, antidepressants, and seizure medicines.  Not being physically active (sedentary lifestyle).  Living where there are limited places to exercise safely or buy healthy foods.  Not getting enough sleep.  What increases the risk? The following factors may increase your risk of this condition:  Having a family history of obesity.  Being a woman of African-American descent.  Being a man of Hispanic descent.  What are the signs or symptoms? Having excessive body fat is the main symptom of this condition. How is this diagnosed? This condition may be diagnosed based on:  Your symptoms.  Your medical history.  A physical exam. Your health care provider may measure: ? Your BMI. If you are an adult with a BMI between 25 and less than 30, you are considered overweight. If you are an adult with a BMI of 30 or higher, you are considered obese. ? The distances around your hips and your  waist (circumferences). These may be compared to each other to help diagnose your condition. ? Your skinfold thickness. Your health care provider may gently pinch a fold of your skin and measure it.  How is this treated? Treatment for this  condition often includes changing your lifestyle. Treatment may include some or all of the following:  Dietary changes. Work with your health care provider and a dietitian to set a weight-loss goal that is healthy and reasonable for you. Dietary changes may include eating: ? Smaller portions. A portion size is the amount of a particular food that is healthy for you to eat at one time. This varies from person to person. ? Low-calorie or low-fat options. ? More whole grains, fruits, and vegetables.  Regular physical activity. This may include aerobic activity (cardio) and strength training.  Medicine to help you lose weight. Your health care provider may prescribe medicine if you are unable to lose 1 pound a week after 6 weeks of eating more healthily and doing more physical activity.  Surgery. Surgical options may include gastric banding and gastric bypass. Surgery may be done if: ? Other treatments have not helped to improve your condition. ? You have a BMI of 40 or higher. ? You have life-threatening health problems related to obesity.  Follow these instructions at home:  Eating and drinking   Follow recommendations from your health care provider about what you eat and drink. Your health care provider may advise you to: ? Limit fast foods, sweets, and processed snack foods. ? Choose low-fat options, such as low-fat milk instead of whole milk. ? Eat 5 or more servings of fruits or vegetables every day. ? Eat at home more often. This gives you more control over what you eat. ? Choose healthy foods when you eat out. ? Learn what a healthy portion size is. ? Keep low-fat snacks on hand. ? Avoid sugary drinks, such as soda, fruit juice, iced tea sweetened with sugar, and flavored milk. ? Eat a healthy breakfast.  Drink enough water to keep your urine clear or pale yellow.  Do not go without eating for long periods of time (do not fast) or follow a fad diet. Fasting and fad diets can be  unhealthy and even dangerous. Physical Activity  Exercise regularly, as told by your health care provider. Ask your health care provider what types of exercise are safe for you and how often you should exercise.  Warm up and stretch before being active.  Cool down and stretch after being active.  Rest between periods of activity. Lifestyle  Limit the time that you spend in front of your TV, computer, or video game system.  Find ways to reward yourself that do not involve food.  Limit alcohol intake to no more than 1 drink a day for nonpregnant women and 2 drinks a day for men. One drink equals 12 oz of beer, 5 oz of wine, or 1 oz of hard liquor. General instructions  Keep a weight loss journal to keep track of the food you eat and how much you exercise you get.  Take over-the-counter and prescription medicines only as told by your health care provider.  Take vitamins and supplements only as told by your health care provider.  Consider joining a support group. Your health care provider may be able to recommend a support group.  Keep all follow-up visits as told by your health care provider. This is important. Contact a health care provider if:  You are  unable to meet your weight loss goal after 6 weeks of dietary and lifestyle changes. This information is not intended to replace advice given to you by your health care provider. Make sure you discuss any questions you have with your health care provider. Document Released: 12/05/2004 Document Revised: 04/01/2016 Document Reviewed: 08/16/2015 Elsevier Interactive Patient Education  2018 [employer].

## 2018-08-07 NOTE — Assessment & Plan Note (Signed)
Check lipids today; continue statin; she is working on healthier eating and weight loss

## 2018-08-07 NOTE — Assessment & Plan Note (Signed)
Proud of her walking and reviewed last two DEXA showing improvement; next dexa in Sept 2020

## 2018-08-07 NOTE — Progress Notes (Signed)
BP 130/72   Pulse 75   Temp 98.1 F (36.7 C)   Ht 5\' 2"  (1.575 m)   Wt 188 lb (85.3 kg)   SpO2 97%   BMI 34.39 kg/m    Subjective:    Patient ID: Susan Summers, female    DOB: 1941-10-19, 77 y.o.   MRN: [id number]  HPI: Susan Summers is a 77 y.o. female  Chief Complaint  Patient presents with  . Follow-up    HPI Patient is here for f/u  Hypertension On ARB, HCTZ Checks her BP every night; 127/69 and similar numbers; last night 153 but she ate salty stuff  Arthritis Taking meloxicam previously, but not having to take now because taking turmeric and tart cherry; back arthritis  They did bone density and said she had DEXA in Sept 2018; back improved a full half point Osteopenia; she bowls, does yard work, and walks; T-score improved in her hip from -1.1 to -.0.6; uses step stools and safe ladders; not taking risk; puts almond milk on cereal  She has this fatty tumor on her left arm for years and years; one doctor told her to not worry about it; not sure if primary or dermatologist; has not grown; she hits her arm on the doorknob and now it hurts; soft mass near [facility] fossa; pain over the extensor surface of the left distal arm; no bruising; just hits it in the laundry room; grip is good; no numbness of tingling; about 40 years ago, fractured the left wrist; she is right-handed  High cholesterol On statin Trying to eat a good diet,  Lab Results  Component Value Date   CHOL 117 02/04/2018   HDL 33 (L) 02/04/2018   LDLCALC 63 02/04/2018   TRIG 120 02/04/2018   CHOLHDL 3.5 02/04/2018   Prediabetes working on weight loss and better eating; some dry mouth right now because she is fasting; father had diabetes, old age diabetes  Obesity; BMI down to 35; she wants to keep losing; she is walking and mowing grass, washes the car, does lots of chores; drinks lots of water; some empty calories; tries to cut off eating after 7 pm  HM list Flu shot; done Mammogram; she will  schedule  Depression screen [facility] 2/9 08/07/2018 02/04/2018 12/19/2017 11/06/2017 08/07/2017  Decreased Interest 0 0 0 0 0  Down, Depressed, Hopeless 0 0 0 0 0  PHQ - 2 Score 0 0 0 0 0   Fall Risk  08/07/2018 02/04/2018 12/19/2017 11/06/2017 08/07/2017  Falls in the past year? No No No No No  Comment - - - - -  Number falls in past yr: - - - - -  Injury with Fall? - - - - -  Risk for fall due to : - - History of fall(s);Impaired vision - -  Risk for fall due to: Comment - - fallen in the yard; wears eyeglasses - -    Relevant past medical, surgical, family and social history reviewed Past Medical History:  Diagnosis Date  . Hyperlipidemia   . Hypertension    Past Surgical History:  Procedure Laterality Date  . ABDOMINAL HYSTERECTOMY    . HERNIA REPAIR     Family History  Problem Relation Age of Onset  . Hypertension Mother   . Stroke Mother   . Rheum arthritis Mother   . Diabetes Father   . Cancer Sister        Ovarian  . Diabetes Sister   . Hypertension  Brother   . Melanoma Daughter   . Hypertension Son   . Hyperlipidemia Son   . Stroke Brother   . Cancer Brother        Prostate  . Other Brother        bowel obstruction  . Hyperlipidemia Sister   . Cancer Sister        skin- SCC removed   Social History   Tobacco Use  . Smoking status: Never Smoker  . Smokeless tobacco: Never Used  . Tobacco comment: smoking cessation materials not required  Substance Use Topics  . Alcohol use: No    Alcohol/week: 0.0 standard drinks  . Drug use: No     Office Visit from 08/07/2018 in [facility]  AUDIT-C Score  0      Interim medical history since last visit reviewed. Allergies and medications reviewed  Review of Systems Per HPI unless specifically indicated above     Objective:    BP 130/72   Pulse 75   Temp 98.1 F (36.7 C)   Ht 5\' 2"  (1.575 m)   Wt 188 lb (85.3 kg)   SpO2 97%   BMI 34.39 kg/m   Wt Readings from Last 3 Encounters:    08/07/18 188 lb (85.3 kg)  02/04/18 192 lb 4.8 oz (87.2 kg)  12/19/17 190 lb 14.4 oz (86.6 kg)    Physical Exam  Constitutional: She appears well-developed and well-nourished. No distress.  HENT:  Head: Normocephalic and atraumatic.  Eyes: EOM are normal. No scleral icterus.  Neck: No thyromegaly present.  Cardiovascular: Normal rate, regular rhythm and normal heart sounds.  No murmur heard. Pulmonary/Chest: Effort normal and breath sounds normal. No respiratory distress. She has no wheezes.  Abdominal: Soft. Bowel sounds are normal. She exhibits no distension.  Musculoskeletal: She exhibits no edema.       Arms: Soft tissue mass LEFT AC fossa, proximal LEFT forearm; tenderness over the extensor surface LEFT distal forearm between radius and ulna; no bruising  Neurological: She is alert.  Skin: Skin is warm and dry. She is not diaphoretic. No pallor.  Psychiatric: She has a normal mood and affect. Her behavior is normal. Judgment and thought content normal. Her mood appears not anxious. She does not exhibit a depressed mood.    Results for orders placed or performed in visit on 08/07/17  Lipid panel  Result Value Ref Range   Cholesterol, Total 126 100 - 199 mg/dL   Triglycerides 198 (H) 0 - 149 mg/dL   HDL 39 (L) >39 mg/dL   VLDL Cholesterol Cal 40 5 - 40 mg/dL   LDL Calculated 47 0 - 99 mg/dL   Chol/HDL Ratio 3.2 0.0 - 4.4 ratio  Lipid panel  Result Value Ref Range   Cholesterol 117 <200 mg/dL   HDL 33 (L) >50 mg/dL   Triglycerides 120 <150 mg/dL   LDL Cholesterol (Calc) 63 mg/dL (calc)   Total CHOL/HDL Ratio 3.5 <5.0 (calc)   Non-HDL Cholesterol (Calc) 84 <130 mg/dL (calc)  COMPLETE METABOLIC PANEL WITH GFR  Result Value Ref Range   Glucose, Bld 105 (H) 65 - 99 mg/dL   BUN 13 7 - 25 mg/dL   Creat 0.76 0.60 - 0.93 mg/dL   GFR, Est Non African American 77 > OR = 60 mL/min/1.52m2   GFR, Est African American 90 > OR = 60 mL/min/1.42m2   BUN/Creatinine Ratio NOT  APPLICABLE 6 - 22 (calc)   Sodium 141 135 - 146 mmol/L  Potassium 3.9 3.5 - 5.3 mmol/L   Chloride 106 98 - 110 mmol/L   CO2 28 20 - 32 mmol/L   Calcium 9.1 8.6 - 10.4 mg/dL   Total Protein 6.5 6.1 - 8.1 g/dL   Albumin 4.2 3.6 - 5.1 g/dL   Globulin 2.3 1.9 - 3.7 g/dL (calc)   AG Ratio 1.8 1.0 - 2.5 (calc)   Total Bilirubin 0.8 0.2 - 1.2 mg/dL   Alkaline phosphatase (APISO) 71 33 - 130 U/L   AST 19 10 - 35 U/L   ALT 21 6 - 29 U/L  Hemoglobin A1c  Result Value Ref Range   Hgb A1c MFr Bld 5.7 (H) <5.7 % of total Hgb   Mean Plasma Glucose 117 (calc)   eAG (mmol/L) 6.5 (calc)      Assessment & Plan:   Problem List Items Addressed This Visit      Cardiovascular and Mediastinum   Benign hypertension    Controlled today; continue medicine; proud of her efforts      Relevant Medications   telmisartan (MICARDIS) 80 MG tablet     Musculoskeletal and Integument   Osteopenia of the elderly    Proud of her walking and reviewed last two DEXA showing improvement; next dexa in Sept 2020        Other   Prediabetes    Check glucose (fasting) and A1c; she is working on weight loss and better eating      Relevant Orders   Hemoglobin A1c   Obesity (BMI 30.0-34.9)    Proud of her efforts; she was not able to pin down a short-term or long-term goal; she declined referral to nutritionist; encouragement given; see AVS      Hyperlipidemia (Chronic)    Check lipids today; continue statin; she is working on healthier eating and weight loss      Relevant Medications   telmisartan (MICARDIS) 80 MG tablet   Other Relevant Orders   Lipid panel    Other Visit Diagnoses    Arm pain, left    -  Primary   Relevant Orders   Ambulatory referral to General Surgery   DG Forearm Left   DG Elbow Complete Left   Soft tissue mass       Relevant Orders   Ambulatory referral to General Surgery   DG Elbow Complete Left   Medication monitoring encounter       Relevant Orders   COMPLETE  METABOLIC PANEL WITH GFR   Need for influenza vaccination       Relevant Orders   Flu vaccine HIGH DOSE PF (Fluzone High dose) (Completed)       Follow up plan: Return in about 6 months (around 02/05/2019) for follow-up visit with Dr. Sanda Klein; Medicare Wellness yearly.  An after-visit summary was printed and given to the patient at [place].  Please see the patient instructions which may contain other information and recommendations beyond what is mentioned above in the assessment and plan.  Meds ordered this encounter  Medications  . telmisartan (MICARDIS) 80 MG tablet    Sig: Take 1 tablet (80 mg total) by mouth daily.    Dispense:  90 tablet    Refill:  1    Replace losartan... Pt notified of the change    Orders Placed This Encounter  Procedures  . DG Forearm Left  . DG Elbow Complete Left  . Flu vaccine HIGH DOSE PF (Fluzone High dose)  . COMPLETE METABOLIC PANEL WITH GFR  .  Lipid panel  . Hemoglobin A1c  . Ambulatory referral to General Surgery

## 2018-08-07 NOTE — Assessment & Plan Note (Signed)
Controlled today; continue medicine; proud of her efforts

## 2018-08-08 LAB — COMPLETE METABOLIC PANEL WITH GFR
AG Ratio: 1.8 (calc) (ref 1.0–2.5)
ALKALINE PHOSPHATASE (APISO): 82 U/L (ref 33–130)
ALT: 15 U/L (ref 6–29)
AST: 17 U/L (ref 10–35)
Albumin: 4.4 g/dL (ref 3.6–5.1)
BILIRUBIN TOTAL: 0.9 mg/dL (ref 0.2–1.2)
BUN: 15 mg/dL (ref 7–25)
CHLORIDE: 102 mmol/L (ref 98–110)
CO2: 31 mmol/L (ref 20–32)
Calcium: 9.3 mg/dL (ref 8.6–10.4)
Creat: 0.81 mg/dL (ref 0.60–0.93)
GFR, EST AFRICAN AMERICAN: 82 mL/min/{1.73_m2} (ref >=60)
GFR, Est Non African American: 71 mL/min/{1.73_m2} (ref >=60)
Globulin: 2.4 g/dL (calc) (ref 1.9–3.7)
Glucose, Bld: 107 mg/dL — ABNORMAL HIGH (ref 65–99)
Potassium: 3.6 mmol/L (ref 3.5–5.3)
Sodium: 140 mmol/L (ref 135–146)
Total Protein: 6.8 g/dL (ref 6.1–8.1)

## 2018-08-08 LAB — HEMOGLOBIN A1C
HEMOGLOBIN A1C: 5.8 %{Hb} — AB (ref <5.7)
Mean Plasma Glucose: 120 (calc)
eAG (mmol/L): 6.6 (calc)

## 2018-08-08 LAB — LIPID PANEL
CHOLESTEROL: 115 mg/dL (ref <200)
HDL: 29 mg/dL — AB (ref >50)
LDL Cholesterol (Calc): 60 mg/dL (calc)
Non-HDL Cholesterol (Calc): 86 mg/dL (calc) (ref <130)
TRIGLYCERIDES: 193 mg/dL — AB (ref <150)
Total CHOL/HDL Ratio: 4 (calc) (ref <5.0)

## 2018-08-13 ENCOUNTER — Encounter: Payer: Self-pay | Admitting: Surgery

## 2018-08-13 ENCOUNTER — Ambulatory Visit: Payer: Medicare Other | Admitting: Surgery

## 2018-08-13 VITALS — BP 155/75 | HR 76 | Temp 98.6°F | Ht 62.0 in | Wt 189.0 lb

## 2018-08-13 DIAGNOSIS — D1722 Benign lipomatous neoplasm of skin and subcutaneous tissue of left arm: Secondary | ICD-10-CM | POA: Diagnosis not present

## 2018-08-13 NOTE — Progress Notes (Signed)
Surgical Clinic History and Physical  Referring provider:  Arnetha Courser, MD [address] [place], [place] [address]  HISTORY OF PRESENT ILLNESS (HPI):  77 y.o. female presents for evaluation of possible Left proximal forearm lipoma. Patient reports she developed focal Left lateral mid-forearm extensor-surface pain after repeatedly hitting her arm at that location against a doorknob where two doors open together in the same direction at her home. During exam by patient's primary care physician, she was noted to have prominent and slightly firm subcutaneous fat of her proximal medial Left forearm and referred accordingly for evaluation and possible excision of possible Left proximal forearm lipoma. Patient states the area has remained unchanged for many years and does not hurt, though she says her mid-forearm pain described above radiates both proximally and distally. Patient otherwise denies any fever/chills, CP, or SOB.  PAST MEDICAL HISTORY (PMH):  Past Medical History:  Diagnosis Date  . Hyperlipidemia   . Hypertension      PAST SURGICAL HISTORY ([place]):  Past Surgical History:  Procedure Laterality Date  . ABDOMINAL HYSTERECTOMY    . APPENDECTOMY    . HERNIA REPAIR    . oopherrectomy  2012  . UMBILICAL HERNIA REPAIR  2012     MEDICATIONS:  Prior to Admission medications   Medication Sig Start Date End Date Taking? Authorizing Provider  Cholecalciferol (VITAMIN D PO) Take 4,000 Units by mouth daily.    Yes Keith Rake Asad A, MD  ELDERBERRY PO Take 1,250 mg by mouth daily.    Yes Roselee Nova, MD  Garlic 263 MG TABS Take by mouth daily.   Yes [provider]  hydrochlorothiazide (MICROZIDE) 12.5 MG capsule TAKE 1 CAPSULE BY MOUTH ONCE DAILY 07/31/18  Yes Lada, Satira Anis, MD  Misc Natural Products (TART CHERRY ADVANCED PO) Take 1 capsule by mouth daily.   Yes [provider]  Omega-3 Fatty Acids (FISH OIL) 1000 MG CAPS Take 2 capsules by mouth  daily.    Yes Roselee Nova, MD  Probiotic Product (PROBIOTIC DAILY PO) Take by mouth daily.    Yes Keith Rake Asad A, MD  rosuvastatin (CRESTOR) 20 MG tablet TAKE 1 TABLET BY MOUTH DAILY AT BEDTIME 07/31/18  Yes Lada, Satira Anis, MD  telmisartan (MICARDIS) 80 MG tablet Take 1 tablet (80 mg total) by mouth daily. 08/07/18  Yes Lada, Satira Anis, MD  Turmeric 400 MG CAPS Take 2 capsules by mouth daily.   Yes [provider]     ALLERGIES:  Allergies  Allergen Reactions  . Nickel   . Penicillins      SOCIAL HISTORY:  Social History   Socioeconomic History  . Marital status: Widowed    Spouse name: Zenia Resides  . Number of children: 2  . Years of education: Not on file  . Highest education level: 12th grade  Occupational History  . Occupation: Retired  Scientific laboratory technician  . Financial resource strain: Not hard at all  . Food insecurity:    Worry: Never true    Inability: Never true  . Transportation needs:    Medical: No    Non-medical: No  Tobacco Use  . Smoking status: Never Smoker  . Smokeless tobacco: Never Used  . Tobacco comment: smoking cessation materials not required  Substance and Sexual Activity  . Alcohol use: No    Alcohol/week: 0.0 standard drinks  . Drug use: No  . Sexual activity: Not Currently  Lifestyle  . Physical activity:  Days per week: 5 days    Minutes per session: 60 min  . Stress: Not at all  Relationships  . Social connections:    Talks on phone: Patient refused    Gets together: Patient refused    Attends religious service: Patient refused    Active member of club or organization: Patient refused    Attends meetings of clubs or organizations: Patient refused    Relationship status: Widowed  . Intimate partner violence:    Fear of current or ex partner: No    Emotionally abused: No    Physically abused: No    Forced sexual activity: No  Other Topics Concern  . Not on file  Social History Narrative  . Not on file    The patient  currently resides (home / rehab facility / nursing home): Home The patient normally is (ambulatory / bedbound): Ambulatory  FAMILY HISTORY:  Family History  Problem Relation Age of Onset  . Hypertension Mother   . Stroke Mother   . Rheum arthritis Mother   . Diabetes Father   . Cancer Sister        Ovarian  . Diabetes Sister   . Hypertension Brother   . Melanoma Daughter   . Hypertension Son   . Hyperlipidemia Son   . Stroke Brother   . Cancer Brother        Prostate  . Other Brother        bowel obstruction  . Hyperlipidemia Sister   . Cancer Sister        skin- SCC removed    Otherwise negative/non-contributory.  REVIEW OF SYSTEMS:  Constitutional: denies any other weight loss, fever, chills, or sweats  Eyes: denies any other vision changes, history of eye injury  ENT: denies sore throat, hearing problems  Respiratory: denies shortness of breath, wheezing  Cardiovascular: denies chest pain, palpitations  Gastrointestinal: denies abdominal pain, N/V, or diarrhea Musculoskeletal: denies any other joint pains or cramps  Skin: Denies any other rashes or skin discolorations except as per HPI Neurological: denies any other headache, dizziness, weakness  Psychiatric: Denies any other depression, anxiety   All other review of systems were otherwise negative   VITAL SIGNS:  BP (!) 155/75   Pulse 76   Temp 98.6 F (37 C) (Temporal)   Ht 5\' 2"  (1.575 m)   Wt 189 lb (85.7 kg)   BMI 34.57 kg/m    PHYSICAL EXAM:  Constitutional:  -- Obese body habitus  -- Awake, alert, and oriented x3  Eyes:  -- Pupils equally round and reactive to light  -- No scleral icterus  Ear, nose, throat:  -- No jugular venous distension -- No nasal drainage, bleeding Pulmonary:  -- No crackles  -- Equal breath sounds bilaterally -- Breathing non-labored at rest Cardiovascular:  -- S1, S2 present  -- No pericardial rubs  Gastrointestinal:  -- Abdomen soft, nontender, non-distended, no  guarding/rebound  -- No abdominal masses appreciated, pulsatile or otherwise  Musculoskeletal and Integumentary:  -- Wounds or skin discoloration: Left > Right prominent skin fold distal to B/L antecubital fossas, definitely more prominent on proximal Left medial forearm than lateral forearm, possibly consistent with a subtle subcutaneous lipoma, non-tender to palpation without surrounding erythema or drainage -- Extremities: B/L UE and LE FROM, hands and feet warm  Neurologic:  -- Motor function: Intact and symmetric -- Sensation: Intact and symmetric  Labs:  CBC Latest Ref Rng & Units 05/06/2017  WBC 3.4 - 10.8 x10E3/uL 7.0  Hemoglobin 11.1 - 15.9 g/dL 13.0  Hematocrit 34.0 - 46.6 % 38.3  Platelets 150 - 379 x10E3/uL 196   CMP Latest Ref Rng & Units 08/07/2018 02/04/2018 11/13/2016  Glucose 65 - 99 mg/dL 107(H) 105(H) 106(H)  BUN 7 - 25 mg/dL 15 13 10   Creatinine 0.60 - 0.93 mg/dL 0.81 0.76 0.70  Sodium 135 - 146 mmol/L 140 141 141  Potassium 3.5 - 5.3 mmol/L 3.6 3.9 4.3  Chloride 98 - 110 mmol/L 102 106 106  CO2 20 - 32 mmol/L 31 28 29   Calcium 8.6 - 10.4 mg/dL 9.3 9.1 9.4  Total Protein 6.1 - 8.1 g/dL 6.8 6.5 7.1  Total Bilirubin 0.2 - 1.2 mg/dL 0.9 0.8 0.7  Alkaline Phos 33 - 130 U/L - - 79  AST 10 - 35 U/L 17 19 17   ALT 6 - 29 U/L 15 21 17     Imaging studies:  Left Forearm and Elbow X-rays (08/07/2018) - personally reviewed No acute bony abnormalities appreciated  Assessment/Plan:  77 y.o. female with prominent proximal forearm subcutaneous fat, Left > Right and Left medial > Left lateral just distal to antecubital fossa, complicated by co-morbidities including obesity (BMI 35), HTN, HLD.   - though it's not entirely clear whether patient's prominent Left medial proximal forearm subcutaneous fat represents a lipoma or rather asymmetry, surgical exploration and excision was offered if this location causes patient discomfort that may be potentially resolved with excision  - all  risks, benefits, and alternatives to excision of possible Left medial proximal forearm subcutaneous lipoma were discussed with the patient, all of her questions were answered to her expressed satisfaction, but patient expresses she does not wish to proceed with surgical excision of this lesion at this time.  - patient instructed to call if any questions, concerns, or if this area becomes bothersome enough for her to request it be excised  - return to clinic as needed  All of the above recommendations were discussed with the patient, and all of patient's questions were answered to her expressed satisfaction.  Thank you for the opportunity to participate in this patient's care.  -- Marilynne Drivers Rosana Hoes, MD, [place] - Partnering for exceptional care. Office: [phone]

## 2018-08-13 NOTE — Patient Instructions (Signed)
Please call our office if you have questions or concerns.   

## 2018-08-15 ENCOUNTER — Encounter: Payer: Self-pay | Admitting: Surgery

## 2018-09-08 ENCOUNTER — Other Ambulatory Visit: Payer: Self-pay | Admitting: Family Medicine

## 2018-09-08 DIAGNOSIS — Z1231 Encounter for screening mammogram for malignant neoplasm of breast: Secondary | ICD-10-CM

## 2018-09-29 ENCOUNTER — Ambulatory Visit
Admission: RE | Admit: 2018-09-29 | Discharge: 2018-09-29 | Disposition: A | Discharge status: Home / Self Care | Payer: Medicare Other | Source: Ambulatory Visit | Attending: Family Medicine | Admitting: Family Medicine

## 2018-09-29 DIAGNOSIS — Z1231 Encounter for screening mammogram for malignant neoplasm of breast: Secondary | ICD-10-CM | POA: Insufficient documentation

## 2018-09-30 ENCOUNTER — Other Ambulatory Visit: Payer: Self-pay | Admitting: Family Medicine

## 2018-09-30 DIAGNOSIS — R928 Other abnormal and inconclusive findings on diagnostic imaging of breast: Secondary | ICD-10-CM

## 2018-10-14 ENCOUNTER — Ambulatory Visit
Admission: RE | Admit: 2018-10-14 | Discharge: 2018-10-14 | Disposition: A | Discharge status: Home / Self Care | Payer: Medicare Other | Source: Ambulatory Visit | Attending: Family Medicine | Admitting: Family Medicine

## 2018-10-14 ENCOUNTER — Other Ambulatory Visit: Payer: Self-pay | Admitting: Family Medicine

## 2018-10-14 DIAGNOSIS — R928 Other abnormal and inconclusive findings on diagnostic imaging of breast: Secondary | ICD-10-CM

## 2018-10-14 DIAGNOSIS — N6320 Unspecified lump in the left breast, unspecified quadrant: Secondary | ICD-10-CM

## 2018-10-20 ENCOUNTER — Ambulatory Visit
Admission: RE | Admit: 2018-10-20 | Discharge: 2018-10-20 | Disposition: A | Discharge status: Home / Self Care | Payer: Medicare Other | Source: Ambulatory Visit | Attending: Family Medicine | Admitting: Family Medicine

## 2018-10-20 DIAGNOSIS — R928 Other abnormal and inconclusive findings on diagnostic imaging of breast: Secondary | ICD-10-CM

## 2018-10-20 DIAGNOSIS — N6320 Unspecified lump in the left breast, unspecified quadrant: Secondary | ICD-10-CM | POA: Insufficient documentation

## 2018-10-20 HISTORY — PX: BREAST BIOPSY: SHX20

## 2018-10-21 LAB — SURGICAL PATHOLOGY

## 2018-12-22 ENCOUNTER — Ambulatory Visit: Payer: Self-pay

## 2018-12-24 ENCOUNTER — Ambulatory Visit (INDEPENDENT_AMBULATORY_CARE_PROVIDER_SITE_OTHER): Payer: Medicare Other

## 2018-12-24 VITALS — BP 142/82 | HR 73 | Temp 97.7°F | Resp 16 | Ht 62.0 in | Wt 189.6 lb

## 2018-12-24 DIAGNOSIS — Z0000 Encounter for general adult medical examination without abnormal findings: Secondary | ICD-10-CM

## 2018-12-24 NOTE — Patient Instructions (Signed)
[name] , Thank you for taking time to come for your Medicare Wellness Visit. I appreciate your ongoing commitment to your health goals. Please review the following plan we discussed and let me know if I can assist you in the future.   Screening recommendations/referrals: Colonoscopy: Cologuard completed 05/19/17. Repeat in 2021. Mammogram: done 10/14/18 Bone Density: done 07/17/17. Repeat in Sept 2020. Recommended yearly ophthalmology/optometry visit for glaucoma screening and checkup Recommended yearly dental visit for hygiene and checkup  Vaccinations: Influenza vaccine: done 08/07/18 Pneumococcal vaccine: done 12/16/16 Tdap vaccine: done 08/12/16 Shingles vaccine: Shingrix discussed. Please contact your pharmacy for coverage information.     Advanced directives: Advance directive discussed with you today. Even though you declined this today please call our office should you change your mind and we can give you the proper paperwork for you to fill out.  Conditions/risks identified: Keep up the great work!  Next appointment: Please follow up in one year for your Medicare Annual Wellness visit.     Preventive Care 80 Years and Older, Female Preventive care refers to lifestyle choices and visits with your health care provider that can promote health and wellness. What does preventive care include?  A yearly physical exam. This is also called an annual well check.  Dental exams once or twice a year.  Routine eye exams. Ask your health care provider how often you should have your eyes checked.  Personal lifestyle choices, including:  Daily care of your teeth and gums.  Regular physical activity.  Eating a healthy diet.  Avoiding tobacco and drug use.  Limiting alcohol use.  Practicing safe sex.  Taking low-dose aspirin every day.  Taking vitamin and mineral supplements as recommended by your health care provider. What happens during an annual well check? The services and  screenings done by your health care provider during your annual well check will depend on your age, overall health, lifestyle risk factors, and family history of disease. Counseling  Your health care provider may ask you questions about your:  Alcohol use.  Tobacco use.  Drug use.  Emotional well-being.  Home and relationship well-being.  Sexual activity.  Eating habits.  History of falls.  Memory and ability to understand (cognition).  Work and work Statistician.  Reproductive health. Screening  You may have the following tests or measurements:  Height, weight, and BMI.  Blood pressure.  Lipid and cholesterol levels. These may be checked every 5 years, or more frequently if you are over 36 years old.  Skin check.  Lung cancer screening. You may have this screening every year starting at age 47 if you have a 30-pack-year history of smoking and currently smoke or have quit within the past 15 years.  Fecal occult blood test (FOBT) of the stool. You may have this test every year starting at age 22.  Flexible sigmoidoscopy or colonoscopy. You may have a sigmoidoscopy every 5 years or a colonoscopy every 10 years starting at age 68.  Hepatitis C blood test.  Hepatitis B blood test.  Sexually transmitted disease (STD) testing.  Diabetes screening. This is done by checking your blood sugar (glucose) after you have not eaten for a while (fasting). You may have this done every 1-3 years.  Bone density scan. This is done to screen for osteoporosis. You may have this done starting at age 66.  Mammogram. This may be done every 1-2 years. Talk to your health care provider about how often you should have regular mammograms. Talk with  your health care provider about your test results, treatment options, and if necessary, the need for more tests. Vaccines  Your health care provider may recommend certain vaccines, such as:  Influenza vaccine. This is recommended every  year.  Tetanus, diphtheria, and acellular pertussis (Tdap, Td) vaccine. You may need a Td booster every 10 years.  Zoster vaccine. You may need this after age 4.  Pneumococcal 13-valent conjugate (PCV13) vaccine. One dose is recommended after age 94.  Pneumococcal polysaccharide (PPSV23) vaccine. One dose is recommended after age 1. Talk to your health care provider about which screenings and vaccines you need and how often you need them. This information is not intended to replace advice given to you by your health care provider. Make sure you discuss any questions you have with your health care provider. Document Released: 11/24/2015 Document Revised: 07/17/2016 Document Reviewed: 08/29/2015 Elsevier Interactive Patient Education  2017 [place] Prevention in the Home Falls can cause injuries. They can happen to people of all ages. There are many things you can do to make your home safe and to help prevent falls. What can I do on the outside of my home?  Regularly fix the edges of walkways and driveways and fix any cracks.  Remove anything that might make you trip as you walk through a door, such as a raised step or threshold.  Trim any bushes or trees on the path to your home.  Use bright outdoor lighting.  Clear any walking paths of anything that might make someone trip, such as rocks or tools.  Regularly check to see if handrails are loose or broken. Make sure that both sides of any steps have handrails.  Any raised decks and porches should have guardrails on the edges.  Have any leaves, snow, or ice cleared regularly.  Use sand or salt on walking paths during winter.  Clean up any spills in your garage right away. This includes oil or grease spills. What can I do in the bathroom?  Use night lights.  Install grab bars by the toilet and in the tub and shower. Do not use towel bars as grab bars.  Use non-skid mats or decals in the tub or shower.  If you  need to sit down in the shower, use a plastic, non-slip stool.  Keep the floor dry. Clean up any water that spills on the floor as soon as it happens.  Remove soap buildup in the tub or shower regularly.  Attach bath mats securely with double-sided non-slip rug tape.  Do not have throw rugs and other things on the floor that can make you trip. What can I do in the bedroom?  Use night lights.  Make sure that you have a light by your bed that is easy to reach.  Do not use any sheets or blankets that are too big for your bed. They should not hang down onto the floor.  Have a firm chair that has side arms. You can use this for support while you get dressed.  Do not have throw rugs and other things on the floor that can make you trip. What can I do in the kitchen?  Clean up any spills right away.  Avoid walking on wet floors.  Keep items that you use a lot in easy-to-reach places.  If you need to reach something above you, use a strong step stool that has a grab bar.  Keep electrical cords out of the way.  Do not  use floor polish or wax that makes floors slippery. If you must use wax, use non-skid floor wax.  Do not have throw rugs and other things on the floor that can make you trip. What can I do with my stairs?  Do not leave any items on the stairs.  Make sure that there are handrails on both sides of the stairs and use them. Fix handrails that are broken or loose. Make sure that handrails are as long as the stairways.  Check any carpeting to make sure that it is firmly attached to the stairs. Fix any carpet that is loose or worn.  Avoid having throw rugs at the top or bottom of the stairs. If you do have throw rugs, attach them to the floor with carpet tape.  Make sure that you have a light switch at the top of the stairs and the bottom of the stairs. If you do not have them, ask someone to add them for you. What else can I do to help prevent falls?  Wear shoes  that:  Do not have high heels.  Have rubber bottoms.  Are comfortable and fit you well.  Are closed at the toe. Do not wear sandals.  If you use a stepladder:  Make sure that it is fully opened. Do not climb a closed stepladder.  Make sure that both sides of the stepladder are locked into place.  Ask someone to hold it for you, if possible.  Clearly mark and make sure that you can see:  Any grab bars or handrails.  First and last steps.  Where the edge of each step is.  Use tools that help you move around (mobility aids) if they are needed. These include:  Canes.  Walkers.  Scooters.  Crutches.  Turn on the lights when you go into a dark area. Replace any light bulbs as soon as they burn out.  Set up your furniture so you have a clear path. Avoid moving your furniture around.  If any of your floors are uneven, fix them.  If there are any pets around you, be aware of where they are.  Review your medicines with your doctor. Some medicines can make you feel dizzy. This can increase your chance of falling. Ask your doctor what other things that you can do to help prevent falls. This information is not intended to replace advice given to you by your health care provider. Make sure you discuss any questions you have with your health care provider. Document Released: 08/24/2009 Document Revised: 04/04/2016 Document Reviewed: 12/02/2014 Elsevier Interactive Patient Education  2017 [employer].

## 2018-12-24 NOTE — Progress Notes (Signed)
Subjective:   Susan Summers is a 78 y.o. female who presents for Medicare Annual (Subsequent) preventive examination.  Review of Systems:   Cardiac Risk Factors include: advanced age (>9men, >2 women);hypertension;dyslipidemia;obesity (BMI >30kg/m2)     Objective:     Vitals: BP (!) 142/82 (BP Location: Right Arm, Patient Position: Sitting, Cuff Size: Normal)   Pulse 73   Temp 97.7 F (36.5 C) (Oral)   Resp 16   Ht 5\' 2"  (1.575 m)   Wt 189 lb 9.6 oz (86 kg)   SpO2 98%   BMI 34.68 kg/m   Body mass index is 34.68 kg/m.  Advanced Directives 12/24/2018 12/19/2017 08/07/2017 03/12/2017 02/10/2017 12/16/2016 11/13/2016  Does Patient Have a Medical Advance Directive? No Yes Yes Yes Yes Yes Yes  Type of Advance Directive - [place];Living will - Living will Living will Living will Living will  Does patient want to make changes to medical advance directive? - - - - - - -  Copy of [place] in Chart? - No - copy requested - - - - -  Would patient like information on creating a medical advance directive? No - Patient declined - - - - - -    Tobacco Social History   Tobacco Use  Smoking Status Never Smoker  Smokeless Tobacco Never Used  Tobacco Comment   smoking cessation materials not required     Counseling given: Not Answered Comment: smoking cessation materials not required   Clinical Intake:  Pre-visit preparation completed: Yes  Pain : No/denies pain     BMI - recorded: 34.68 Nutritional Status: BMI > 30  Obese Nutritional Risks: None Diabetes: No  How often do you need to have someone help you when you read instructions, pamphlets, or other written materials from your doctor or pharmacy?: 1 - Never What is the last grade level you completed in school?: 12th grade  Interpreter Needed?: No  Information entered by :: Clemetine Marker LPN  Past Medical History:  Diagnosis Date  . Hyperlipidemia   . Hypertension   . Osteopenia     Past Surgical History:  Procedure Laterality Date  . ABDOMINAL HYSTERECTOMY    . APPENDECTOMY    . BREAST BIOPSY Left 10/20/2018   [place] bx, path pending  . HERNIA REPAIR    . oopherrectomy  2012  . UMBILICAL HERNIA REPAIR  2012   Family History  Problem Relation Age of Onset  . Hypertension Mother   . Stroke Mother   . Rheum arthritis Mother   . Diabetes Father   . Cancer Sister        Ovarian  . Diabetes Sister   . Hypertension Brother   . Melanoma Daughter   . Hypertension Son   . Hyperlipidemia Son   . Stroke Brother   . Cancer Brother        Prostate  . Other Brother        bowel obstruction  . Hyperlipidemia Sister   . Cancer Sister        skin- SCC removed   Social History   Socioeconomic History  . Marital status: Widowed    Spouse name: Zenia Resides  . Number of children: 2  . Years of education: Not on file  . Highest education level: 12th grade  Occupational History  . Occupation: Retired  Scientific laboratory technician  . Financial resource strain: Not hard at all  . Food insecurity:    Worry: Never true  Inability: Never true  . Transportation needs:    Medical: No    Non-medical: No  Tobacco Use  . Smoking status: Never Smoker  . Smokeless tobacco: Never Used  . Tobacco comment: smoking cessation materials not required  Substance and Sexual Activity  . Alcohol use: No    Alcohol/week: 0.0 standard drinks  . Drug use: No  . Sexual activity: Not Currently  Lifestyle  . Physical activity:    Days per week: 5 days    Minutes per session: 30 min  . Stress: Not at all  Relationships  . Social connections:    Talks on phone: More than three times a week    Gets together: Three times a week    Attends religious service: More than 4 times per year    Active member of club or organization: Yes    Attends meetings of clubs or organizations: More than 4 times per year    Relationship status: Widowed  Other Topics Concern  . Not on file  Social History Narrative   . Not on file    Outpatient Encounter Medications as of 12/24/2018  Medication Sig  . Cholecalciferol (VITAMIN D PO) Take 4,000 Units by mouth daily.   Marland Kitchen ELDERBERRY PO Take 1,250 mg by mouth daily.   . Garlic 161 MG TABS Take by mouth daily.  . hydrochlorothiazide (MICROZIDE) 12.5 MG capsule TAKE 1 CAPSULE BY MOUTH ONCE DAILY  . Misc Natural Products (TART CHERRY ADVANCED PO) Take 1 capsule by mouth daily.  . Omega-3 Fatty Acids (FISH OIL) 1000 MG CAPS Take 2 capsules by mouth daily.   . Probiotic Product (PROBIOTIC DAILY PO) Take by mouth daily.   . rosuvastatin (CRESTOR) 20 MG tablet TAKE 1 TABLET BY MOUTH DAILY AT BEDTIME  . telmisartan (MICARDIS) 80 MG tablet Take 1 tablet (80 mg total) by mouth daily.  . Turmeric 400 MG CAPS Take 2 capsules by mouth daily.   No facility-administered encounter medications on file as of 12/24/2018.     Activities of Daily Living In your present state of health, do you have any difficulty performing the following activities: 12/24/2018 08/07/2018  Hearing? N N  Comment declines hearing aids -  Vision? N N  Comment wears glasses -  Difficulty concentrating or making decisions? N N  Walking or climbing stairs? N N  Dressing or bathing? N N  Doing errands, shopping? N N  Preparing Food and eating ? N -  Using the Toilet? N -  In the past six months, have you accidently leaked urine? Y -  Comment pt wears pads for protection -  Do you have problems with loss of bowel control? N -  Managing your Medications? N -  Managing your Finances? N -  Housekeeping or managing your Housekeeping? N -  Some recent data might be hidden    Patient Care Team: Lada, Satira Anis, MD as PCP - General (Family Medicine) Karren Burly Deirdre Peer, MD as Referring Physician (Ophthalmology)    Assessment:   This is a routine wellness examination for [place].  Exercise Activities and Dietary recommendations Current Exercise Habits: Home exercise routine, Time (Minutes):  30, Frequency (Times/Week): 5, Weekly Exercise (Minutes/Week): 150, Intensity: Mild  Goals    . DIET - INCREASE WATER INTAKE     Recommend to drink at least 6-8 8oz glasses of water per day.    . Exercise      Starting 12/16/16, I will start exercising 3 days week for 30  minutes.        Fall Risk Fall Risk  12/24/2018 08/07/2018 02/04/2018 12/19/2017 11/06/2017  Falls in the past year? 0 No No No No  Comment - - - - -  Number falls in past yr: 0 - - - -  Injury with Fall? 0 - - - -  Risk for fall due to : - - - History of fall(s);Impaired vision -  Risk for fall due to: Comment - - - fallen in the yard; wears eyeglasses -  Follow up Falls prevention discussed - - - -   FALL RISK PREVENTION PERTAINING TO THE HOME:  Any stairs in or around the home? Yes  If so, are they are without handrails? Yes   Home free of loose throw rugs in walkways, pet beds, electrical cords, etc? Yes  Adequate lighting in your home to reduce risk of falls? Yes   ASSISTIVE DEVICES UTILIZED TO PREVENT FALLS:  Life alert? No  Use of a cane, walker or w/c? No  Grab bars in the bathroom? Yes  Shower chair or bench in shower? No  Elevated toilet seat or a handicapped toilet? Yes   DME ORDERS:  DME order needed?  No   TIMED UP AND GO:  Was the test performed? Yes .  Length of time to ambulate 10 feet: 5 sec.   GAIT:  Appearance of gait: Gait stead-fast and without the use of an assistive device.   Education: Fall risk prevention has been discussed.  Intervention(s) required? No   Depression Screen PHQ 2/9 Scores 12/24/2018 08/07/2018 02/04/2018 12/19/2017  PHQ - 2 Score 0 0 0 0     Cognitive Function     6CIT Screen 12/24/2018 12/19/2017 12/16/2016  What Year? 0 points 0 points 0 points  What month? 0 points 0 points 0 points  What time? 0 points 0 points 0 points  Count back from 20 0 points 0 points 0 points  Months in reverse 0 points 0 points 0 points  Repeat phrase 0 points 2 points 0 points   Total Score 0 2 0    Immunization History  Administered Date(s) Administered  . Influenza, High Dose Seasonal PF 08/07/2017, 08/07/2018  . Influenza-Unspecified 07/12/2014, 08/02/2015, 08/12/2016  . Pneumococcal Conjugate-13 07/26/2014  . Pneumococcal Polysaccharide-23 12/16/2016  . Td 11/12/2003  . Tdap 08/12/2016    Qualifies for Shingles Vaccine? Yes . Due for Shingrix. Education has been provided regarding the importance of this vaccine. Pt has been advised to call insurance company to determine out of pocket expense. Advised may also receive vaccine at local pharmacy or Health Dept. Verbalized acceptance and understanding.  Tdap: Up to date  Flu Vaccine: Up to date  Pneumococcal Vaccine: Up to date   Screening Tests Health Maintenance  Topic Date Due  . MAMMOGRAM  09/30/2019  . Fecal DNA (Cologuard)  05/20/2020  . TETANUS/TDAP  08/12/2026  . INFLUENZA VACCINE  Completed  . DEXA SCAN  Completed  . PNA vac Low Risk Adult  Completed    Cancer Screenings:  Colorectal Screening: Cologuard Completed 05/19/17. Repeat every 3 years  Mammogram: Completed 09/29/18. Repeat every year  Bone Density: Completed 07/17/17. Results reflect NORMAL.  Repeat every 2 years.    Lung Cancer Screening: (Low Dose CT Chest recommended if Age 41-80 years, 30 pack-year currently smoking OR have quit w/in 15years.) does not qualify.   Additional Screening:  Hepatitis C Screening: no longer required  Vision Screening: Recommended annual ophthalmology exams for early  detection of glaucoma and other disorders of the eye. Is the patient up to date with their annual eye exam?  Yes  Who is the provider or what is the name of the office in which the pt attends annual eye exams? [place] Screening: Recommended annual dental exams for proper oral hygiene  Community Resource Referral:  CRR required this visit?  No      Plan:     I have personally reviewed and addressed  the Medicare Annual Wellness questionnaire and have noted the following in the patient's chart:  A. Medical and social history B. Use of alcohol, tobacco or illicit drugs  C. Current medications and supplements D. Functional ability and status E.  Nutritional status F.  Physical activity G. Advance directives H. List of other physicians I.  Hospitalizations, surgeries, and ER visits in previous 12 months J.  [place] such as hearing and vision if needed, cognitive and depression L. Referrals and appointments   In addition, I have reviewed and discussed with patient certain preventive protocols, quality metrics, and best practice recommendations. A written personalized care plan for preventive services as well as general preventive health recommendations were provided to patient.   Signed,  Clemetine Marker, LPN Nurse Health Advisor   Nurse Notes:pt doing well and appreciative of visit today

## 2019-01-28 ENCOUNTER — Other Ambulatory Visit: Payer: Self-pay | Admitting: Family Medicine

## 2019-01-28 DIAGNOSIS — I10 Essential (primary) hypertension: Secondary | ICD-10-CM

## 2019-01-28 DIAGNOSIS — E785 Hyperlipidemia, unspecified: Secondary | ICD-10-CM

## 2019-01-28 NOTE — Telephone Encounter (Signed)
Lab Results  Component Value Date   CREATININE 0.81 08/07/2018   Lab Results  Component Value Date   K 3.6 08/07/2018   Lab Results  Component Value Date   ALT 15 08/07/2018   Lab Results  Component Value Date   CHOL 115 08/07/2018   HDL 29 (L) 08/07/2018   LDLCALC 60 08/07/2018   TRIG 193 (H) 08/07/2018   CHOLHDL 4.0 08/07/2018

## 2019-02-05 ENCOUNTER — Ambulatory Visit: Payer: Self-pay | Admitting: Family Medicine

## 2019-02-26 ENCOUNTER — Ambulatory Visit (INDEPENDENT_AMBULATORY_CARE_PROVIDER_SITE_OTHER): Payer: Medicare Other | Admitting: Family Medicine

## 2019-02-26 ENCOUNTER — Other Ambulatory Visit: Payer: Self-pay

## 2019-02-26 ENCOUNTER — Encounter: Payer: Self-pay | Admitting: Family Medicine

## 2019-02-26 ENCOUNTER — Telehealth: Payer: Self-pay | Admitting: Family Medicine

## 2019-02-26 VITALS — BP 129/59 | HR 78 | Temp 96.6°F | Ht 62.0 in | Wt 189.0 lb

## 2019-02-26 DIAGNOSIS — E7800 Pure hypercholesterolemia, unspecified: Secondary | ICD-10-CM | POA: Diagnosis not present

## 2019-02-26 DIAGNOSIS — Z5181 Encounter for therapeutic drug level monitoring: Secondary | ICD-10-CM | POA: Diagnosis not present

## 2019-02-26 DIAGNOSIS — E669 Obesity, unspecified: Secondary | ICD-10-CM

## 2019-02-26 DIAGNOSIS — R7303 Prediabetes: Secondary | ICD-10-CM

## 2019-02-26 DIAGNOSIS — M8580 Other specified disorders of bone density and structure, unspecified site: Secondary | ICD-10-CM

## 2019-02-26 DIAGNOSIS — I10 Essential (primary) hypertension: Secondary | ICD-10-CM

## 2019-02-26 NOTE — Assessment & Plan Note (Signed)
Last DEXA 2018; next due fall 2020; fall precautions encouraged; calcium intake

## 2019-02-26 NOTE — Telephone Encounter (Signed)
Please schedule patient for an appointment With whom: Dr. Sanda Klein When: 6 months How: In person Why: hypertension, obesity, high cholesterol Thank you

## 2019-02-26 NOTE — Assessment & Plan Note (Signed)
Limit saturated fats; continue statin 

## 2019-02-26 NOTE — Assessment & Plan Note (Signed)
Encouragement given; little changes add up, so limit portions and maybe have a smaller serving

## 2019-02-26 NOTE — Progress Notes (Signed)
BP (!) 129/59 (BP Location: Left Arm, Patient Position: Standing, Cuff Size: Normal)   Pulse 78   Temp (!) 96.6 F (35.9 C) (Oral)   Ht 5\' 2"  (1.575 m)   Wt 189 lb (85.7 kg)   BMI 34.57 kg/m    Subjective:    Patient ID: Susan Summers, female    DOB: October 04, 1941, 78 y.o.   MRN: [id number]  HPI: Susan Summers is a 78 y.o. female  Chief Complaint  Patient presents with  . Follow-up    HPI Virtual Visit via Telephone/Video Note   I connected with the patient via: telephone  I verified that I am speaking with the correct person using two identifiers.  Call started: 3:05 pm Call terminated: 3:21 pm Total length of call: 16 minutes and 55 seconds   I discussed the limitations, risks, and privacy concerns of performing an evaluation and management service by telephone and the availability of in-person appointments. I explained that he/she may be responsible for charges related to this service. The patient expressed understanding and agreed to proceed.  Provider location: home, upstairs office with door closed, earphones/headset on Patient location: house Additional participants: no one  No exposures to COVID-19, in the house essentially since March 12th  HTN; her eating habits have changed for the better, healthier stuff; she does not cook with salt; she rinses out canned veggies; buys low sodium; last BP 129/59 today, patient checked; it's usually not that low, usually in the 60's; she mowed the grass this morning; maybe a little dehydrated, did not drink water this morning; not dizzy; she thinks it was just the mowing and not drinking  High cholesterol; taking rosuvastatin; no muscle aches usually, just an occasional ache here and there in the legs, but not stretching; not much meat; beans or chicken, fish  Lab Results  Component Value Date   CHOL 115 08/07/2018   HDL 29 (L) 08/07/2018   LDLCALC 60 08/07/2018   TRIG 193 (H) 08/07/2018   CHOLHDL 4.0 08/07/2018     Prediabetes; she is trying to eat better; no sugary drinks; just water; almond milk instead of cow milk; no dry mouth or blurred vision; not eating many sweets  Lab Results  Component Value Date   HGBA1C 5.8 (H) 08/07/2018   Obesity; she has not gained any weight; 189 pounds at home, lower here at the office; she got down a few weeks ago, down to 185 pounds, then she gained it back; maybe eating more  Osteopenia; last dexa sept 2018; she gets almond milk; active  Depression screen [facility] 2/9 12/24/2018 08/07/2018 02/04/2018 12/19/2017 11/06/2017  Decreased Interest 0 0 0 0 0  Down, Depressed, Hopeless 0 0 0 0 0  PHQ - 2 Score 0 0 0 0 0   Fall Risk  02/26/2019 12/24/2018 08/07/2018 02/04/2018 12/19/2017  Falls in the past year? 0 0 No No No  Comment - - - - -  Number falls in past yr: - 0 - - -  Injury with Fall? - 0 - - -  Risk for fall due to : - - - - History of fall(s);Impaired vision  Risk for fall due to: Comment - - - - fallen in the yard; wears eyeglasses  Follow up - Falls prevention discussed - - -    Relevant past medical, surgical, family and social history reviewed Past Medical History:  Diagnosis Date  . Hyperlipidemia   . Hypertension   . Osteopenia  Past Surgical History:  Procedure Laterality Date  . ABDOMINAL HYSTERECTOMY    . APPENDECTOMY    . BREAST BIOPSY Left 10/20/2018   [place] bx, path pending  . HERNIA REPAIR    . oopherrectomy  2012  . UMBILICAL HERNIA REPAIR  2012   Family History  Problem Relation Age of Onset  . Hypertension Mother   . Stroke Mother   . Rheum arthritis Mother   . Diabetes Father   . Cancer Sister        Ovarian  . Diabetes Sister   . Hypertension Brother   . Melanoma Daughter   . Hypertension Son   . Hyperlipidemia Son   . Stroke Brother   . Cancer Brother        Prostate  . Other Brother        bowel obstruction  . Hyperlipidemia Sister   . Cancer Sister        skin- SCC removed   Social History   Tobacco Use  . Smoking  status: Never Smoker  . Smokeless tobacco: Never Used  . Tobacco comment: smoking cessation materials not required  Substance Use Topics  . Alcohol use: No    Alcohol/week: 0.0 standard drinks  . Drug use: No     Office Visit from 02/26/2019 in [facility]  AUDIT-C Score  0      Interim medical history since last visit reviewed. Allergies and medications reviewed  Review of Systems Per HPI unless specifically indicated above     Objective:    BP (!) 129/59 (BP Location: Left Arm, Patient Position: Standing, Cuff Size: Normal)   Pulse 78   Temp (!) 96.6 F (35.9 C) (Oral)   Ht 5\' 2"  (1.575 m)   Wt 189 lb (85.7 kg)   BMI 34.57 kg/m   Wt Readings from Last 3 Encounters:  02/26/19 189 lb (85.7 kg)  12/24/18 189 lb 9.6 oz (86 kg)  08/13/18 189 lb (85.7 kg)    Physical Exam Pulmonary:     Effort: No respiratory distress.  Neurological:     Mental Status: She is alert.     Cranial Nerves: No dysarthria.  Psychiatric:        Speech: Speech is not rapid and pressured, delayed or slurred.     Results for orders placed or performed during the hospital encounter of 10/20/18  Surgical pathology  Result Value Ref Range   SURGICAL PATHOLOGY      Surgical Pathology CASE: [id number] PATIENT: Calysta Cordial Surgical Pathology Report     SPECIMEN SUBMITTED: A. Breast, left  CLINICAL HISTORY: Dilated duct on screening mammo.  Possible intraductal mass on diagnostic exam  PRE-OPERATIVE DIAGNOSIS: Papilloma, FCC r/o DCIS and malignancy  POST-OPERATIVE DIAGNOSIS: None provided.     DIAGNOSIS: A. BREAST, LEFT 1:00, 3 CM FROM NIPPLE; STEREOTACTIC BIOPSY: - BENIGN CYST WALL LINING. - FIBROCYSTIC CHANGE WITH APOCRINE METAPLASIA. - NEGATIVE FOR PAPILLARY LESION, ATYPICAL DUCTAL HYPERPLASIA AND MALIGNANCY.   GROSS DESCRIPTION: A. Labeled: Left breast 1:00, 3 cm from nipple Received: In formalin Time/date in fixative: 9:35 AM on 10/20/2018  Cold ischemic time: Less than 5 minutes Total fixation time: 8 hours Core pieces: aggregate, 1.4 x 1.1 x 0.1 Description: Yellow to tan core and tissue fragments Ink color: Black Entirely submitted in 1 cassette.    Final Diagnosis performed by Raynelle Bring, MD .   Electronically signed 10/21/2018 12:03:45PM The electronic signature indicates that the named Attending Pathologist has evaluated the specimen  Technical component performed at [facility], [address] [place], [place] [address] Lab: [phone] Dir: Rush Farmer, MD, MMM  Professional component performed at [facility], [facility], [place], [place], [place] [address] Lab: [phone] Dir: Dellia Nims. Rubinas, MD       Assessment & Plan:   Problem List Items Addressed This Visit      Cardiovascular and Mediastinum   Benign hypertension - Primary (Chronic)    Hydrate really well; check BP and let me know if symptomatic (dizzy or light-headed) or pressures less than 60 on the bottom        Musculoskeletal and Integument   Osteopenia of the elderly    Last DEXA 2018; next due fall 2020; fall precautions encouraged; calcium intake        Other   Prediabetes (Chronic)    Check A1c when she is comfortable coming in for labs      Relevant Orders   Hemoglobin A1c   Obesity (BMI 30.0-34.9)    Encouragement given; little changes add up, so limit portions and maybe have a smaller serving      Hyperlipidemia (Chronic)    Limit saturated fats; continue statin      Relevant Orders   Lipid panel    Other Visit Diagnoses    Medication monitoring encounter       Relevant Orders   COMPLETE METABOLIC PANEL WITH GFR      Follow up plan: Return in about 6 months (around 08/28/2019) for follow-up visit with Dr. Sanda Klein.  No orders of the defined types were placed in this encounter.  Orders Placed This Encounter  Procedures  . COMPLETE METABOLIC PANEL WITH GFR  . Lipid panel  . Hemoglobin  A1c

## 2019-02-26 NOTE — Assessment & Plan Note (Signed)
Check A1c when she is comfortable coming in for labs

## 2019-02-26 NOTE — Assessment & Plan Note (Signed)
Hydrate really well; check BP and let me know if symptomatic (dizzy or light-headed) or pressures less than 60 on the bottom

## 2019-03-02 NOTE — Telephone Encounter (Signed)
Please schedule f/u appt.

## 2019-03-03 NOTE — Telephone Encounter (Signed)
Done

## 2019-07-22 ENCOUNTER — Other Ambulatory Visit: Payer: Self-pay | Admitting: Family Medicine

## 2019-07-22 DIAGNOSIS — I10 Essential (primary) hypertension: Secondary | ICD-10-CM

## 2019-07-22 DIAGNOSIS — E785 Hyperlipidemia, unspecified: Secondary | ICD-10-CM

## 2019-08-30 ENCOUNTER — Other Ambulatory Visit: Payer: Self-pay

## 2019-08-30 ENCOUNTER — Encounter: Payer: Self-pay | Admitting: Family Medicine

## 2019-08-30 ENCOUNTER — Ambulatory Visit (INDEPENDENT_AMBULATORY_CARE_PROVIDER_SITE_OTHER): Payer: Medicare Other | Admitting: Family Medicine

## 2019-08-30 VITALS — BP 136/84 | HR 67 | Temp 98.3°F | Resp 14 | Ht 62.0 in | Wt 183.8 lb

## 2019-08-30 DIAGNOSIS — E669 Obesity, unspecified: Secondary | ICD-10-CM | POA: Diagnosis not present

## 2019-08-30 DIAGNOSIS — R7303 Prediabetes: Secondary | ICD-10-CM

## 2019-08-30 DIAGNOSIS — Z5181 Encounter for therapeutic drug level monitoring: Secondary | ICD-10-CM

## 2019-08-30 DIAGNOSIS — Z1231 Encounter for screening mammogram for malignant neoplasm of breast: Secondary | ICD-10-CM

## 2019-08-30 DIAGNOSIS — E7800 Pure hypercholesterolemia, unspecified: Secondary | ICD-10-CM

## 2019-08-30 DIAGNOSIS — M8589 Other specified disorders of bone density and structure, multiple sites: Secondary | ICD-10-CM

## 2019-08-30 DIAGNOSIS — I10 Essential (primary) hypertension: Secondary | ICD-10-CM | POA: Diagnosis not present

## 2019-08-30 NOTE — Patient Instructions (Signed)
Calcium and Vit D supplement Vit D 1000 IU and 1200 mg Calcium or in foods  Osteopenia  Osteopenia is a loss of thickness (density) inside of the bones. Another name for osteopenia is low bone mass. Mild osteopenia is a normal part of aging. It is not a disease, and it does not cause symptoms. However, if you have osteopenia and continue to lose bone mass, you could develop a condition that causes the bones to become thin and break more easily (osteoporosis). You may also lose some height, have back pain, and have a stooped posture. Although osteopenia is not a disease, making changes to your lifestyle and diet can help to prevent osteopenia from developing into osteoporosis. What are the causes? Osteopenia is caused by loss of calcium in the bones.  Bones are constantly changing. Old bone cells are continually being replaced with new bone cells. This process builds new bone. The mineral calcium is needed to build new bone and maintain bone density. Bone density is usually highest around age 64. After that, most people's bodies cannot replace all the bone they have lost with new bone. What increases the risk? You are more likely to develop this condition if:  You are older than age 60.  You are a woman who went through menopause early.  You have a long illness that keeps you in bed.  You do not get enough exercise.  You lack certain nutrients (malnutrition).  You have an overactive thyroid gland (hyperthyroidism).  You smoke.  You drink a lot of alcohol.  You are taking medicines that weaken the bones, such as steroids. What are the signs or symptoms? This condition does not cause any symptoms. You may have a slightly higher risk for bone breaks (fractures), so getting fractures more easily than normal may be an indication of osteopenia. How is this diagnosed? Your health care provider can diagnose this condition with a special type of X-ray exam that measures bone density  (dual-energy X-ray absorptiometry, DEXA). This test can measure bone density in your hips, spine, and wrists. Osteopenia has no symptoms, so this condition is usually diagnosed after a routine bone density screening test is done for osteoporosis. This routine screening is usually done for:  Women who are age 59 or older.  Men who are age 55 or older. If you have risk factors for osteopenia, you may have the screening test at an earlier age. How is this treated? Making dietary and lifestyle changes can lower your risk for osteoporosis. If you have severe osteopenia that is close to becoming osteoporosis, your health care provider may prescribe medicines and dietary supplements such as calcium and vitamin D. These supplements help to rebuild bone density. Follow these instructions at home:   Take over-the-counter and prescription medicines only as told by your health care provider. These include vitamins and supplements.  Eat a diet that is high in calcium and vitamin D. ? Calcium is found in dairy products, beans, salmon, and leafy green vegetables like spinach and broccoli. ? Look for foods that have vitamin D and calcium added to them (fortified foods), such as orange juice, cereal, and bread.  Do 30 or more minutes of a weight-bearing exercise every day, such as walking, jogging, or playing a sport. These types of exercises strengthen the bones.  Take precautions at home to lower your risk of falling, such as: ? Keeping rooms well-lit and free of clutter, such as cords. ? Installing safety rails on stairs. ? Using rubber  mats in the bathroom or other areas that are often wet or slippery.  Do not use any products that contain nicotine or tobacco, such as cigarettes and e-cigarettes. If you need help quitting, ask your health care provider.  Avoid alcohol or limit alcohol intake to no more than 1 drink a day for nonpregnant women and 2 drinks a day for men. One drink equals 12 oz of beer,  5 oz of wine, or 1 oz of hard liquor.  Keep all follow-up visits as told by your health care provider. This is important. Contact a health care provider if:  You have not had a bone density screening for osteoporosis and you are: ? A woman, age 44 or older. ? A man, age 84 or older.  You are a postmenopausal woman who has not had a bone density screening for osteoporosis.  You are older than age 31 and you want to know if you should have bone density screening for osteoporosis. Summary  Osteopenia is a loss of thickness (density) inside of the bones. Another name for osteopenia is low bone mass.  Osteopenia is not a disease, but it may increase your risk for a condition that causes the bones to become thin and break more easily (osteoporosis).  You may be at risk for osteopenia if you are older than age 67 or if you are a woman who went through early menopause.  Osteopenia does not cause any symptoms, but it can be diagnosed with a bone density screening test.  Dietary and lifestyle changes are the first treatment for osteopenia. These may lower your risk for osteoporosis. This information is not intended to replace advice given to you by your health care provider. Make sure you discuss any questions you have with your health care provider. Document Released: 08/06/2017 Document Revised: 10/10/2017 Document Reviewed: 08/06/2017 Elsevier Patient Education  2020 [employer].

## 2019-08-30 NOTE — Progress Notes (Signed)
Name: Susan Summers   MRN: [id number]    DOB: 05/12/1941   Date:08/30/2019       Progress Note  Chief Complaint  Patient presents with  . Follow-up  . Hypertension  . Hyperlipidemia     Subjective:   Susan Summers is a 78 y.o. female, presents to clinic for routine follow up on the conditions listed above.   Hypertension:   Currently managed on telmisartan 80 mg and HCTZ 12.5 Pt reports good med compliance and denies any SE.  No lightheadedness, hypotension, syncope. Blood pressure today is well controlled. BP Readings from Last 3 Encounters:  08/30/19 136/84  02/26/19 (!) 129/59  12/24/18 (!) 142/82   Pt denies CP, SOB, exertional sx, LE edema, palpitation, Ha's, visual disturbances Dietary efforts for BP?  Low salt, walking  Obesity - BMI today down to 33 Lost weight with exercising daily and working on diet Wt Readings from Last 5 Encounters:  08/30/19 183 lb 12.8 oz (83.4 kg)  02/26/19 189 lb (85.7 kg)  12/24/18 189 lb 9.6 oz (86 kg)  08/13/18 189 lb (85.7 kg)  08/07/18 188 lb (85.3 kg)   BMI Readings from Last 5 Encounters:  08/30/19 33.62 kg/m  02/26/19 34.57 kg/m  12/24/18 34.68 kg/m  08/13/18 34.57 kg/m  08/07/18 34.39 kg/m   Hyperlipidemia: Current Medicdtion Regimen:  Crestor 20 mg Last Lipids:  Lab Results  Component Value Date   CHOL 115 08/07/2018   HDL 29 (L) 08/07/2018   LDLCALC 60 08/07/2018   TRIG 193 (H) 08/07/2018   CHOLHDL 4.0 08/07/2018   - Current Diet:  Healthy diet - walking 2 miles a day - Denies: Chest pain, shortness of breath, myalgias. - Documented aortic atherosclerosis? No - Risk factors for atherosclerosis: hypercholesterolemia and hypertension  Prediabetes -  Last was 5.8%, again diet efforts as above, but she loves sweets and treats, has been trying to cut back.  No LE edema worsening  Patient Active Problem List   Diagnosis Date Noted  . Obesity (BMI 30.0-34.9) 08/07/2018  . At risk for falling 02/04/2018   . Cramp in muscle 02/04/2018  . H/O malignant neoplasm of skin 02/04/2018  . Herpes zona 02/04/2018  . Need for prophylactic vaccination and inoculation against influenza 02/04/2018  . Osteopenia of the elderly 02/04/2018  . Post menopausal syndrome 02/04/2018  . Psoriasis 02/04/2018  . Chronic lower back pain 02/04/2018  . Prediabetes 11/13/2016  . Dermatitis 01/10/2016  . Swelling of both ankles 08/09/2015  . Benign hypertension 04/25/2015  . Hyperlipidemia 04/25/2015  . Plantar fasciitis 04/25/2015    Past Surgical History:  Procedure Laterality Date  . ABDOMINAL HYSTERECTOMY    . APPENDECTOMY    . BREAST BIOPSY Left 10/20/2018   [place] bx, path pending  . HERNIA REPAIR    . oopherrectomy  2012  . UMBILICAL HERNIA REPAIR  2012    Family History  Problem Relation Age of Onset  . Hypertension Mother   . Stroke Mother   . Rheum arthritis Mother   . Diabetes Father   . Cancer Sister        Ovarian  . Diabetes Sister   . Hypertension Brother   . Melanoma Daughter   . Hypertension Son   . Hyperlipidemia Son   . Stroke Brother   . Cancer Brother        Prostate  . Other Brother        bowel obstruction  . Hyperlipidemia Sister   .  Cancer Sister        skin- SCC removed    Social History   Socioeconomic History  . Marital status: Widowed    Spouse name: Zenia Resides  . Number of children: 2  . Years of education: Not on file  . Highest education level: 12th grade  Occupational History  . Occupation: Retired  Scientific laboratory technician  . Financial resource strain: Not hard at all  . Food insecurity    Worry: Never true    Inability: Never true  . Transportation needs    Medical: No    Non-medical: No  Tobacco Use  . Smoking status: Never Smoker  . Smokeless tobacco: Never Used  . Tobacco comment: smoking cessation materials not required  Substance and Sexual Activity  . Alcohol use: No    Alcohol/week: 0.0 standard drinks  . Drug use: No  . Sexual activity: Not  Currently  Lifestyle  . Physical activity    Days per week: 5 days    Minutes per session: 30 min  . Stress: Not at all  Relationships  . Social connections    Talks on phone: More than three times a week    Gets together: Three times a week    Attends religious service: More than 4 times per year    Active member of club or organization: Yes    Attends meetings of clubs or organizations: More than 4 times per year    Relationship status: Widowed  . Intimate partner violence    Fear of current or ex partner: No    Emotionally abused: No    Physically abused: No    Forced sexual activity: No  Other Topics Concern  . Not on file  Social History Narrative  . Not on file     Current Outpatient Medications:  .  Cholecalciferol (VITAMIN D PO), Take 4,000 Units by mouth daily. , Disp: , Rfl:  .  ELDERBERRY PO, Take 1,250 mg by mouth daily. , Disp: , Rfl:  .  Garlic 123XX123 MG TABS, Take by mouth daily., Disp: , Rfl:  .  hydrochlorothiazide (MICROZIDE) 12.5 MG capsule, TAKE 1 CAPSULE BY MOUTH ONCE DAILY, Disp: 90 capsule, Rfl: 1 .  Misc Natural Products (TART CHERRY ADVANCED PO), Take 1 capsule by mouth daily., Disp: , Rfl:  .  Omega-3 Fatty Acids (FISH OIL) 1000 MG CAPS, Take 2 capsules by mouth daily. , Disp: , Rfl:  .  Probiotic Product (PROBIOTIC DAILY PO), Take by mouth daily. , Disp: , Rfl:  .  rosuvastatin (CRESTOR) 20 MG tablet, TAKE 1 TABLET BY MOUTH EVERY NIGHT AT BEDTIME, Disp: 90 tablet, Rfl: 1 .  telmisartan (MICARDIS) 80 MG tablet, TAKE 1 TABLET BY MOUTH ONCE A DAY, Disp: 90 tablet, Rfl: 1 .  Turmeric 400 MG CAPS, Take 2 capsules by mouth daily., Disp: , Rfl:   Allergies  Allergen Reactions  . Nickel   . Penicillins     I personally reviewed active problem list, medication list, allergies, family history, social history, health maintenance, notes from last encounter, lab results, imaging with the patient/caregiver today.  Review of Systems  Constitutional: Negative.    HENT: Negative.   Eyes: Negative.   Respiratory: Negative.   Cardiovascular: Negative.   Gastrointestinal: Negative.   Endocrine: Negative.   Genitourinary: Negative.   Musculoskeletal: Negative.   Skin: Negative.   Allergic/Immunologic: Negative.   Neurological: Negative.   Hematological: Negative.   Psychiatric/Behavioral: Negative.   All other systems reviewed and  are negative.    Objective:    Vitals:   08/30/19 0931  BP: 136/84  Pulse: 67  Resp: 14  Temp: 98.3 F (36.8 C)  SpO2: 96%  Weight: 183 lb 12.8 oz (83.4 kg)  Height: 5\' 2"  (1.575 m)    Body mass index is 33.62 kg/m.  Physical Exam Vitals signs and nursing note reviewed.  Constitutional:      General: She is not in acute distress.    Appearance: Normal appearance. She is well-developed. She is obese. She is not ill-appearing, toxic-appearing or diaphoretic.     Interventions: Face mask in place.  HENT:     Head: Normocephalic and atraumatic.     Right Ear: External ear normal.     Left Ear: External ear normal.  Eyes:     General: Lids are normal. No scleral icterus.       Right eye: No discharge.        Left eye: No discharge.     Conjunctiva/sclera: Conjunctivae normal.  Neck:     Musculoskeletal: Normal range of motion and neck supple.     Trachea: Phonation normal. No tracheal deviation.  Cardiovascular:     Rate and Rhythm: Normal rate and regular rhythm.     Pulses: Normal pulses.          Radial pulses are 2+ on the right side and 2+ on the left side.       Posterior tibial pulses are 2+ on the right side and 2+ on the left side.     Heart sounds: Normal heart sounds. No murmur. No friction rub. No gallop.   Pulmonary:     Effort: Pulmonary effort is normal. No respiratory distress.     Breath sounds: Normal breath sounds. No stridor. No wheezing, rhonchi or rales.  Chest:     Chest wall: No tenderness.  Abdominal:     General: Bowel sounds are normal. There is no distension.      Palpations: Abdomen is soft.     Tenderness: There is no abdominal tenderness. There is no guarding or rebound.  Musculoskeletal: Normal range of motion.        General: No deformity.     Right lower leg: No edema.     Left lower leg: No edema.  Lymphadenopathy:     Cervical: No cervical adenopathy.  Skin:    General: Skin is warm and dry.     Capillary Refill: Capillary refill takes less than 2 seconds.     Coloration: Skin is not jaundiced or pale.     Findings: No rash.  Neurological:     Mental Status: She is alert and oriented to person, place, and time.     Motor: No abnormal muscle tone.     Gait: Gait normal.  Psychiatric:        Mood and Affect: Mood normal.        Speech: Speech normal.        Behavior: Behavior normal.      No results found for this or any previous visit (from the past 2160 hour(s)).   PHQ2/9: Depression screen [facility] 2/9 08/30/2019 12/24/2018 08/07/2018 02/04/2018 12/19/2017  Decreased Interest 0 0 0 0 0  Down, Depressed, Hopeless 0 0 0 0 0  PHQ - 2 Score 0 0 0 0 0  Altered sleeping 0 - - - -  Tired, decreased energy 0 - - - -  Change in appetite 0 - - - -  Feeling bad or failure about yourself  0 - - - -  Trouble concentrating 0 - - - -  Moving slowly or fidgety/restless 0 - - - -  Suicidal thoughts 0 - - - -  PHQ-9 Score 0 - - - -  Difficult doing work/chores Not difficult at all - - - -    phq 9 is negative Reviewed today  Fall Risk: Fall Risk  08/30/2019 02/26/2019 12/24/2018 08/07/2018 02/04/2018  Falls in the past year? 0 0 0 No No  Comment - - - - -  Number falls in past yr: 0 - 0 - -  Injury with Fall? 0 - 0 - -  Risk for fall due to : - - - - -  Risk for fall due to: Comment - - - - -  Follow up - - Falls prevention discussed - -      Functional Status Survey: Is the patient deaf or have difficulty hearing?: No Does the patient have difficulty seeing, even when wearing glasses/contacts?: No Does the patient have difficulty  concentrating, remembering, or making decisions?: No Does the patient have difficulty walking or climbing stairs?: No Does the patient have difficulty dressing or bathing?: No Does the patient have difficulty doing errands alone such as visiting a doctor's office or shopping?: No    Assessment & Plan:    1. Benign hypertension Well-controlled today - COMPLETE METABOLIC PANEL WITH GFR - Lipid panel  2. Pure hypercholesterolemia Tolerating meds without any concerns or side effects, check labs today - COMPLETE METABOLIC PANEL WITH GFR - Lipid panel  3. Prediabetes Patient has been working on lifestyle exercise and has been losing weight recheck A1c - AIC - COMPLETE METABOLIC PANEL WITH GFR - Lipid panel  4. Obesity (BMI 30.0-34.9) Labs per above, weight down, commended on efforts and weight loss!  Encouraged to keep up her exercising and healthy diet choices  5. Encounter for medication monitoring - CBC with Differential/Platelet - COMPLETE METABOLIC PANEL WITH GFR - Lipid panel  6. Osteopenia of multiple sites Due for repeat bone density scan - last done > 2 years ago, Sept 2018, has been supplementing with vitamin D D and walking and exercising order dexa - DG Bone Density; Future  7. Encounter for screening mammogram for malignant neoplasm of breast Overdue for mammogram new order put in, patient will call [place] to schedule - MM 3D SCREEN BREAST BILATERAL; Future   Return in about 6 months (around 02/28/2020) for Routine follow-up.   Delsa Grana, PA-C 08/30/19 9:55 AM

## 2019-09-01 LAB — CBC WITH DIFFERENTIAL/PLATELET
Absolute Monocytes: 442 cells/uL (ref 200–950)
Basophils Absolute: 53 cells/uL (ref 0–200)
Basophils Relative: 0.8 %
Eosinophils Absolute: 132 cells/uL (ref 15–500)
Eosinophils Relative: 2 %
HCT: 36.5 % (ref 35.0–45.0)
Hemoglobin: 12.3 g/dL (ref 11.7–15.5)
Lymphs Abs: 1432 cells/uL (ref 850–3900)
MCH: 28.9 pg (ref 27.0–33.0)
MCHC: 33.7 g/dL (ref 32.0–36.0)
MCV: 85.7 fL (ref 80.0–100.0)
MPV: 11.1 fL (ref 7.5–12.5)
Monocytes Relative: 6.7 %
Neutro Abs: 4541 cells/uL (ref 1500–7800)
Neutrophils Relative %: 68.8 %
Platelets: 195 10*3/uL (ref 140–400)
RBC: 4.26 10*6/uL (ref 3.80–5.10)
RDW: 12.9 % (ref 11.0–15.0)
Total Lymphocyte: 21.7 %
WBC: 6.6 10*3/uL (ref 3.8–10.8)

## 2019-09-01 LAB — TEST AUTHORIZATION

## 2019-09-01 LAB — LIPID PANEL
Cholesterol: 146 mg/dL (ref <200)
HDL: 35 mg/dL — ABNORMAL LOW (ref >=50)
LDL Cholesterol (Calc): 84 mg/dL (calc)
Non-HDL Cholesterol (Calc): 111 mg/dL (calc) (ref <130)
Total CHOL/HDL Ratio: 4.2 (calc) (ref <5.0)
Triglycerides: 174 mg/dL — ABNORMAL HIGH (ref <150)

## 2019-09-01 LAB — COMPLETE METABOLIC PANEL WITH GFR
AG Ratio: 1.8 (calc) (ref 1.0–2.5)
ALT: 10 U/L (ref 6–29)
AST: 13 U/L (ref 10–35)
Albumin: 4.3 g/dL (ref 3.6–5.1)
Alkaline phosphatase (APISO): 73 U/L (ref 37–153)
BUN: 19 mg/dL (ref 7–25)
CO2: 29 mmol/L (ref 20–32)
Calcium: 9.2 mg/dL (ref 8.6–10.4)
Chloride: 105 mmol/L (ref 98–110)
Creat: 0.86 mg/dL (ref 0.60–0.93)
GFR, Est African American: 76 mL/min/{1.73_m2} (ref >=60)
GFR, Est Non African American: 66 mL/min/{1.73_m2} (ref >=60)
Globulin: 2.4 g/dL (calc) (ref 1.9–3.7)
Glucose, Bld: 108 mg/dL — ABNORMAL HIGH (ref 65–99)
Potassium: 4.6 mmol/L (ref 3.5–5.3)
Sodium: 141 mmol/L (ref 135–146)
Total Bilirubin: 0.9 mg/dL (ref 0.2–1.2)
Total Protein: 6.7 g/dL (ref 6.1–8.1)

## 2019-09-01 LAB — HEMOGLOBIN A1C W/OUT EAG: Hgb A1c MFr Bld: 5.6 % of total Hgb (ref <5.7)

## 2019-10-20 ENCOUNTER — Other Ambulatory Visit: Payer: Self-pay | Admitting: Family Medicine

## 2019-10-20 DIAGNOSIS — E785 Hyperlipidemia, unspecified: Secondary | ICD-10-CM

## 2019-10-20 DIAGNOSIS — I10 Essential (primary) hypertension: Secondary | ICD-10-CM

## 2019-12-28 ENCOUNTER — Ambulatory Visit (INDEPENDENT_AMBULATORY_CARE_PROVIDER_SITE_OTHER): Payer: Medicare Other

## 2019-12-28 VITALS — BP 124/63 | HR 77 | Temp 98.8°F | Ht 62.0 in | Wt 187.4 lb

## 2019-12-28 DIAGNOSIS — Z0000 Encounter for general adult medical examination without abnormal findings: Secondary | ICD-10-CM

## 2019-12-28 NOTE — Progress Notes (Signed)
Subjective:   Susan Summers is a 79 y.o. female who presents for Medicare Annual (Subsequent) preventive examination.  Virtual Visit via Telephone Note  I connected with Susan Summers on 12/28/19 at  8:40 AM EST by telephone and verified that I am speaking with the correct person using two identifiers.  Medicare Annual Wellness visit completed telephonically due to Covid-19 pandemic.   Location: Patient: home Provider: office   I discussed the limitations, risks, security and privacy concerns of performing an evaluation and management service by telephone and the availability of in person appointments. The patient expressed understanding and agreed to proceed.  Some vital signs may be absent or patient reported.   Susan Marker, LPN    Review of Systems:         Objective:     Vitals: BP 124/63   Pulse 77   Temp 98.8 F (37.1 C)   Ht 5\' 2"  (1.575 m)   Wt 187 lb 6.4 oz (85 kg)   BMI 34.28 kg/m   Body mass index is 34.28 kg/m.  Advanced Directives 12/24/2018 12/19/2017 08/07/2017 03/12/2017 02/10/2017 12/16/2016 11/13/2016  Does Patient Have a Medical Advance Directive? No Yes Yes Yes Yes Yes Yes  Type of Advance Directive - [place];Living will - Living will Living will Living will Living will  Does patient want to make changes to medical advance directive? - - - - - - -  Copy of [place] in Chart? - No - copy requested - - - - -  Would patient like information on creating a medical advance directive? No - Patient declined - - - - - -    Tobacco Social History   Tobacco Use  Smoking Status Never Smoker  Smokeless Tobacco Never Used  Tobacco Comment   smoking cessation materials not required     Counseling given: Not Answered Comment: smoking cessation materials not required   Clinical Intake:  Pre-visit preparation completed: Yes  Pain : No/denies pain     BMI - recorded: 34.28 Nutritional Status: BMI > 30   Obese Nutritional Risks: None Diabetes: No  How often do you need to have someone help you when you read instructions, pamphlets, or other written materials from your doctor or pharmacy?: 1 - Never  Interpreter Needed?: No  Information entered by :: Susan Marker LPN  Past Medical History:  Diagnosis Date  . Hyperlipidemia   . Hypertension   . Osteopenia   . Prediabetes    Past Surgical History:  Procedure Laterality Date  . ABDOMINAL HYSTERECTOMY    . APPENDECTOMY    . BREAST BIOPSY Left 10/20/2018   [place] bx, path pending  . HERNIA REPAIR    . oopherrectomy  2012  . UMBILICAL HERNIA REPAIR  2012   Family History  Problem Relation Age of Onset  . Hypertension Mother   . Stroke Mother   . Rheum arthritis Mother   . Diabetes Father   . Cancer Sister        Ovarian  . Diabetes Sister   . Hypertension Brother   . Melanoma Daughter   . Hypertension Son   . Hyperlipidemia Son   . Stroke Brother   . Cancer Brother        Prostate  . Other Brother        bowel obstruction  . Hyperlipidemia Sister   . Cancer Sister        skin- SCC removed   Social  History   Socioeconomic History  . Marital status: Widowed    Spouse name: Susan Summers  . Number of children: 2  . Years of education: Not on file  . Highest education level: 12th grade  Occupational History  . Occupation: Retired  Tobacco Use  . Smoking status: Never Smoker  . Smokeless tobacco: Never Used  . Tobacco comment: smoking cessation materials not required  Substance and Sexual Activity  . Alcohol use: No    Alcohol/week: 0.0 standard drinks  . Drug use: No  . Sexual activity: Not Currently  Other Topics Concern  . Not on file  Social History Narrative  . Not on file   Social Determinants of Health   Financial Resource Strain: Low Risk   . Difficulty of Paying Living Expenses: Not hard at all  Food Insecurity: No Food Insecurity  . Worried About Charity fundraiser in the Last Year: Never true  . Ran  Out of Food in the Last Year: Never true  Transportation Needs: No Transportation Needs  . Lack of Transportation (Medical): No  . Lack of Transportation (Non-Medical): No  Physical Activity: Sufficiently Active  . Days of Exercise per Week: 5 days  . Minutes of Exercise per Session: 40 min  Stress: No Stress Concern Present  . Feeling of Stress : Not at all  Social Connections: Slightly Isolated  . Frequency of Communication with Friends and Family: More than three times a week  . Frequency of Social Gatherings with Friends and Family: Three times a week  . Attends Religious Services: More than 4 times per year  . Active Member of Clubs or Organizations: Yes  . Attends Archivist Meetings: More than 4 times per year  . Marital Status: Widowed    Outpatient Encounter Medications as of 12/28/2019  Medication Sig  . Cholecalciferol (VITAMIN D PO) Take 4,000 Units by mouth daily.   Marland Kitchen ELDERBERRY PO Take 1,250 mg by mouth daily.   . Garlic 123XX123 MG TABS Take by mouth daily.  . hydrochlorothiazide (MICROZIDE) 12.5 MG capsule TAKE 1 CAPSULE BY MOUTH ONCE DAILY  . Misc Natural Products (TART CHERRY ADVANCED PO) Take 1 capsule by mouth daily.  . Omega-3 Fatty Acids (FISH OIL) 1000 MG CAPS Take 2 capsules by mouth daily.   . Probiotic Product (PROBIOTIC DAILY PO) Take by mouth daily.   . rosuvastatin (CRESTOR) 20 MG tablet TAKE 1 TABLET BY MOUTH AT BEDTIME  . telmisartan (MICARDIS) 80 MG tablet TAKE 1 TABLET BY MOUTH ONCE DAILY  . Turmeric 400 MG CAPS Take 2 capsules by mouth daily.   No facility-administered encounter medications on file as of 12/28/2019.    Activities of Daily Living In your present state of health, do you have any difficulty performing the following activities: 08/30/2019 02/26/2019  Hearing? N N  Vision? N N  Difficulty concentrating or making decisions? N N  Walking or climbing stairs? N N  Dressing or bathing? N N  Doing errands, shopping? N N  Some recent  data might be hidden    Patient Care Team: Lada, Satira Anis, MD as PCP - General (Family Medicine) Karren Burly Deirdre Peer, MD as Referring Physician (Ophthalmology)    Assessment:   This is a routine wellness examination for Susan Summers.  Exercise Activities and Dietary recommendations    Goals    . DIET - INCREASE WATER INTAKE     Recommend to drink at least 6-8 8oz glasses of water per day.    Marland Kitchen  Exercise      Starting 12/16/16, I will start exercising 3 days week for 30 minutes.        Fall Risk Fall Risk  08/30/2019 02/26/2019 12/24/2018 08/07/2018 02/04/2018  Falls in the past year? 0 0 0 No No  Comment - - - - -  Number falls in past yr: 0 - 0 - -  Injury with Fall? 0 - 0 - -  Risk for fall due to : - - - - -  Risk for fall due to: Comment - - - - -  Follow up - - Falls prevention discussed - -   FALL RISK PREVENTION PERTAINING TO THE HOME:  Any stairs in or around the home? Yes  If so, do they handrails? Yes   Home free of loose throw rugs in walkways, pet beds, electrical cords, etc? Yes  Adequate lighting in your home to reduce risk of falls? Yes   ASSISTIVE DEVICES UTILIZED TO PREVENT FALLS:  Life alert? No  Use of a cane, walker or w/c? No  Grab bars in the bathroom? Yes  Shower chair or bench in shower? No  Elevated toilet seat or a handicapped toilet? Yes   DME ORDERS:  DME order needed?  No   TIMED UP AND GO:  Was the test performed? No . Telephonic visit.   Education: Fall risk prevention has been discussed.  Intervention(s) required? No   Depression Screen PHQ 2/9 Scores 12/28/2019 08/30/2019 12/24/2018 08/07/2018  PHQ - 2 Score 0 0 0 0  PHQ- 9 Score - 0 - -     Cognitive Function     6CIT Screen 12/24/2018 12/19/2017 12/16/2016  What Year? 0 points 0 points 0 points  What month? 0 points 0 points 0 points  What time? 0 points 0 points 0 points  Count back from 20 0 points 0 points 0 points  Months in reverse 0 points 0 points 0 points  Repeat  phrase 0 points 2 points 0 points  Total Score 0 2 0    Immunization History  Administered Date(s) Administered  . Fluad Quad(high Dose 65+) 07/21/2019  . Influenza, High Dose Seasonal PF 08/07/2017, 08/07/2018  . Influenza-Unspecified 07/12/2014, 08/02/2015, 08/12/2016  . Pneumococcal Conjugate-13 07/26/2014  . Pneumococcal Polysaccharide-23 12/16/2016  . Td 11/12/2003  . Tdap 08/12/2016    Qualifies for Shingles Vaccine? Yes . Due for Shingrix. Education has been provided regarding the importance of this vaccine. Pt has been advised to call insurance company to determine out of pocket expense. Advised may also receive vaccine at local pharmacy or Health Dept. Verbalized acceptance and understanding.  Tdap: Up to date  Flu Vaccine: Up to date  Pneumococcal Vaccine: Up to date    Screening Tests Health Maintenance  Topic Date Due  . MAMMOGRAM  09/30/2019  . TETANUS/TDAP  08/12/2026  . INFLUENZA VACCINE  Completed  . DEXA SCAN  Completed  . PNA vac Low Risk Adult  Completed    Cancer Screenings:  Colorectal Screening: Completed 2012.  No longer required.   Mammogram: Completed 09/29/18. Repeat every year. Ordered 08/30/19. Pt provided with contact information and advised to call to schedule appt.   Bone Density: Completed 07/17/17. Results reflect NORMAL. Repeat every 2 years. Ordered 08/30/19. Pt provided with contact information and advised to call to schedule appt.   Lung Cancer Screening: (Low Dose CT Chest recommended if Age 68-80 years, 30 pack-year currently smoking OR have quit w/in 15years.) does not qualify.  Additional Screening:  Hepatitis C Screening: no longer required  Vision Screening: Recommended annual ophthalmology exams for early detection of glaucoma and other disorders of the eye. Is the patient up to date with their annual eye exam?  Yes  Who is the provider or what is the name of the office in which the pt attends annual eye exams? [place] Screening: Recommended annual dental exams for proper oral hygiene  Community Resource Referral:  CRR required this visit?  No      Plan:     I have personally reviewed and addressed the Medicare Annual Wellness questionnaire and have noted the following in the patient's chart:  A. Medical and social history B. Use of alcohol, tobacco or illicit drugs  C. Current medications and supplements D. Functional ability and status E.  Nutritional status F.  Physical activity G. Advance directives H. List of other physicians I.  Hospitalizations, surgeries, and ER visits in previous 12 months J.  [place] such as hearing and vision if needed, cognitive and depression L. Referrals and appointments   In addition, I have reviewed and discussed with patient certain preventive protocols, quality metrics, and best practice recommendations. A written personalized care plan for preventive services as well as general preventive health recommendations were provided to patient.   Signed,  Susan Marker, LPN Nurse Health Advisor   Nurse Notes: none

## 2019-12-28 NOTE — Patient Instructions (Signed)
[name] , Thank you for taking time to come for your Medicare Wellness Visit. I appreciate your ongoing commitment to your health goals. Please review the following plan we discussed and let me know if I can assist you in the future.   Screening recommendations/referrals: Colonoscopy: done 2012 Mammogram: done 09/29/18 Bone Density: 07/17/17 Recommended yearly ophthalmology/optometry visit for glaucoma screening and checkup Recommended yearly dental visit for hygiene and checkup  Vaccinations: Influenza vaccine: 07/21/19 Pneumococcal vaccine: 12/16/16 Tdap vaccine: 08/12/16 Shingles vaccine: Shingrix discussed. Please contact your pharmacy for coverage information.   Advanced directives: Please bring a copy of your health care power of attorney and living will to the office at your convenience once you have completed those documents.   Conditions/risks identified: Keep up the great work!  Next appointment: Please follow up in one year for your Medicare Annual Wellness visit.     Preventive Care 77 Years and Older, Female Preventive care refers to lifestyle choices and visits with your health care provider that can promote health and wellness. What does preventive care include?  A yearly physical exam. This is also called an annual well check.  Dental exams once or twice a year.  Routine eye exams. Ask your health care provider how often you should have your eyes checked.  Personal lifestyle choices, including:  Daily care of your teeth and gums.  Regular physical activity.  Eating a healthy diet.  Avoiding tobacco and drug use.  Limiting alcohol use.  Practicing safe sex.  Taking low-dose aspirin every day.  Taking vitamin and mineral supplements as recommended by your health care provider. What happens during an annual well check? The services and screenings done by your health care provider during your annual well check will depend on your age, overall health,  lifestyle risk factors, and family history of disease. Counseling  Your health care provider may ask you questions about your:  Alcohol use.  Tobacco use.  Drug use.  Emotional well-being.  Home and relationship well-being.  Sexual activity.  Eating habits.  History of falls.  Memory and ability to understand (cognition).  Work and work Statistician.  Reproductive health. Screening  You may have the following tests or measurements:  Height, weight, and BMI.  Blood pressure.  Lipid and cholesterol levels. These may be checked every 5 years, or more frequently if you are over 60 years old.  Skin check.  Lung cancer screening. You may have this screening every year starting at age 75 if you have a 30-pack-year history of smoking and currently smoke or have quit within the past 15 years.  Fecal occult blood test (FOBT) of the stool. You may have this test every year starting at age 74.  Flexible sigmoidoscopy or colonoscopy. You may have a sigmoidoscopy every 5 years or a colonoscopy every 10 years starting at age 39.  Hepatitis C blood test.  Hepatitis B blood test.  Sexually transmitted disease (STD) testing.  Diabetes screening. This is done by checking your blood sugar (glucose) after you have not eaten for a while (fasting). You may have this done every 1-3 years.  Bone density scan. This is done to screen for osteoporosis. You may have this done starting at age 47.  Mammogram. This may be done every 1-2 years. Talk to your health care provider about how often you should have regular mammograms. Talk with your health care provider about your test results, treatment options, and if necessary, the need for more tests. Vaccines  Your  health care provider may recommend certain vaccines, such as:  Influenza vaccine. This is recommended every year.  Tetanus, diphtheria, and acellular pertussis (Tdap, Td) vaccine. You may need a Td booster every 10 years.  Zoster  vaccine. You may need this after age 62.  Pneumococcal 13-valent conjugate (PCV13) vaccine. One dose is recommended after age 56.  Pneumococcal polysaccharide (PPSV23) vaccine. One dose is recommended after age 68. Talk to your health care provider about which screenings and vaccines you need and how often you need them. This information is not intended to replace advice given to you by your health care provider. Make sure you discuss any questions you have with your health care provider. Document Released: 11/24/2015 Document Revised: 07/17/2016 Document Reviewed: 08/29/2015 Elsevier Interactive Patient Education  2017 [place] Prevention in the Home Falls can cause injuries. They can happen to people of all ages. There are many things you can do to make your home safe and to help prevent falls. What can I do on the outside of my home?  Regularly fix the edges of walkways and driveways and fix any cracks.  Remove anything that might make you trip as you walk through a door, such as a raised step or threshold.  Trim any bushes or trees on the path to your home.  Use bright outdoor lighting.  Clear any walking paths of anything that might make someone trip, such as rocks or tools.  Regularly check to see if handrails are loose or broken. Make sure that both sides of any steps have handrails.  Any raised decks and porches should have guardrails on the edges.  Have any leaves, snow, or ice cleared regularly.  Use sand or salt on walking paths during winter.  Clean up any spills in your garage right away. This includes oil or grease spills. What can I do in the bathroom?  Use night lights.  Install grab bars by the toilet and in the tub and shower. Do not use towel bars as grab bars.  Use non-skid mats or decals in the tub or shower.  If you need to sit down in the shower, use a plastic, non-slip stool.  Keep the floor dry. Clean up any water that spills on the  floor as soon as it happens.  Remove soap buildup in the tub or shower regularly.  Attach bath mats securely with double-sided non-slip rug tape.  Do not have throw rugs and other things on the floor that can make you trip. What can I do in the bedroom?  Use night lights.  Make sure that you have a light by your bed that is easy to reach.  Do not use any sheets or blankets that are too big for your bed. They should not hang down onto the floor.  Have a firm chair that has side arms. You can use this for support while you get dressed.  Do not have throw rugs and other things on the floor that can make you trip. What can I do in the kitchen?  Clean up any spills right away.  Avoid walking on wet floors.  Keep items that you use a lot in easy-to-reach places.  If you need to reach something above you, use a strong step stool that has a grab bar.  Keep electrical cords out of the way.  Do not use floor polish or wax that makes floors slippery. If you must use wax, use non-skid floor wax.  Do not  have throw rugs and other things on the floor that can make you trip. What can I do with my stairs?  Do not leave any items on the stairs.  Make sure that there are handrails on both sides of the stairs and use them. Fix handrails that are broken or loose. Make sure that handrails are as long as the stairways.  Check any carpeting to make sure that it is firmly attached to the stairs. Fix any carpet that is loose or worn.  Avoid having throw rugs at the top or bottom of the stairs. If you do have throw rugs, attach them to the floor with carpet tape.  Make sure that you have a light switch at the top of the stairs and the bottom of the stairs. If you do not have them, ask someone to add them for you. What else can I do to help prevent falls?  Wear shoes that:  Do not have high heels.  Have rubber bottoms.  Are comfortable and fit you well.  Are closed at the toe. Do not wear  sandals.  If you use a stepladder:  Make sure that it is fully opened. Do not climb a closed stepladder.  Make sure that both sides of the stepladder are locked into place.  Ask someone to hold it for you, if possible.  Clearly mark and make sure that you can see:  Any grab bars or handrails.  First and last steps.  Where the edge of each step is.  Use tools that help you move around (mobility aids) if they are needed. These include:  Canes.  Walkers.  Scooters.  Crutches.  Turn on the lights when you go into a dark area. Replace any light bulbs as soon as they burn out.  Set up your furniture so you have a clear path. Avoid moving your furniture around.  If any of your floors are uneven, fix them.  If there are any pets around you, be aware of where they are.  Review your medicines with your doctor. Some medicines can make you feel dizzy. This can increase your chance of falling. Ask your doctor what other things that you can do to help prevent falls. This information is not intended to replace advice given to you by your health care provider. Make sure you discuss any questions you have with your health care provider. Document Released: 08/24/2009 Document Revised: 04/04/2016 Document Reviewed: 12/02/2014 Elsevier Interactive Patient Education  2017 [employer].

## 2020-02-28 ENCOUNTER — Encounter: Payer: Self-pay | Admitting: Family Medicine

## 2020-02-28 ENCOUNTER — Ambulatory Visit (INDEPENDENT_AMBULATORY_CARE_PROVIDER_SITE_OTHER): Payer: Medicare Other | Admitting: Family Medicine

## 2020-02-28 ENCOUNTER — Other Ambulatory Visit: Payer: Self-pay

## 2020-02-28 VITALS — BP 132/78 | HR 67 | Temp 97.9°F | Resp 14 | Ht 62.0 in | Wt 186.3 lb

## 2020-02-28 DIAGNOSIS — R252 Cramp and spasm: Secondary | ICD-10-CM

## 2020-02-28 DIAGNOSIS — I10 Essential (primary) hypertension: Secondary | ICD-10-CM

## 2020-02-28 DIAGNOSIS — R7303 Prediabetes: Secondary | ICD-10-CM | POA: Diagnosis not present

## 2020-02-28 DIAGNOSIS — E66811 Obesity, class 1: Secondary | ICD-10-CM

## 2020-02-28 DIAGNOSIS — M8580 Other specified disorders of bone density and structure, unspecified site: Secondary | ICD-10-CM | POA: Diagnosis not present

## 2020-02-28 DIAGNOSIS — E669 Obesity, unspecified: Secondary | ICD-10-CM

## 2020-02-28 DIAGNOSIS — E782 Mixed hyperlipidemia: Secondary | ICD-10-CM | POA: Diagnosis not present

## 2020-02-28 DIAGNOSIS — E785 Hyperlipidemia, unspecified: Secondary | ICD-10-CM

## 2020-02-28 DIAGNOSIS — Z5181 Encounter for therapeutic drug level monitoring: Secondary | ICD-10-CM

## 2020-02-28 LAB — COMPLETE METABOLIC PANEL WITH GFR
AG Ratio: 2.2 (calc) (ref 1.0–2.5)
ALT: 12 U/L (ref 6–29)
AST: 14 U/L (ref 10–35)
Albumin: 4.6 g/dL (ref 3.6–5.1)
Alkaline phosphatase (APISO): 69 U/L (ref 37–153)
BUN: 16 mg/dL (ref 7–25)
CO2: 30 mmol/L (ref 20–32)
Calcium: 9.5 mg/dL (ref 8.6–10.4)
Chloride: 104 mmol/L (ref 98–110)
Creat: 0.78 mg/dL (ref 0.60–0.93)
GFR, Est African American: 86 mL/min/{1.73_m2} (ref >=60)
GFR, Est Non African American: 74 mL/min/{1.73_m2} (ref >=60)
Globulin: 2.1 g/dL (calc) (ref 1.9–3.7)
Glucose, Bld: 104 mg/dL — ABNORMAL HIGH (ref 65–99)
Potassium: 4.5 mmol/L (ref 3.5–5.3)
Sodium: 141 mmol/L (ref 135–146)
Total Bilirubin: 0.7 mg/dL (ref 0.2–1.2)
Total Protein: 6.7 g/dL (ref 6.1–8.1)

## 2020-02-28 LAB — LIPID PANEL
Cholesterol: 135 mg/dL (ref <200)
HDL: 37 mg/dL — ABNORMAL LOW (ref >=50)
LDL Cholesterol (Calc): 75 mg/dL (calc)
Non-HDL Cholesterol (Calc): 98 mg/dL (calc) (ref <130)
Total CHOL/HDL Ratio: 3.6 (calc) (ref <5.0)
Triglycerides: 147 mg/dL (ref <150)

## 2020-02-28 MED ORDER — ROSUVASTATIN CALCIUM 20 MG PO TABS: 20.0000 mg | ORAL_TABLET | Freq: Every day | ORAL | 3 refills | Status: DC

## 2020-02-28 MED ORDER — HYDROCHLOROTHIAZIDE 12.5 MG PO CAPS: 12.5000 mg | ORAL_CAPSULE | Freq: Every day | ORAL | 3 refills | Status: DC

## 2020-02-28 MED ORDER — TELMISARTAN 80 MG PO TABS: 80.0000 mg | ORAL_TABLET | Freq: Every day | ORAL | 3 refills | Status: DC

## 2020-02-28 NOTE — Progress Notes (Signed)
Name: Susan Summers   MRN: [id number]    DOB: 08/07/1941   Date:02/28/2020       Progress Note  Chief Complaint  Patient presents with  . Follow-up  . Hypertension  . Hyperlipidemia     Subjective:   Susan Summers is a 79 y.o. female, presents to clinic for routine follow up on the conditions listed above.  Hypertension:  Currently managed on HCTZ 12.5 daily, telisartan 80 mg  Checks BP at home, runs low, no lightheaded episodes or dizziness - 120/50-60 - when she is relaxed at home her BP is lower  Pt reports  med compliance and denies any SE.  No lightheadedness, hypotension, syncope. Blood pressure today is well controlled. BP Readings from Last 3 Encounters:  02/28/20 132/78  12/28/19 124/63  08/30/19 136/84   Pt denies CP, SOB, exertional sx, LE edema, palpitation, Ha's, visual disturbances Dietary efforts for BP?  Overall healthy diet and does not add much salt Some muscle aches and spasms   Hyperlipidemia: Current Medication Regimen:   Last Lipids: Lab Results  Component Value Date   CHOL 146 08/30/2019   HDL 35 (L) 08/30/2019   LDLCALC 84 08/30/2019   TRIG 174 (H) 08/30/2019   CHOLHDL 4.2 08/30/2019   - Current Diet: Her to work on improving her diet and walking again - Denies: Chest pain, shortness of breath, myalgias. - Documented aortic atherosclerosis? No - Risk factors for atherosclerosis: hypercholesterolemia and hypertension  Can't take calcium hurts her stomach On vit D supplement she is not sure what dose she is taking She is walking daily, her weight hasn't changed much but clothes are looser Weight fairly stable over the past 6 months her BMI continues to be 34 Wt Readings from Last 5 Encounters:  02/28/20 186 lb 4.8 oz (84.5 kg)  12/28/19 187 lb 6.4 oz (85 kg)  08/30/19 183 lb 12.8 oz (83.4 kg)  02/26/19 189 lb (85.7 kg)  12/24/18 189 lb 9.6 oz (86 kg)   BMI Readings from Last 5 Encounters:  02/28/20 34.07 kg/m  12/28/19 34.28 kg/m    08/30/19 33.62 kg/m  02/26/19 34.57 kg/m  12/24/18 34.68 kg/m     Still due for mammogram but it got pushed back due to COVID vaccine and she will call and schedule     Patient Active Problem List   Diagnosis Date Noted  . Obesity (BMI 30.0-34.9) 08/07/2018  . At risk for falling 02/04/2018  . Cramp in muscle 02/04/2018  . H/O malignant neoplasm of skin 02/04/2018  . Herpes zona 02/04/2018  . Need for prophylactic vaccination and inoculation against influenza 02/04/2018  . Osteopenia of the elderly 02/04/2018  . Post menopausal syndrome 02/04/2018  . Psoriasis 02/04/2018  . Chronic lower back pain 02/04/2018  . Prediabetes 11/13/2016  . Dermatitis 01/10/2016  . Swelling of both ankles 08/09/2015  . Benign hypertension 04/25/2015  . Hyperlipidemia 04/25/2015  . Plantar fasciitis 04/25/2015    Past Surgical History:  Procedure Laterality Date  . ABDOMINAL HYSTERECTOMY    . APPENDECTOMY    . BREAST BIOPSY Left 10/20/2018   [place] bx, path pending  . HERNIA REPAIR    . oopherrectomy  2012  . UMBILICAL HERNIA REPAIR  2012    Family History  Problem Relation Age of Onset  . Hypertension Mother   . Stroke Mother   . Rheum arthritis Mother   . Diabetes Father   . Cancer Sister  Ovarian  . Diabetes Sister   . Hypertension Brother   . Melanoma Daughter   . Hypertension Son   . Hyperlipidemia Son   . Stroke Brother   . Cancer Brother        Prostate  . Other Brother        bowel obstruction  . Hyperlipidemia Sister   . Cancer Sister        skin- SCC removed    Social History   Tobacco Use  . Smoking status: Never Smoker  . Smokeless tobacco: Never Used  . Tobacco comment: smoking cessation materials not required  Substance Use Topics  . Alcohol use: No    Alcohol/week: 0.0 standard drinks  . Drug use: No      Current Outpatient Medications:  .  Cholecalciferol (VITAMIN D PO), Take 4,000 Units by mouth daily. , Disp: , Rfl:  .  ELDERBERRY PO,  Take 1,250 mg by mouth daily. , Disp: , Rfl:  .  Garlic 123XX123 MG TABS, Take by mouth daily., Disp: , Rfl:  .  hydrochlorothiazide (MICROZIDE) 12.5 MG capsule, TAKE 1 CAPSULE BY MOUTH ONCE DAILY, Disp: 90 capsule, Rfl: 1 .  Misc Natural Products (TART CHERRY ADVANCED PO), Take 1 capsule by mouth daily., Disp: , Rfl:  .  Omega-3 Fatty Acids (FISH OIL) 1000 MG CAPS, Take 2 capsules by mouth daily. , Disp: , Rfl:  .  Probiotic Product (PROBIOTIC DAILY PO), Take by mouth daily. , Disp: , Rfl:  .  rosuvastatin (CRESTOR) 20 MG tablet, TAKE 1 TABLET BY MOUTH AT BEDTIME, Disp: 90 tablet, Rfl: 1 .  telmisartan (MICARDIS) 80 MG tablet, TAKE 1 TABLET BY MOUTH ONCE DAILY, Disp: 90 tablet, Rfl: 1 .  Turmeric 400 MG CAPS, Take 2 capsules by mouth daily., Disp: , Rfl:   Allergies  Allergen Reactions  . Nickel   . Penicillins     Chart Review Today: I personally reviewed active problem list, medication list, allergies, family history, social history, health maintenance, notes from last encounter, lab results, imaging with the patient/caregiver today.   Review of Systems  10 Systems reviewed and are negative for acute change except as noted in the HPI.   Objective:    Vitals:   02/28/20 0903  BP: 132/78  Pulse: 67  Resp: 14  Temp: 97.9 F (36.6 C)  SpO2: 99%  Weight: 186 lb 4.8 oz (84.5 kg)  Height: 5\' 2"  (1.575 m)    Body mass index is 34.07 kg/m.  Physical Exam Vitals and nursing note reviewed.  Constitutional:      General: She is not in acute distress.    Appearance: Normal appearance. She is well-developed. She is obese. She is not ill-appearing, toxic-appearing or diaphoretic.     Interventions: Face mask in place.  HENT:     Head: Normocephalic and atraumatic.     Right Ear: External ear normal.     Left Ear: External ear normal.  Eyes:     General: Lids are normal. No scleral icterus.       Right eye: No discharge.        Left eye: No discharge.     Conjunctiva/sclera:  Conjunctivae normal.  Neck:     Trachea: Phonation normal. No tracheal deviation.  Cardiovascular:     Rate and Rhythm: Normal rate and regular rhythm.     Pulses: Normal pulses.          Radial pulses are 2+ on the right side and  2+ on the left side.       Posterior tibial pulses are 2+ on the right side and 2+ on the left side.     Heart sounds: Normal heart sounds. No murmur. No friction rub. No gallop.   Pulmonary:     Effort: Pulmonary effort is normal. No respiratory distress.     Breath sounds: Normal breath sounds. No stridor. No wheezing, rhonchi or rales.  Chest:     Chest wall: No tenderness.  Abdominal:     General: Bowel sounds are normal. There is no distension.     Palpations: Abdomen is soft.     Tenderness: There is no abdominal tenderness. There is no guarding or rebound.  Musculoskeletal:        General: No deformity. Normal range of motion.     Cervical back: Normal range of motion and neck supple.     Right lower leg: No edema.     Left lower leg: No edema.  Lymphadenopathy:     Cervical: No cervical adenopathy.  Skin:    General: Skin is warm and dry.     Coloration: Skin is not jaundiced or pale.     Findings: No rash.  Neurological:     Mental Status: She is alert and oriented to person, place, and time.     Motor: No abnormal muscle tone.     Gait: Gait normal.  Psychiatric:        Mood and Affect: Mood normal.        Speech: Speech normal.        Behavior: Behavior normal.       PHQ2/9: Depression screen [facility] 2/9 02/28/2020 12/28/2019 08/30/2019 12/24/2018 08/07/2018  Decreased Interest 0 0 0 0 0  Down, Depressed, Hopeless 0 0 0 0 0  PHQ - 2 Score 0 0 0 0 0  Altered sleeping 0 - 0 - -  Tired, decreased energy 0 - 0 - -  Change in appetite 0 - 0 - -  Feeling bad or failure about yourself  0 - 0 - -  Trouble concentrating 0 - 0 - -  Moving slowly or fidgety/restless 0 - 0 - -  Suicidal thoughts 0 - 0 - -  PHQ-9 Score 0 - 0 - -  Difficult doing  work/chores Not difficult at all - Not difficult at all - -    phq 9 is negative, reviewed today  Fall Risk: Fall Risk  02/28/2020 12/28/2019 08/30/2019 02/26/2019 12/24/2018  Falls in the past year? 0 0 0 0 0  Comment - - - - -  Number falls in past yr: 0 0 0 - 0  Injury with Fall? 0 0 0 - 0  Risk for fall due to : - No Fall Risks - - -  Risk for fall due to: Comment - - - - -  Follow up - Falls prevention discussed - - Falls prevention discussed    Functional Status Survey: Is the patient deaf or have difficulty hearing?: No Does the patient have difficulty seeing, even when wearing glasses/contacts?: No Does the patient have difficulty concentrating, remembering, or making decisions?: No Does the patient have difficulty walking or climbing stairs?: No Does the patient have difficulty dressing or bathing?: No Does the patient have difficulty doing errands alone such as visiting a doctor's office or shopping?: No   Assessment & Plan:     ICD-10-CM   1. Benign hypertension  [id number] COMPLETE METABOLIC PANEL WITH GFR  Lipid panel    telmisartan (MICARDIS) 80 MG tablet    hydrochlorothiazide (MICROZIDE) 12.5 MG capsule   Well-controlled consider stopping HCTZ, patient states blood pressure is lower when monitoring at home, no symptoms of hypotension  2. Prediabetes  [id number] COMPLETE METABOLIC PANEL WITH GFR    Lipid panel   Recheck A1c with continued obesity encouraged to work on decreased carbs, sweets and fat intake and increase walking as able  3. Osteopenia of the elderly  [id number] COMPLETE METABOLIC PANEL WITH GFR   Unable to tolerate calcium but is on vitamin D supplement, reviewed osteoporosis prevention, vitamin D and calcium dosing and encouraged weightbearing activity  4. Mixed hyperlipidemia  [id number] COMPLETE METABOLIC PANEL WITH GFR    Lipid panel    rosuvastatin (CRESTOR) 20 MG tablet   Has previously been well controlled with statin she is tolerating medications and compliant  recheck labs today  5. Cramp in muscle  R25.2    Encouraged to increase fluids, drink electrolytes when sweating and hot outdoors, try calcium magnesium zinc combo supplement  6. Obesity (BMI 30.0-34.9)  E66.9    Encouraged to continue healthy diet improving healthy foods and limiting sweets and high-fat foods increase walking distance and time  7. Medication monitoring encounter  [id number] COMPLETE METABOLIC PANEL WITH GFR    Lipid panel    telmisartan (MICARDIS) 80 MG tablet    rosuvastatin (CRESTOR) 20 MG tablet    hydrochlorothiazide (MICROZIDE) 12.5 MG capsule   Health Maintenance  Topic Date Due  . DEXA SCAN  07/18/2019  . MAMMOGRAM  09/30/2019  . INFLUENZA VACCINE  06/11/2020  . TETANUS/TDAP  08/12/2026  . COVID-19 Vaccine  Completed  . PNA vac Low Risk Adult  Completed   Patient is due for breast cancer screening and follow-up bone density for postmenopausal estrogen deficiency also has osteopenia, both orders for DEXA and mammogram are entered and patient has been given the number to call and schedule.  Return for 6 months routine f/up.   Delsa Grana, PA-C 02/28/20 9:41 AM

## 2020-02-28 NOTE — Patient Instructions (Addendum)
Try a calcium-magnesium-zinc combo supplement and see if your leg symptoms improve    Preventing Osteoporosis, Adult Osteoporosis is a condition that causes the bones to lose density. This means that the bones become thinner, and the normal spaces in bone tissue become larger. Low bone density can make the bones weak and cause them to break more easily. Osteoporosis cannot always be prevented, but you can take steps to lower your risk of developing this condition. How can this condition affect me? If you develop osteoporosis, you will be more likely to break bones in your wrist, spine, or hip. Even a minor accident or injury can be enough to break weak bones. The bones will also be slower to heal. Osteoporosis can cause other problems as well, such as a stooped posture or trouble with movement. Osteoporosis can occur with aging. As you get older, you may lose bone tissue more quickly, or it may be replaced more slowly. Osteoporosis is more likely to develop if you have poor nutrition or do not get enough calcium or vitamin D. Other lifestyle factors can also play a role. By eating a well-balanced diet and making lifestyle changes, you can help keep your bones strong and healthy, lowering your chances of developing osteoporosis. What can increase my risk? The following factors may make you more likely to develop osteoporosis:  Having a family history of the condition.  Having poor nutrition or not getting enough calcium or vitamin D.  Using certain medicines, such as steroid medicines or antiseizure medicines.  Being any of the following: ? 77 years of age or older. ? Female. ? A woman who has gone through menopause (is postmenopausal). ? White (Caucasian) or of Asian descent.  Smoking or having a history of smoking.  Not being physically active (being sedentary).  Having a small body frame. What actions can I take to prevent this?  Get enough calcium   Make sure you get enough  calcium every day. Calcium is the most important mineral for bone health. Most people can get enough calcium from their diet, but supplements may be recommended for people who are at risk for osteoporosis. Follow these guidelines: ? If you are age 99 or younger, aim to get 1,000 mg of calcium every day. ? If you are older than age 66, aim to get 1,200 mg of calcium every day.  Good sources of calcium include: ? Dairy products, such as low-fat or nonfat milk, cheese, and yogurt. ? Dark green leafy vegetables, such as bok choy and broccoli. ? Foods that have had calcium added to them (calcium-fortified foods), such as orange juice, cereal, bread, soy beverages, and tofu products. ? Nuts, such as almonds.  Check nutrition labels to see how much calcium is in a food or drink. Get enough vitamin D  Try to get enough vitamin D every day. Vitamin D is the most essential vitamin for bone health. It helps the body absorb calcium. Follow these guidelines for how much vitamin D to get from food: ? If you are age 25 or younger, aim to get at least 600 international units (IU) every day. Your health care provider may suggest more. ? If you are older than age 87, aim to get at least 800 international units every day. Your health care provider may suggest more.  Good sources of vitamin D in your diet include: ? Egg yolks. ? Oily fish, such as salmon, sardines, and tuna. ? Milk and cereal fortified with vitamin D.  Your  body also makes vitamin D when you are out in the sun. Exposing the bare skin on your face, arms, legs, or back to the sun for no more than 30 minutes a day, 2 times a week is more than enough. Beyond that, make sure you use sunblock to protect your skin from sunburn, which increases your risk for skin cancer. Exercise  Stay active and get exercise every day.  Ask your health care provider what types of exercise are best for you. Weight-bearing and strength-building activities are  important for building and maintaining healthy bones. Some examples of these types of activities include: ? Walking and hiking. ? Jogging and running. ? Dancing. ? Gym exercises. ? Lifting weights. ? Tennis and racquetball. ? Climbing stairs. ? Aerobics. Make other lifestyle changes  Do not use any products that contain nicotine or tobacco, such as cigarettes, e-cigarettes, and chewing tobacco. If you need help quitting, ask your health care provider.  Lose weight if you are overweight.  If you drink alcohol: ? Limit how much you use to:  0-1 drink a day for nonpregnant women.  0-2 drinks a day for men. ? Be aware of how much alcohol is in your drink. In the [place] one drink equals one 12 oz bottle of beer (355 mL), one 5 oz glass of wine (148 mL), or one 1 oz glass of hard liquor (44 mL). Where to find support If you need help making changes to prevent osteoporosis, talk with your health care provider. You can ask for a referral to a diet and nutrition specialist (dietitian) and a physical therapist. Where to find more information Learn more about osteoporosis from:  NIH Osteoporosis and Related [place]: www.bones.SouthExposed.es  [place] Office on [employer] Health: [place]Signs.tn  [place]: EquipmentWeekly.com.ee Summary  Osteoporosis is a condition that causes weak bones that are more likely to break.  Eat a healthy diet, making sure you get enough calcium and vitamin D, and stay active by getting regular exercise to help prevent osteoporosis.  Other ways to reduce your risk of osteoporosis include maintaining a healthy weight and avoiding alcohol and products that contain nicotine or tobacco. This information is not intended to replace advice given to you by your health care provider. Make sure you discuss any questions you have with your health care provider. Document Revised: 05/28/2019 Document Reviewed: 05/28/2019 Elsevier Patient  Education  [place].

## 2020-08-29 ENCOUNTER — Ambulatory Visit: Payer: Medicare Other | Admitting: Family Medicine

## 2020-09-12 ENCOUNTER — Other Ambulatory Visit: Payer: Self-pay

## 2020-09-12 ENCOUNTER — Encounter: Payer: Self-pay | Admitting: Family Medicine

## 2020-09-12 ENCOUNTER — Ambulatory Visit (INDEPENDENT_AMBULATORY_CARE_PROVIDER_SITE_OTHER): Payer: Medicare Other | Admitting: Family Medicine

## 2020-09-12 VITALS — BP 128/76 | HR 70 | Temp 97.8°F | Resp 16 | Ht 62.0 in | Wt 183.0 lb

## 2020-09-12 DIAGNOSIS — G8929 Other chronic pain: Secondary | ICD-10-CM

## 2020-09-12 DIAGNOSIS — I10 Essential (primary) hypertension: Secondary | ICD-10-CM | POA: Diagnosis not present

## 2020-09-12 DIAGNOSIS — Z1231 Encounter for screening mammogram for malignant neoplasm of breast: Secondary | ICD-10-CM

## 2020-09-12 DIAGNOSIS — E782 Mixed hyperlipidemia: Secondary | ICD-10-CM

## 2020-09-12 DIAGNOSIS — M545 Low back pain, unspecified: Secondary | ICD-10-CM

## 2020-09-12 DIAGNOSIS — M25531 Pain in right wrist: Secondary | ICD-10-CM

## 2020-09-12 DIAGNOSIS — R7303 Prediabetes: Secondary | ICD-10-CM

## 2020-09-12 DIAGNOSIS — E669 Obesity, unspecified: Secondary | ICD-10-CM

## 2020-09-12 DIAGNOSIS — Z5181 Encounter for therapeutic drug level monitoring: Secondary | ICD-10-CM

## 2020-09-12 DIAGNOSIS — Z780 Asymptomatic menopausal state: Secondary | ICD-10-CM

## 2020-09-12 DIAGNOSIS — Z1159 Encounter for screening for other viral diseases: Secondary | ICD-10-CM

## 2020-09-12 LAB — CBC WITH DIFFERENTIAL/PLATELET
Basophils Absolute: 42 cells/uL (ref 0–200)
Basophils Relative: 0.8 %
Hemoglobin: 12.5 g/dL (ref 11.7–15.5)
MCH: 28.7 pg (ref 27.0–33.0)
MPV: 11.4 fL (ref 7.5–12.5)
Platelets: 175 10*3/uL (ref 140–400)
Total Lymphocyte: 25.2 %

## 2020-09-12 NOTE — Progress Notes (Signed)
Name: Susan Summers   MRN: [id number]    DOB: 1941-09-13   Date:09/12/2020       Progress Note  Chief Complaint  Patient presents with  . Hypertension  . Hyperlipidemia     Subjective:   Susan Summers is a 79 y.o. female, presents to clinic for routine f/up  HTN - managed on HCTZ 12.5 and telmisartan 80 mg BP Readings from Last 3 Encounters:  09/12/20 128/76  02/28/20 132/78  12/28/19 124/63   She continue to monitor at home diastolic BP was low, but she is not sure if she's doing it right.  She denies CP, palpitations, near syncope, DOE, LE edema  HLD - managed with crestor 20 mg at bedtime, good med compliance, no claudication sx, was previously having leg cramps occasionally, no longer having, and she denies myalgias  Osteopenia - due for f/up dexa  Breast CA screening - mammogram previously ordered but pt had delayed due to COVID vaccine   She compliant of years of right wrist pain, she told multiple prior PCPs, Dr. Sanda Klein had her take tumeric and wear a splint, pt has done that for years and it is no better, she has pain to wrist and hand, paresthesias and numbness, she likes to croquet and often she cannot due what she wants to because of sx.  She is RHD   Current Outpatient Medications:  .  Cholecalciferol (VITAMIN D PO), Take 4,000 Units by mouth daily. , Disp: , Rfl:  .  ELDERBERRY PO, Take 1,250 mg by mouth daily. , Disp: , Rfl:  .  Garlic 962 MG TABS, Take by mouth daily., Disp: , Rfl:  .  hydrochlorothiazide (MICROZIDE) 12.5 MG capsule, Take 1 capsule (12.5 mg total) by mouth daily., Disp: 90 capsule, Rfl: 3 .  Misc Natural Products (TART CHERRY ADVANCED PO), Take 1 capsule by mouth daily., Disp: , Rfl:  .  Omega-3 Fatty Acids (FISH OIL) 1000 MG CAPS, Take 2 capsules by mouth daily. , Disp: , Rfl:  .  Probiotic Product (PROBIOTIC DAILY PO), Take by mouth daily. , Disp: , Rfl:  .  rosuvastatin (CRESTOR) 20 MG tablet, Take 1 tablet (20 mg total) by mouth at bedtime.,  Disp: 90 tablet, Rfl: 3 .  telmisartan (MICARDIS) 80 MG tablet, Take 1 tablet (80 mg total) by mouth daily., Disp: 90 tablet, Rfl: 3 .  Turmeric 400 MG CAPS, Take 2 capsules by mouth daily., Disp: , Rfl:   Patient Active Problem List   Diagnosis Date Noted  . Obesity (BMI 30.0-34.9) 08/07/2018  . Cramp in muscle 02/04/2018  . H/O malignant neoplasm of skin 02/04/2018  . Osteopenia of the elderly 02/04/2018  . Post menopausal syndrome 02/04/2018  . Chronic lower back pain 02/04/2018  . Prediabetes 11/13/2016  . Benign hypertension 04/25/2015  . Hyperlipidemia 04/25/2015  . Plantar fasciitis 04/25/2015    Past Surgical History:  Procedure Laterality Date  . ABDOMINAL HYSTERECTOMY    . APPENDECTOMY    . BREAST BIOPSY Left 10/20/2018   [place] bx, path pending  . HERNIA REPAIR    . oopherrectomy  2012  . UMBILICAL HERNIA REPAIR  2012    Family History  Problem Relation Age of Onset  . Hypertension Mother   . Stroke Mother   . Rheum arthritis Mother   . Diabetes Father   . Cancer Sister        Ovarian  . Diabetes Sister   . Hypertension Brother   . Melanoma  Daughter   . Hypertension Son   . Hyperlipidemia Son   . Stroke Brother   . Cancer Brother        Prostate  . Other Brother        bowel obstruction  . Hyperlipidemia Sister   . Cancer Sister        skin- SCC removed    Social History   Tobacco Use  . Smoking status: Never Smoker  . Smokeless tobacco: Never Used  . Tobacco comment: smoking cessation materials not required  Vaping Use  . Vaping Use: Never used  Substance Use Topics  . Alcohol use: No    Alcohol/week: 0.0 standard drinks  . Drug use: No     Allergies  Allergen Reactions  . Nickel   . Penicillins     Health Maintenance  Topic Date Due  . Hepatitis C Screening  Never done  . DEXA SCAN  07/18/2019  . MAMMOGRAM  09/30/2019  . TETANUS/TDAP  08/12/2026  . INFLUENZA VACCINE  Completed  . COVID-19 Vaccine  Completed  . PNA vac Low Risk  Adult  Completed    Chart Review Today: I personally reviewed active problem list, medication list, allergies, family history, social history, health maintenance, notes from last encounter, lab results, imaging with the patient/caregiver today.   Review of Systems  10 Systems reviewed and are negative for acute change except as noted in the HPI.  Objective:   Vitals:   09/12/20 0909  BP: 128/76  Pulse: 70  Resp: 16  Temp: 97.8 F (36.6 C)  TempSrc: Oral  SpO2: 98%  Weight: 183 lb (83 kg)  Height: 5\' 2"  (1.575 m)    Body mass index is 33.47 kg/m.  Physical Exam Vitals and nursing note reviewed.  Constitutional:      General: She is not in acute distress.    Appearance: Normal appearance. She is well-developed. She is obese. She is not ill-appearing, toxic-appearing or diaphoretic.     Interventions: Face mask in place.  HENT:     Head: Normocephalic and atraumatic.     Right Ear: External ear normal.     Left Ear: External ear normal.  Eyes:     General: Lids are normal. No scleral icterus.       Right eye: No discharge.        Left eye: No discharge.     Conjunctiva/sclera: Conjunctivae normal.  Neck:     Trachea: Phonation normal. No tracheal deviation.  Cardiovascular:     Rate and Rhythm: Normal rate and regular rhythm.     Pulses: Normal pulses.          Radial pulses are 2+ on the right side and 2+ on the left side.       Posterior tibial pulses are 2+ on the right side and 2+ on the left side.     Heart sounds: Normal heart sounds. No murmur heard.  No friction rub. No gallop.   Pulmonary:     Effort: Pulmonary effort is normal. No respiratory distress.     Breath sounds: Normal breath sounds. No stridor. No wheezing, rhonchi or rales.  Chest:     Chest wall: No tenderness.  Abdominal:     General: Bowel sounds are normal. There is no distension.     Palpations: Abdomen is soft.  Musculoskeletal:     Right hand: No deformity or bony tenderness.  Decreased range of motion. Normal capillary refill. Normal pulse.  Left hand: Decreased range of motion. Normal pulse.     Right lower leg: No edema.     Left lower leg: No edema.     Comments: Right- neg tinel's, pt cannot due phalen's due to limited ROM of b/l wrist  Skin:    General: Skin is warm and dry.     Coloration: Skin is not jaundiced or pale.     Findings: No rash.  Neurological:     Mental Status: She is alert.     Motor: No abnormal muscle tone.     Gait: Gait normal.  Psychiatric:        Mood and Affect: Mood normal.        Speech: Speech normal.        Behavior: Behavior normal.         Assessment & Plan:   1. Benign hypertension Stable, well controlled, continue HCTZ and micardis - COMPLETE METABOLIC PANEL WITH GFR  2. Prediabetes Diet dependent, recheck, last was normal over a year ago - COMPLETE METABOLIC PANEL WITH GFR - Hemoglobin A1c  3. Mixed hyperlipidemia Compliant with meds, no SE, no myalgias, fatigue or jaundice Monitor LFTs with CMP - COMPLETE METABOLIC PANEL WITH GFR  4. Obesity (BMI 30.0-34.9) She is active, cannot seem to loose any weight  5. Medication monitoring encounter - CBC with Differential/Platelet - COMPLETE METABOLIC PANEL WITH GFR - Hemoglobin A1c  6. Encounter for screening mammogram for malignant neoplasm of breast - MM 3D SCREEN BREAST BILATERAL; Future  7. Encounter for hepatitis C screening test for low risk patient - Hepatitis C antibody  8. Postmenopausal estrogen deficiency Due for f/up bone density - discussed supplements and other preventative measures, pt walking 2 miles a day - DG Bone Density; Future  9. Chronic pain of right wrist Right wrist pain in RHD female, ongoing for several years, failed conservative management, pt would like referral to specialists.  Decreased ROM of right wrist, neg phalens and tinels sign today, with activity or repetitive motions she has pain and numbness - Ambulatory  referral to Hand Surgery  Return in about 6 months (around 03/12/2021) for Routine follow-up.   Delsa Grana, PA-C 09/12/20 9:19 AM

## 2020-09-12 NOTE — Patient Instructions (Signed)
[facility] [address] [place],  [place]  [id number] Get Driving Directions Main: [phone]  

## 2020-09-13 LAB — CBC WITH DIFFERENTIAL/PLATELET
Absolute Monocytes: 328 cells/uL (ref 200–950)
Eosinophils Absolute: 151 cells/uL (ref 15–500)
Eosinophils Relative: 2.9 %
HCT: 37.7 % (ref 35.0–45.0)
Lymphs Abs: 1310 cells/uL (ref 850–3900)
MCHC: 33.2 g/dL (ref 32.0–36.0)
MCV: 86.7 fL (ref 80.0–100.0)
Monocytes Relative: 6.3 %
Neutro Abs: 3370 cells/uL (ref 1500–7800)
Neutrophils Relative %: 64.8 %
RBC: 4.35 10*6/uL (ref 3.80–5.10)
RDW: 13 % (ref 11.0–15.0)
WBC: 5.2 10*3/uL (ref 3.8–10.8)

## 2020-09-13 LAB — HEPATITIS C ANTIBODY
Hepatitis C Ab: NONREACTIVE
SIGNAL TO CUT-OFF: 0.01 (ref <1.00)

## 2020-09-13 LAB — COMPLETE METABOLIC PANEL WITH GFR
AG Ratio: 2.1 (calc) (ref 1.0–2.5)
ALT: 16 U/L (ref 6–29)
AST: 16 U/L (ref 10–35)
Albumin: 4.5 g/dL (ref 3.6–5.1)
Alkaline phosphatase (APISO): 84 U/L (ref 37–153)
BUN: 16 mg/dL (ref 7–25)
CO2: 30 mmol/L (ref 20–32)
Calcium: 9.3 mg/dL (ref 8.6–10.4)
Chloride: 104 mmol/L (ref 98–110)
Creat: 0.83 mg/dL (ref 0.60–0.93)
GFR, Est African American: 79 mL/min/{1.73_m2} (ref >=60)
GFR, Est Non African American: 68 mL/min/{1.73_m2} (ref >=60)
Globulin: 2.1 g/dL (calc) (ref 1.9–3.7)
Glucose, Bld: 112 mg/dL — ABNORMAL HIGH (ref 65–99)
Potassium: 4.4 mmol/L (ref 3.5–5.3)
Sodium: 141 mmol/L (ref 135–146)
Total Bilirubin: 0.8 mg/dL (ref 0.2–1.2)
Total Protein: 6.6 g/dL (ref 6.1–8.1)

## 2020-09-13 LAB — HEMOGLOBIN A1C
Hgb A1c MFr Bld: 5.6 % of total Hgb (ref <5.7)
Mean Plasma Glucose: 114 (calc)
eAG (mmol/L): 6.3 (calc)

## 2020-09-20 DIAGNOSIS — G5603 Carpal tunnel syndrome, bilateral upper limbs: Secondary | ICD-10-CM | POA: Insufficient documentation

## 2020-12-28 ENCOUNTER — Other Ambulatory Visit: Payer: Self-pay

## 2020-12-28 ENCOUNTER — Ambulatory Visit (INDEPENDENT_AMBULATORY_CARE_PROVIDER_SITE_OTHER): Payer: Medicare Other

## 2020-12-28 VITALS — BP 122/76 | HR 83 | Temp 98.1°F | Resp 16 | Ht 62.0 in | Wt 186.5 lb

## 2020-12-28 DIAGNOSIS — Z0000 Encounter for general adult medical examination without abnormal findings: Secondary | ICD-10-CM | POA: Diagnosis not present

## 2020-12-28 NOTE — Patient Instructions (Signed)
Susan Summers , Thank you for taking time to come for your Medicare Wellness Visit. I appreciate your ongoing commitment to your health goals. Please review the following plan we discussed and let me know if I can assist you in the future.   Screening recommendations/referrals: Colonoscopy: no longer required  Mammogram: done 09/29/18. Please call [phone] to schedule your mammogram and bone density screening.  Bone Density: done 07/17/17 Recommended yearly ophthalmology/optometry visit for glaucoma screening and checkup Recommended yearly dental visit for hygiene and checkup  Vaccinations: Influenza vaccine: done 08/23/20 Pneumococcal vaccine: done 12/16/16 Tdap vaccine: done 08/12/16 Shingles vaccine: Shingrix discussed. Please contact your pharmacy for coverage information.  Covid-19: done 11/16/19, 12/07/19 & 08/08/20  Conditions/risks identified: Keep up the great work!  Next appointment: Follow up in one year for your annual wellness visit    Preventive Care 65 Years and Older, Female Preventive care refers to lifestyle choices and visits with your health care provider that can promote health and wellness. What does preventive care include?  A yearly physical exam. This is also called an annual well check.  Dental exams once or twice a year.  Routine eye exams. Ask your health care provider how often you should have your eyes checked.  Personal lifestyle choices, including:  Daily care of your teeth and gums.  Regular physical activity.  Eating a healthy diet.  Avoiding tobacco and drug use.  Limiting alcohol use.  Practicing safe sex.  Taking low-dose aspirin every day.  Taking vitamin and mineral supplements as recommended by your health care provider. What happens during an annual well check? The services and screenings done by your health care provider during your annual well check will depend on your age, overall health, lifestyle risk factors, and family history  of disease. Counseling  Your health care provider may ask you questions about your:  Alcohol use.  Tobacco use.  Drug use.  Emotional well-being.  Home and relationship well-being.  Sexual activity.  Eating habits.  History of falls.  Memory and ability to understand (cognition).  Work and work Statistician.  Reproductive health. Screening  You may have the following tests or measurements:  Height, weight, and BMI.  Blood pressure.  Lipid and cholesterol levels. These may be checked every 5 years, or more frequently if you are over 37 years old.  Skin check.  Lung cancer screening. You may have this screening every year starting at age 8 if you have a 30-pack-year history of smoking and currently smoke or have quit within the past 15 years.  Fecal occult blood test (FOBT) of the stool. You may have this test every year starting at age 64.  Flexible sigmoidoscopy or colonoscopy. You may have a sigmoidoscopy every 5 years or a colonoscopy every 10 years starting at age 4.  Hepatitis C blood test.  Hepatitis B blood test.  Sexually transmitted disease (STD) testing.  Diabetes screening. This is done by checking your blood sugar (glucose) after you have not eaten for a while (fasting). You may have this done every 1-3 years.  Bone density scan. This is done to screen for osteoporosis. You may have this done starting at age 49.  Mammogram. This may be done every 1-2 years. Talk to your health care provider about how often you should have regular mammograms. Talk with your health care provider about your test results, treatment options, and if necessary, the need for more tests. Vaccines  Your health care provider may recommend certain vaccines, such as:  Influenza vaccine. This is recommended every year.  Tetanus, diphtheria, and acellular pertussis (Tdap, Td) vaccine. You may need a Td booster every 10 years.  Zoster vaccine. You may need this after age  7.  Pneumococcal 13-valent conjugate (PCV13) vaccine. One dose is recommended after age 54.  Pneumococcal polysaccharide (PPSV23) vaccine. One dose is recommended after age 41. Talk to your health care provider about which screenings and vaccines you need and how often you need them. This information is not intended to replace advice given to you by your health care provider. Make sure you discuss any questions you have with your health care provider. Document Released: 11/24/2015 Document Revised: 07/17/2016 Document Reviewed: 08/29/2015 Elsevier Interactive Patient Education  2017 [place] Prevention in the Home Falls can cause injuries. They can happen to people of all ages. There are many things you can do to make your home safe and to help prevent falls. What can I do on the outside of my home?  Regularly fix the edges of walkways and driveways and fix any cracks.  Remove anything that might make you trip as you walk through a door, such as a raised step or threshold.  Trim any bushes or trees on the path to your home.  Use bright outdoor lighting.  Clear any walking paths of anything that might make someone trip, such as rocks or tools.  Regularly check to see if handrails are loose or broken. Make sure that both sides of any steps have handrails.  Any raised decks and porches should have guardrails on the edges.  Have any leaves, snow, or ice cleared regularly.  Use sand or salt on walking paths during winter.  Clean up any spills in your garage right away. This includes oil or grease spills. What can I do in the bathroom?  Use night lights.  Install grab bars by the toilet and in the tub and shower. Do not use towel bars as grab bars.  Use non-skid mats or decals in the tub or shower.  If you need to sit down in the shower, use a plastic, non-slip stool.  Keep the floor dry. Clean up any water that spills on the floor as soon as it happens.  Remove  soap buildup in the tub or shower regularly.  Attach bath mats securely with double-sided non-slip rug tape.  Do not have throw rugs and other things on the floor that can make you trip. What can I do in the bedroom?  Use night lights.  Make sure that you have a light by your bed that is easy to reach.  Do not use any sheets or blankets that are too big for your bed. They should not hang down onto the floor.  Have a firm chair that has side arms. You can use this for support while you get dressed.  Do not have throw rugs and other things on the floor that can make you trip. What can I do in the kitchen?  Clean up any spills right away.  Avoid walking on wet floors.  Keep items that you use a lot in easy-to-reach places.  If you need to reach something above you, use a strong step stool that has a grab bar.  Keep electrical cords out of the way.  Do not use floor polish or wax that makes floors slippery. If you must use wax, use non-skid floor wax.  Do not have throw rugs and other things on the floor that  can make you trip. What can I do with my stairs?  Do not leave any items on the stairs.  Make sure that there are handrails on both sides of the stairs and use them. Fix handrails that are broken or loose. Make sure that handrails are as long as the stairways.  Check any carpeting to make sure that it is firmly attached to the stairs. Fix any carpet that is loose or worn.  Avoid having throw rugs at the top or bottom of the stairs. If you do have throw rugs, attach them to the floor with carpet tape.  Make sure that you have a light switch at the top of the stairs and the bottom of the stairs. If you do not have them, ask someone to add them for you. What else can I do to help prevent falls?  Wear shoes that:  Do not have high heels.  Have rubber bottoms.  Are comfortable and fit you well.  Are closed at the toe. Do not wear sandals.  If you use a  stepladder:  Make sure that it is fully opened. Do not climb a closed stepladder.  Make sure that both sides of the stepladder are locked into place.  Ask someone to hold it for you, if possible.  Clearly mark and make sure that you can see:  Any grab bars or handrails.  First and last steps.  Where the edge of each step is.  Use tools that help you move around (mobility aids) if they are needed. These include:  Canes.  Walkers.  Scooters.  Crutches.  Turn on the lights when you go into a dark area. Replace any light bulbs as soon as they burn out.  Set up your furniture so you have a clear path. Avoid moving your furniture around.  If any of your floors are uneven, fix them.  If there are any pets around you, be aware of where they are.  Review your medicines with your doctor. Some medicines can make you feel dizzy. This can increase your chance of falling. Ask your doctor what other things that you can do to help prevent falls. This information is not intended to replace advice given to you by your health care provider. Make sure you discuss any questions you have with your health care provider. Document Released: 08/24/2009 Document Revised: 04/04/2016 Document Reviewed: 12/02/2014 Elsevier Interactive Patient Education  2017 [employer].

## 2020-12-28 NOTE — Progress Notes (Signed)
Subjective:   DAYA DUTT is a 80 y.o. female who presents for Medicare Annual (Subsequent) preventive examination.  Review of Systems     Cardiac Risk Factors include: advanced age (>51men, >34 women);dyslipidemia;hypertension;obesity (BMI >30kg/m2)     Objective:    Today's Vitals   12/28/20 0929  BP: 122/76  Pulse: 83  Resp: 16  Temp: 98.1 F (36.7 C)  TempSrc: Oral  SpO2: 97%  Weight: 186 lb 8 oz (84.6 kg)  Height: 5\' 2"  (1.575 m)   Body mass index is 34.11 kg/m.  Advanced Directives 12/28/2020 12/28/2019 12/24/2018 12/19/2017 08/07/2017 03/12/2017 02/10/2017  Does Patient Have a Medical Advance Directive? Yes No No Yes Yes Yes Yes  Type of Paramedic of [place];Living will - - [place];Living will - Living will Living will  Does patient want to make changes to medical advance directive? - - - - - - -  Copy of [place] in Chart? Yes - validated most recent copy scanned in chart (See row information) - - No - copy requested - - -  Would patient like information on creating a medical advance directive? - No - Patient declined No - Patient declined - - - -    Current Medications (verified) Outpatient Encounter Medications as of 12/28/2020  Medication Sig  . Cholecalciferol (VITAMIN D PO) Take 4,000 Units by mouth daily.   Marland Kitchen ELDERBERRY PO Take 1,250 mg by mouth daily.   . Fluocinolone Acetonide 0.01 % OIL Administer 1 drop into both ears Two (2) times a day. Prn for itch  . Garlic 299 MG TABS Take by mouth daily.  . hydrochlorothiazide (MICROZIDE) 12.5 MG capsule Take 1 capsule (12.5 mg total) by mouth daily.  . meloxicam (MOBIC) 15 MG tablet Take 15 mg by mouth daily. PRN  . Omega-3 Fatty Acids (FISH OIL) 1000 MG CAPS Take 2 capsules by mouth daily.   . Probiotic Product (PROBIOTIC DAILY PO) Take by mouth daily.   . rosuvastatin (CRESTOR) 20 MG tablet Take 1 tablet (20 mg total) by mouth at bedtime.  Marland Kitchen  telmisartan (MICARDIS) 80 MG tablet Take 1 tablet (80 mg total) by mouth daily.  . Turmeric 400 MG CAPS Take 2 capsules by mouth daily.  . [DISCONTINUED] Misc Natural Products (TART CHERRY ADVANCED PO) Take 1 capsule by mouth daily.   No facility-administered encounter medications on file as of 12/28/2020.    Allergies (verified) Nickel and Penicillins   History: Past Medical History:  Diagnosis Date  . At risk for falling 02/04/2018  . Carpal tunnel syndrome   . Herpes zona 02/04/2018   T8 right  . Hyperlipidemia   . Hypertension   . Osteopenia   . Prediabetes   . Psoriasis 02/04/2018   elbow  . Swelling of both ankles 08/09/2015   Past Surgical History:  Procedure Laterality Date  . ABDOMINAL HYSTERECTOMY    . APPENDECTOMY    . BREAST BIOPSY Left 10/20/2018   [place] bx, path pending  . HERNIA REPAIR    . oopherrectomy  2012  . UMBILICAL HERNIA REPAIR  2012   Family History  Problem Relation Age of Onset  . Hypertension Mother   . Stroke Mother   . Rheum arthritis Mother   . Diabetes Father   . Cancer Sister        Ovarian  . Diabetes Sister   . Hypertension Brother   . Melanoma Daughter   . Hypertension Son   .  Hyperlipidemia Son   . Stroke Brother   . Cancer Brother        Prostate  . Other Brother        bowel obstruction  . Hyperlipidemia Sister   . Cancer Sister        skin- SCC removed   Social History   Socioeconomic History  . Marital status: Widowed    Spouse name: Zenia Resides  . Number of children: 2  . Years of education: Not on file  . Highest education level: 12th grade  Occupational History  . Occupation: Retired  Tobacco Use  . Smoking status: Never Smoker  . Smokeless tobacco: Never Used  . Tobacco comment: smoking cessation materials not required  Vaping Use  . Vaping Use: Never used  Substance and Sexual Activity  . Alcohol use: No    Alcohol/week: 0.0 standard drinks  . Drug use: No  . Sexual activity: Not Currently  Other Topics  Concern  . Not on file  Social History Narrative   Pt lives alone   Social Determinants of Health   Financial Resource Strain: Low Risk   . Difficulty of Paying Living Expenses: Not hard at all  Food Insecurity: No Food Insecurity  . Worried About Charity fundraiser in the Last Year: Never true  . Ran Out of Food in the Last Year: Never true  Transportation Needs: No Transportation Needs  . Lack of Transportation (Medical): No  . Lack of Transportation (Non-Medical): No  Physical Activity: Sufficiently Active  . Days of Exercise per Week: 5 days  . Minutes of Exercise per Session: 40 min  Stress: No Stress Concern Present  . Feeling of Stress : Not at all  Social Connections: Moderately Integrated  . Frequency of Communication with Friends and Family: More than three times a week  . Frequency of Social Gatherings with Friends and Family: Three times a week  . Attends Religious Services: More than 4 times per year  . Active Member of Clubs or Organizations: Yes  . Attends Archivist Meetings: More than 4 times per year  . Marital Status: Widowed    Tobacco Counseling Counseling given: Not Answered Comment: smoking cessation materials not required   Clinical Intake:  Pre-visit preparation completed: Yes  Pain : No/denies pain     BMI - recorded: 34.11 Nutritional Status: BMI > 30  Obese Nutritional Risks: None Diabetes: No  How often do you need to have someone help you when you read instructions, pamphlets, or other written materials from your doctor or pharmacy?: 1 - Never    Interpreter Needed?: No  Information entered by :: Clemetine Marker LPN   Activities of Daily Living In your present state of health, do you have any difficulty performing the following activities: 12/28/2020 09/12/2020  Hearing? N N  Comment declines hearing aids -  Vision? N N  Difficulty concentrating or making decisions? N N  Walking or climbing stairs? N N  Dressing or  bathing? N N  Doing errands, shopping? N N  Preparing Food and eating ? N -  Using the Toilet? N -  In the past six months, have you accidently leaked urine? Y -  Comment wears pads for protection -  Do you have problems with loss of bowel control? N -  Managing your Medications? N -  Managing your Finances? N -  Housekeeping or managing your Housekeeping? N -  Some recent data might be hidden    Patient Care  Team: Delsa Grana, PA-C as PCP - General (Family Medicine) Earnestine Leys, MD (Orthopedic Surgery) Leandrew Koyanagi, MD as Referring Physician (Ophthalmology)  Indicate any recent Medical Services you may have received from other than [facility] providers in the past year (date may be approximate).     Assessment:   This is a routine wellness examination for [place].  Hearing/Vision screen  Hearing Screening   125Hz  250Hz  500Hz  1000Hz  2000Hz  3000Hz  4000Hz  6000Hz  8000Hz   Right ear:           Left ear:           Comments: Pt denies hearing difficulty  Vision Screening Comments: Annual vision screenings at [facility] issues and exercise activities discussed: Current Exercise Habits: Home exercise routine, Type of exercise: walking, Time (Minutes): 40, Frequency (Times/Week): 5, Weekly Exercise (Minutes/Week): 200, Intensity: Moderate, Exercise limited by: None identified  Goals    . DIET - INCREASE WATER INTAKE     Recommend to drink at least 6-8 8oz glasses of water per day.    . Exercise      Starting 12/16/16, I will start exercising 3 days week for 30 minutes.       Depression Screen PHQ 2/9 Scores 12/28/2020 09/12/2020 02/28/2020 12/28/2019 08/30/2019 12/24/2018 08/07/2018  PHQ - 2 Score 0 0 0 0 0 0 0  PHQ- 9 Score - - 0 - 0 - -    Fall Risk Fall Risk  12/28/2020 09/12/2020 02/28/2020 12/28/2019 08/30/2019  Falls in the past year? 0 0 0 0 0  Comment - - - - -  Number falls in past yr: 0 0 0 0 0  Injury with Fall? 0 0 0 0 0  Risk for fall due to : No  Fall Risks - - No Fall Risks -  Risk for fall due to: Comment - - - - -  Follow up Falls prevention discussed Falls evaluation completed - Falls prevention discussed -    FALL RISK PREVENTION PERTAINING TO THE HOME:  Any stairs in or around the home? Yes  If so, are there any without handrails? No  Home free of loose throw rugs in walkways, pet beds, electrical cords, etc? Yes  Adequate lighting in your home to reduce risk of falls? Yes   ASSISTIVE DEVICES UTILIZED TO PREVENT FALLS:  Life alert? No  Use of a cane, walker or w/c? No  Grab bars in the bathroom? Yes  Shower chair or bench in shower? No  Elevated toilet seat or a handicapped toilet? Yes   TIMED UP AND GO:  Was the test performed? Yes .  Length of time to ambulate 10 feet: 5 sec.   Gait steady and fast without use of assistive device  Cognitive Function: Normal cognitive status assessed by direct observation by this Nurse Health Advisor. No abnormalities found.       6CIT Screen 12/28/2019 12/24/2018 12/19/2017 12/16/2016  What Year? 0 points 0 points 0 points 0 points  What month? 0 points 0 points 0 points 0 points  What time? 0 points 0 points 0 points 0 points  Count back from 20 0 points 0 points 0 points 0 points  Months in reverse 0 points 0 points 0 points 0 points  Repeat phrase 0 points 0 points 2 points 0 points  Total Score 0 0 2 0    Immunizations Immunization History  Administered Date(s) Administered  . Fluad Quad(high Dose 65+) 07/21/2019  . Influenza, High Dose Seasonal PF  08/07/2017, 08/07/2018  . Influenza,inj,quad, With Preservative 08/11/2020  . Influenza-Unspecified 07/12/2014, 08/02/2015, 08/12/2016, 08/23/2020  . PFIZER(Purple Top)SARS-COV-2 Vaccination 11/16/2019, 12/07/2019, 08/08/2020  . Pneumococcal Conjugate-13 07/26/2014  . Pneumococcal Polysaccharide-23 12/16/2016  . Td 11/12/2003  . Tdap 08/12/2016    TDAP status: Up to date  Flu Vaccine status: Up to date  Pneumococcal  vaccine status: Up to date  Covid-19 vaccine status: Completed vaccines  Qualifies for Shingles Vaccine? Yes   Zostavax completed No   Shingrix Completed?: No.    Education has been provided regarding the importance of this vaccine. Patient has been advised to call insurance company to determine out of pocket expense if they have not yet received this vaccine. Advised may also receive vaccine at local pharmacy or Health Dept. Verbalized acceptance and understanding.  Screening Tests Health Maintenance  Topic Date Due  . DEXA SCAN  07/18/2019  . MAMMOGRAM  09/30/2019  . COVID-19 Vaccine (4 - Booster for Pfizer series) 02/05/2021  . TETANUS/TDAP  08/12/2026  . INFLUENZA VACCINE  Completed  . Hepatitis C Screening  Completed  . PNA vac Low Risk Adult  Completed     Health Maintenance  Health Maintenance Due  Topic Date Due  . DEXA SCAN  07/18/2019  . MAMMOGRAM  09/30/2019    Colorectal cancer screening: No longer required.   Mammogram status: Completed 09/29/18. Repeat every year  Bone Density status: Completed 07/17/17. Results reflect: Bone density results: NORMAL. Repeat every 2 years.  Lung Cancer Screening: (Low Dose CT Chest recommended if Age 1-80 years, 30 pack-year currently smoking OR have quit w/in 15years.) does not qualify.   Additional Screening:  Hepatitis C Screening: does qualify; Completed 09/12/20  Vision Screening: Recommended annual ophthalmology exams for early detection of glaucoma and other disorders of the eye. Is the patient up to date with their annual eye exam?  Yes  Who is the provider or what is the name of the office in which the patient attends annual eye exams? [place] Screening: Recommended annual dental exams for proper oral hygiene  Community Resource Referral / Chronic Care Management: CRR required this visit?  No   CCM required this visit?  No      Plan:     I have personally reviewed and noted the following  in the patient's chart:   . Medical and social history . Use of alcohol, tobacco or illicit drugs  . Current medications and supplements . Functional ability and status . Nutritional status . Physical activity . Advanced directives . List of other physicians . Hospitalizations, surgeries, and ER visits in previous 12 months . Vitals . Screenings to include cognitive, depression, and falls . Referrals and appointments  In addition, I have reviewed and discussed with patient certain preventive protocols, quality metrics, and best practice recommendations. A written personalized care plan for preventive services as well as general preventive health recommendations were provided to patient.     Clemetine Marker, LPN   7/78/2423   Nurse Notes: none

## 2021-03-13 ENCOUNTER — Ambulatory Visit (INDEPENDENT_AMBULATORY_CARE_PROVIDER_SITE_OTHER): Payer: Medicare Other | Admitting: Family Medicine

## 2021-03-13 ENCOUNTER — Other Ambulatory Visit: Payer: Self-pay

## 2021-03-13 ENCOUNTER — Encounter: Payer: Self-pay | Admitting: Family Medicine

## 2021-03-13 VITALS — BP 118/68 | HR 75 | Temp 98.1°F | Resp 16 | Ht 62.0 in | Wt 189.4 lb

## 2021-03-13 DIAGNOSIS — R7301 Impaired fasting glucose: Secondary | ICD-10-CM

## 2021-03-13 DIAGNOSIS — Z5181 Encounter for therapeutic drug level monitoring: Secondary | ICD-10-CM | POA: Diagnosis not present

## 2021-03-13 DIAGNOSIS — G5603 Carpal tunnel syndrome, bilateral upper limbs: Secondary | ICD-10-CM

## 2021-03-13 DIAGNOSIS — E782 Mixed hyperlipidemia: Secondary | ICD-10-CM | POA: Diagnosis not present

## 2021-03-13 DIAGNOSIS — I10 Essential (primary) hypertension: Secondary | ICD-10-CM | POA: Diagnosis not present

## 2021-03-13 MED ORDER — MELOXICAM 15 MG PO TABS: 15.0000 mg | ORAL_TABLET | Freq: Every day | ORAL | 5 refills | Status: DC

## 2021-03-13 MED ORDER — TELMISARTAN 80 MG PO TABS
80.0000 mg | ORAL_TABLET | Freq: Every day | ORAL | 3 refills | Status: DC
Start: 2021-03-13 — End: 2022-03-14

## 2021-03-13 MED ORDER — HYDROCHLOROTHIAZIDE 12.5 MG PO CAPS: 12.5000 mg | ORAL_CAPSULE | Freq: Every day | ORAL | 3 refills | Status: DC

## 2021-03-13 MED ORDER — ROSUVASTATIN CALCIUM 20 MG PO TABS: 20.0000 mg | ORAL_TABLET | Freq: Every day | ORAL | 3 refills | Status: DC

## 2021-03-13 NOTE — Progress Notes (Signed)
Name: Susan Summers   MRN: [id number]    DOB: 03-18-1941   Date:03/13/2021       Progress Note  Chief Complaint  Patient presents with  . Hyperlipidemia  . Hypertension     Subjective:   Susan Summers is a 80 y.o. female, presents to clinic for routine follow-up  Hypertension:  Currently managed on hydrochlorothiazide 12.5 mg and telmisartan 80 mg Pt reports good med compliance and denies any SE.   Blood pressure today is well controlled. BP Readings from Last 3 Encounters:  03/13/21 118/68  12/28/20 122/76  09/12/20 128/76   Pt denies CP, SOB, exertional sx, LE edema, palpitation, Ha's, visual disturbances, lightheadedness, hypotension, syncope.  Just did booster - waiting for 6 weeks after shot from 3/31 to schedule mammo and dexa  Hyperlipidemia: Currently treated with Crestor 20 mg good, pt reports good med compliance Last Lipids: Lab Results  Component Value Date   CHOL 135 02/28/2020   HDL 37 (L) 02/28/2020   LDLCALC 75 02/28/2020   TRIG 147 02/28/2020   CHOLHDL 3.6 02/28/2020   - Denies: Chest pain, shortness of breath, myalgias, claudication  Carpal tunnel b/l - per specialists      Current Outpatient Medications:  .  Cholecalciferol (VITAMIN D PO), Take 4,000 Units by mouth daily. , Disp: , Rfl:  .  ELDERBERRY PO, Take 1,250 mg by mouth daily. , Disp: , Rfl:  .  Garlic 867 MG TABS, Take by mouth daily., Disp: , Rfl:  .  hydrochlorothiazide (MICROZIDE) 12.5 MG capsule, Take 1 capsule (12.5 mg total) by mouth daily., Disp: 90 capsule, Rfl: 3 .  meloxicam (MOBIC) 15 MG tablet, Take 15 mg by mouth daily. PRN, Disp: , Rfl:  .  Probiotic Product (PROBIOTIC DAILY PO), Take by mouth daily. , Disp: , Rfl:  .  rosuvastatin (CRESTOR) 20 MG tablet, Take 1 tablet (20 mg total) by mouth at bedtime., Disp: 90 tablet, Rfl: 3 .  telmisartan (MICARDIS) 80 MG tablet, Take 1 tablet (80 mg total) by mouth daily., Disp: 90 tablet, Rfl: 3  Patient Active Problem List    Diagnosis Date Noted  . Bilateral carpal tunnel syndrome 09/20/2020  . Obesity (BMI 30.0-34.9) 08/07/2018  . Cramp in muscle 02/04/2018  . H/O malignant neoplasm of skin 02/04/2018  . Osteopenia of the elderly 02/04/2018  . Post menopausal syndrome 02/04/2018  . Chronic lower back pain 02/04/2018  . Prediabetes 11/13/2016  . Benign hypertension 04/25/2015  . Hyperlipidemia 04/25/2015  . Plantar fasciitis 04/25/2015    Past Surgical History:  Procedure Laterality Date  . ABDOMINAL HYSTERECTOMY    . APPENDECTOMY    . BREAST BIOPSY Left 10/20/2018   [place] bx, path pending  . HERNIA REPAIR    . oopherrectomy  2012  . UMBILICAL HERNIA REPAIR  2012    Family History  Problem Relation Age of Onset  . Hypertension Mother   . Stroke Mother   . Rheum arthritis Mother   . Diabetes Father   . Cancer Sister        Ovarian  . Diabetes Sister   . Hypertension Brother   . Melanoma Daughter   . Hypertension Son   . Hyperlipidemia Son   . Stroke Brother   . Cancer Brother        Prostate  . Other Brother        bowel obstruction  . Hyperlipidemia Sister   . Cancer Sister  skin- SCC removed    Social History   Tobacco Use  . Smoking status: Never Smoker  . Smokeless tobacco: Never Used  . Tobacco comment: smoking cessation materials not required  Vaping Use  . Vaping Use: Never used  Substance Use Topics  . Alcohol use: No    Alcohol/week: 0.0 standard drinks  . Drug use: No     Allergies  Allergen Reactions  . Nickel   . Penicillins     Health Maintenance  Topic Date Due  . DEXA SCAN  07/18/2019  . MAMMOGRAM  09/30/2019  . COVID-19 Vaccine (4 - Booster for Pfizer series) 02/05/2021  . INFLUENZA VACCINE  06/11/2021  . TETANUS/TDAP  08/12/2026  . Hepatitis C Screening  Completed  . PNA vac Low Risk Adult  Completed  . HPV VACCINES  Aged Out    Chart Review Today: I personally reviewed active problem list, medication list, allergies, family history,  social history, health maintenance, notes from last encounter, lab results, imaging with the patient/caregiver today.   Review of Systems  All other systems reviewed and are negative for acute change except as noted in the HPI.  Objective:   Vitals:   03/13/21 0855  BP: 118/68  Pulse: 75  Resp: 16  Temp: 98.1 F (36.7 C)  SpO2: 97%  Weight: 189 lb 6.4 oz (85.9 kg)  Height: 5\' 2"  (1.575 m)    Body mass index is 34.64 kg/m.  Physical Exam Vitals and nursing note reviewed.  Constitutional:      General: She is not in acute distress.    Appearance: Normal appearance. She is well-developed. She is obese. She is not ill-appearing, toxic-appearing or diaphoretic.     Interventions: Face mask in place.  HENT:     Head: Normocephalic and atraumatic.     Right Ear: External ear normal.     Left Ear: External ear normal.  Eyes:     General: Lids are normal. No scleral icterus.       Right eye: No discharge.        Left eye: No discharge.     Conjunctiva/sclera: Conjunctivae normal.  Neck:     Trachea: Phonation normal. No tracheal deviation.  Cardiovascular:     Rate and Rhythm: Normal rate and regular rhythm.     Pulses: Normal pulses.          Radial pulses are 2+ on the right side and 2+ on the left side.       Posterior tibial pulses are 2+ on the right side and 2+ on the left side.     Heart sounds: Normal heart sounds. No murmur heard. No friction rub. No gallop.   Pulmonary:     Effort: Pulmonary effort is normal. No respiratory distress.     Breath sounds: Normal breath sounds. No stridor. No wheezing, rhonchi or rales.  Chest:     Chest wall: No tenderness.  Abdominal:     General: Bowel sounds are normal. There is no distension.     Palpations: Abdomen is soft.  Musculoskeletal:     Right lower leg: No edema.     Left lower leg: No edema.  Skin:    General: Skin is warm and dry.     Coloration: Skin is not jaundiced or pale.     Findings: No rash.   Neurological:     Mental Status: She is alert.     Motor: No abnormal muscle tone.  Gait: Gait normal.  Psychiatric:        Mood and Affect: Mood normal.        Speech: Speech normal.        Behavior: Behavior normal.         Assessment & Plan:     ICD-10-CM   1. Benign hypertension  I10 hydrochlorothiazide (MICROZIDE) 12.5 MG capsule    COMPLETE METABOLIC PANEL WITH GFR    telmisartan (MICARDIS) 80 MG tablet   Well-controlled consider stopping HCTZ, patient states blood pressure is lower when monitoring at home, no symptoms of hypotension  2. Mixed hyperlipidemia  E78.2 rosuvastatin (CRESTOR) 20 MG tablet    Lipid panel    COMPLETE METABOLIC PANEL WITH GFR   Has previously been well controlled with statin she is tolerating medications and compliant recheck labs today  3. Impaired fasting glucose  S50.53 COMPLETE METABOLIC PANEL WITH GFR    Hemoglobin A1c  4. Medication monitoring encounter  Z51.81 hydrochlorothiazide (MICROZIDE) 12.5 MG capsule    rosuvastatin (CRESTOR) 20 MG tablet    Lipid panel    COMPLETE METABOLIC PANEL WITH GFR    CBC with Differential/Platelet    Hemoglobin A1c    telmisartan (MICARDIS) 80 MG tablet  5. Bilateral carpal tunnel syndrome  G56.03 meloxicam (MOBIC) 15 MG tablet   on mobic prn, using brace, follows with Dr. Sabra Heck - f/up when and if needed     Return in about 6 months (around 09/13/2021) for Routine follow-up.   Delsa Grana, PA-C 03/13/21 9:28 AM

## 2021-03-13 NOTE — Patient Instructions (Signed)
Health Maintenance  Topic Date Due  . DEXA scan (bone density measurement)  07/18/2019  . Mammogram  09/30/2019  . COVID-19 Vaccine (4 - Booster for Pfizer series) 02/05/2021  . Flu Shot  06/11/2021  . Tetanus Vaccine  08/12/2026  .  Hepatitis C: One time screening is recommended by Center for Disease Control  (CDC) for  adults born from [birth year] through 1963.   Completed  . Pneumonia vaccines  Completed  . HPV Vaccine  [place] [facility] [place],  [place]  [id number] Get Driving Directions Main: [phone]    You can keep taking mobic as needed, just do not take with other NSAIDS same day like ibuprofen, aleve, naproxen but you can take with tylenol if needed for pain.

## 2021-03-14 LAB — COMPLETE METABOLIC PANEL WITH GFR
AG Ratio: 2.1 (calc) (ref 1.0–2.5)
ALT: 16 U/L (ref 6–29)
AST: 16 U/L (ref 10–35)
Albumin: 4.5 g/dL (ref 3.6–5.1)
Alkaline phosphatase (APISO): 75 U/L (ref 37–153)
BUN: 19 mg/dL (ref 7–25)
CO2: 32 mmol/L (ref 20–32)
Calcium: 9.2 mg/dL (ref 8.6–10.4)
Chloride: 105 mmol/L (ref 98–110)
Creat: 0.78 mg/dL (ref 0.60–0.93)
GFR, Est African American: 85 mL/min/{1.73_m2} (ref >=60)
GFR, Est Non African American: 73 mL/min/{1.73_m2} (ref >=60)
Globulin: 2.1 g/dL (calc) (ref 1.9–3.7)
Glucose, Bld: 104 mg/dL — ABNORMAL HIGH (ref 65–99)
Potassium: 4.1 mmol/L (ref 3.5–5.3)
Sodium: 143 mmol/L (ref 135–146)
Total Bilirubin: 1 mg/dL (ref 0.2–1.2)
Total Protein: 6.6 g/dL (ref 6.1–8.1)

## 2021-03-14 LAB — CBC WITH DIFFERENTIAL/PLATELET
Absolute Monocytes: 493 cells/uL (ref 200–950)
Basophils Absolute: 51 cells/uL (ref 0–200)
Basophils Relative: 0.8 %
Eosinophils Absolute: 269 cells/uL (ref 15–500)
Eosinophils Relative: 4.2 %
HCT: 39.6 % (ref 35.0–45.0)
Hemoglobin: 12.9 g/dL (ref 11.7–15.5)
Lymphs Abs: 1555 cells/uL (ref 850–3900)
MCH: 28 pg (ref 27.0–33.0)
MCHC: 32.6 g/dL (ref 32.0–36.0)
MCV: 85.9 fL (ref 80.0–100.0)
MPV: 10.6 fL (ref 7.5–12.5)
Monocytes Relative: 7.7 %
Neutro Abs: 4032 cells/uL (ref 1500–7800)
Neutrophils Relative %: 63 %
Platelets: 188 10*3/uL (ref 140–400)
RBC: 4.61 10*6/uL (ref 3.80–5.10)
RDW: 13.2 % (ref 11.0–15.0)
Total Lymphocyte: 24.3 %
WBC: 6.4 10*3/uL (ref 3.8–10.8)

## 2021-03-14 LAB — LIPID PANEL
Cholesterol: 130 mg/dL (ref <200)
HDL: 36 mg/dL — ABNORMAL LOW (ref >=50)
LDL Cholesterol (Calc): 68 mg/dL (calc)
Non-HDL Cholesterol (Calc): 94 mg/dL (calc) (ref <130)
Total CHOL/HDL Ratio: 3.6 (calc) (ref <5.0)
Triglycerides: 182 mg/dL — ABNORMAL HIGH (ref <150)

## 2021-03-14 LAB — HEMOGLOBIN A1C
Hgb A1c MFr Bld: 5.6 % of total Hgb (ref <5.7)
Mean Plasma Glucose: 114 mg/dL
eAG (mmol/L): 6.3 mmol/L

## 2021-05-15 ENCOUNTER — Other Ambulatory Visit: Payer: Self-pay

## 2021-05-15 ENCOUNTER — Ambulatory Visit: Payer: Medicare Other | Admitting: Dermatology

## 2021-05-15 DIAGNOSIS — D1800 Hemangioma unspecified site: Secondary | ICD-10-CM

## 2021-05-15 DIAGNOSIS — L814 Other melanin hyperpigmentation: Secondary | ICD-10-CM

## 2021-05-15 DIAGNOSIS — D2239 Melanocytic nevi of other parts of face: Secondary | ICD-10-CM

## 2021-05-15 DIAGNOSIS — Z1283 Encounter for screening for malignant neoplasm of skin: Secondary | ICD-10-CM

## 2021-05-15 DIAGNOSIS — L578 Other skin changes due to chronic exposure to nonionizing radiation: Secondary | ICD-10-CM

## 2021-05-15 DIAGNOSIS — L821 Other seborrheic keratosis: Secondary | ICD-10-CM

## 2021-05-15 DIAGNOSIS — D229 Melanocytic nevi, unspecified: Secondary | ICD-10-CM

## 2021-05-15 DIAGNOSIS — I781 Nevus, non-neoplastic: Secondary | ICD-10-CM | POA: Diagnosis not present

## 2021-05-15 DIAGNOSIS — L820 Inflamed seborrheic keratosis: Secondary | ICD-10-CM

## 2021-05-15 NOTE — Patient Instructions (Addendum)
If you have any questions or concerns for your doctor, please call our main line at [phone] and press option 4 to reach your doctor's medical assistant. If no one answers, please leave a voicemail as directed and we will return your call as soon as possible. Messages left after 4 pm will be answered the following business day.   You may also send [place] a message via [place]. We typically respond to MyChart messages within 1-2 business days.  For prescription refills, please ask your pharmacy to contact our office. Our fax number is [phone].  If you have an urgent issue when the clinic is closed that cannot wait until the next business day, you can page your doctor at the number below.    Please note that while we do our best to be available for urgent issues outside of office hours, we are not available 24/7.   If you have an urgent issue and are unable to reach [place], you may choose to seek medical care at your doctor's office, retail clinic, urgent care center, or emergency room.  If you have a medical emergency, please immediately call 911 or go to the emergency department.  Pager Numbers  - Dr. Nehemiah Massed: [phone]  - Dr. Laurence Ferrari: [phone]  - Dr. Nicole Kindred: [phone]  In the event of inclement weather, please call our main line at [phone] for an update on the status of any delays or closures.  Dermatology Medication Tips: Please keep the boxes that topical medications come in in order to help keep track of the instructions about where and how to use these. Pharmacies typically print the medication instructions only on the boxes and not directly on the medication tubes.   If your medication is too expensive, please contact our office at [phone] option 4 or send [place] a message through [place].   We are unable to tell what your co-pay for medications will be in advance as this is different depending on your insurance coverage. However, we may be able to find a substitute  medication at lower cost or fill out paperwork to get insurance to cover a needed medication.   If a prior authorization is required to get your medication covered by your insurance company, please allow [place] 1-2 business days to complete this process.  Drug prices often vary depending on where the prescription is filled and some pharmacies may offer cheaper prices.  The website www.goodrx.com contains coupons for medications through different pharmacies. The prices here do not account for what the cost may be with help from insurance (it may be cheaper with your insurance), but the website can give you the price if you did not use any insurance.  - You can print the associated coupon and take it with your prescription to the pharmacy.  - You may also stop by our office during regular business hours and pick up a GoodRx coupon card.  - If you need your prescription sent electronically to a different pharmacy, notify our office through [facility] or by phone at [phone] option 4.   Seborrheic Keratosis  What causes seborrheic keratoses? Seborrheic keratoses are harmless, common skin growths that first appear during adult life.  As time goes by, more growths appear.  Some people may develop a large number of them.  Seborrheic keratoses appear on both covered and uncovered body parts.  They are not caused by sunlight.  The tendency to develop seborrheic keratoses can be inherited.  They vary in color from skin-colored to  gray, brown, or even black.  They can be either smooth or have a rough, warty surface.   Seborrheic keratoses are superficial and look as if they were stuck on the skin.  Under the microscope this type of keratosis looks like layers upon layers of skin.  That is why at times the top layer may seem to fall off, but the rest of the growth remains and re-grows.    Treatment Seborrheic keratoses do not need to be treated, but can easily be removed in the office.  Seborrheic  keratoses often cause symptoms when they rub on clothing or jewelry.  Lesions can be in the way of shaving.  If they become inflamed, they can cause itching, soreness, or burning.  Removal of a seborrheic keratosis can be accomplished by freezing, burning, or surgery. If any spot bleeds, scabs, or grows rapidly, please return to have it checked, as these can be an indication of a skin cancer.

## 2021-05-15 NOTE — Progress Notes (Signed)
   New Patient Visit  Subjective  Susan Summers is a 80 y.o. female who presents for the following: Total body skin exam.  No problems or concerns today.  No h/o skin cancer, but sister has h/o SCCs   The following portions of the chart were reviewed this encounter and updated as appropriate:        Review of Systems:  No other skin or systemic complaints except as noted in HPI or Assessment and Plan.  Objective  Well appearing patient in no apparent distress; mood and affect are within normal limits.  A full examination was performed including scalp, head, eyes, ears, nose, lips, neck, chest, axillae, abdomen, back, buttocks, bilateral upper extremities, bilateral lower extremities, hands, feet, fingers, toes, fingernails, and toenails. All findings within normal limits unless otherwise noted below.  bil legs telangiectasias  R pretibia, R upper knee Waxy pink macules  R malar cheek  R malar cheek 5.42mm pink tan firm pap   Assessment & Plan   Lentigines - Scattered tan macules - Due to sun exposure - Benign-appering, observe - Recommend daily broad spectrum sunscreen SPF 30+ to sun-exposed areas, reapply every 2 hours as needed. - Call for any changes  Seborrheic Keratoses - Stuck-on, waxy, tan-brown papules and/or plaques  - Benign-appearing - Discussed benign etiology and prognosis. - Observe - Call for any changes  Melanocytic Nevi - Tan-brown and/or pink-flesh-colored symmetric macules and papules - Benign appearing on exam today - Observation - Call clinic for new or changing moles - Recommend daily use of broad spectrum spf 30+ sunscreen to sun-exposed areas.   Hemangiomas - Red papules - Discussed benign nature - Observe - Call for any changes  Actinic Damage - Chronic condition, secondary to cumulative UV/sun exposure - diffuse scaly erythematous macules with underlying dyspigmentation - Recommend daily broad spectrum sunscreen SPF 30+ to  sun-exposed areas, reapply every 2 hours as needed.  - Staying in the shade or wearing long sleeves, sun glasses (UVA+UVB protection) and wide brim hats (4-inch brim around the entire circumference of the hat) are also recommended for sun protection.  - Call for new or changing lesions.  Skin cancer screening performed today.  Spider veins bil legs  Benign, observe  Inflamed seborrheic keratosis R pretibia, R upper knee  Start OTC HC cream qd/bid until improved  Nevus R malar cheek  Recurrent Nevus, benign appearing, observe  Pt advised was removed years ago in [place] and was benign and has slowly regrown.  Return in about 1 year (around 05/15/2022) for TBSE.  I, Othelia Pulling, RMA, am acting as scribe for Brendolyn Patty, MD .  Documentation: I have reviewed the above documentation for accuracy and completeness, and I agree with the above.  Brendolyn Patty MD

## 2021-06-27 ENCOUNTER — Other Ambulatory Visit: Payer: Self-pay

## 2021-06-27 ENCOUNTER — Ambulatory Visit
Admission: RE | Admit: 2021-06-27 | Discharge: 2021-06-27 | Disposition: A | Discharge status: Home / Self Care | Payer: Medicare Other | Source: Ambulatory Visit | Attending: Family Medicine | Admitting: Family Medicine

## 2021-06-27 DIAGNOSIS — Z780 Asymptomatic menopausal state: Secondary | ICD-10-CM

## 2021-06-27 DIAGNOSIS — Z1231 Encounter for screening mammogram for malignant neoplasm of breast: Secondary | ICD-10-CM

## 2021-09-13 ENCOUNTER — Encounter: Payer: Self-pay | Admitting: Family Medicine

## 2021-09-13 ENCOUNTER — Ambulatory Visit (INDEPENDENT_AMBULATORY_CARE_PROVIDER_SITE_OTHER): Payer: Medicare Other | Admitting: Family Medicine

## 2021-09-13 ENCOUNTER — Other Ambulatory Visit: Payer: Self-pay

## 2021-09-13 VITALS — BP 138/66 | HR 71 | Temp 97.7°F | Resp 16 | Ht 62.0 in | Wt 186.5 lb

## 2021-09-13 DIAGNOSIS — R7301 Impaired fasting glucose: Secondary | ICD-10-CM

## 2021-09-13 DIAGNOSIS — E669 Obesity, unspecified: Secondary | ICD-10-CM | POA: Diagnosis not present

## 2021-09-13 DIAGNOSIS — R7303 Prediabetes: Secondary | ICD-10-CM

## 2021-09-13 DIAGNOSIS — Z5181 Encounter for therapeutic drug level monitoring: Secondary | ICD-10-CM

## 2021-09-13 DIAGNOSIS — G5603 Carpal tunnel syndrome, bilateral upper limbs: Secondary | ICD-10-CM

## 2021-09-13 DIAGNOSIS — E782 Mixed hyperlipidemia: Secondary | ICD-10-CM

## 2021-09-13 DIAGNOSIS — E66811 Obesity, class 1: Secondary | ICD-10-CM

## 2021-09-13 DIAGNOSIS — I10 Essential (primary) hypertension: Secondary | ICD-10-CM | POA: Diagnosis not present

## 2021-09-13 NOTE — Progress Notes (Signed)
Name: Susan Summers   MRN: [id number]    DOB: 1941-09-01   Date:09/13/2021       Progress Note  Chief Complaint  Patient presents with   Follow-up   Hyperlipidemia   Hypertension     Subjective:   Susan Summers is a 80 y.o. female, presents to clinic for routine f/up  HLD well controlled on crestor 20 mg - eating more veggies for cholesterol, not eating much meat No exertional sx or CP, denies claudication type sx  Labs reviewed Lab Results  Component Value Date   CHOL 130 03/13/2021   HDL 36 (L) 03/13/2021   LDLCALC 68 03/13/2021   TRIG 182 (H) 03/13/2021   CHOLHDL 3.6 03/13/2021    Hypertension:  Currently managed on micardis and she stopped HCTZ daily when last appt BP was well controlled and reported to be even lower at home - she is only taking it PRN Pt reports good med compliance and denies any SE.   Blood pressure today is near goal- higher than last OV controlled. BP Readings from Last 3 Encounters:  09/13/21 138/66  03/13/21 118/68  12/28/20 122/76   Pt denies CP, SOB, exertional sx, LE edema, palpitation, Ha's, visual disturbances, lightheadedness, hypotension, syncope.   Rash to bilateral arms when she touches red dye yarn - breaking out to forearms She previously had problem with BP med with with red dye - wondering what to do for it - she's been avoiding  Bilateral wrist pain - she saw specialists, is doing braces, pain is still frequent and bothersome, she is worried about the procedure and how she will recover etc, taking mobic prn, not daily    Current Outpatient Medications:    Cholecalciferol (VITAMIN D PO), Take 4,000 Units by mouth daily. , Disp: , Rfl:    ELDERBERRY PO, Take 1,250 mg by mouth daily. , Disp: , Rfl:    Garlic 132 MG TABS, Take by mouth daily., Disp: , Rfl:    hydrochlorothiazide (MICROZIDE) 12.5 MG capsule, Take 1 capsule (12.5 mg total) by mouth daily., Disp: 90 capsule, Rfl: 3   meloxicam (MOBIC) 15 MG tablet, Take 1 tablet  (15 mg total) by mouth daily. PRN, Disp: 30 tablet, Rfl: 5   Probiotic Product (PROBIOTIC DAILY PO), Take by mouth daily. , Disp: , Rfl:    rosuvastatin (CRESTOR) 20 MG tablet, Take 1 tablet (20 mg total) by mouth at bedtime., Disp: 90 tablet, Rfl: 3   telmisartan (MICARDIS) 80 MG tablet, Take 1 tablet (80 mg total) by mouth daily., Disp: 90 tablet, Rfl: 3  Patient Active Problem List   Diagnosis Date Noted   Bilateral carpal tunnel syndrome 09/20/2020   Obesity (BMI 30.0-34.9) 08/07/2018   Cramp in muscle 02/04/2018   H/O malignant neoplasm of skin 02/04/2018   Osteopenia of the elderly 02/04/2018   Post menopausal syndrome 02/04/2018   Chronic lower back pain 02/04/2018   Prediabetes 11/13/2016   Benign hypertension 04/25/2015   Hyperlipidemia 04/25/2015   Plantar fasciitis 04/25/2015    Past Surgical History:  Procedure Laterality Date   ABDOMINAL HYSTERECTOMY     APPENDECTOMY     BREAST BIOPSY Left 10/20/2018   [place] bx, path pending   HERNIA REPAIR     oopherrectomy  [id number]   UMBILICAL HERNIA REPAIR  2012    Family History  Problem Relation Age of Onset   Hypertension Mother    Stroke Mother    Rheum arthritis Mother    Diabetes  Father    Cancer Sister        Ovarian   Diabetes Sister    Hypertension Brother    Melanoma Daughter    Hypertension Son    Hyperlipidemia Son    Stroke Brother    Cancer Brother        Prostate   Other Brother        bowel obstruction   Hyperlipidemia Sister    Cancer Sister        skin- SCC removed    Social History   Tobacco Use   Smoking status: Never   Smokeless tobacco: Never   Tobacco comments:    smoking cessation materials not required  Vaping Use   Vaping Use: Never used  Substance Use Topics   Alcohol use: No    Alcohol/week: 0.0 standard drinks   Drug use: No     Allergies  Allergen Reactions   Nickel    Penicillins     Health Maintenance  Topic Date Due   Zoster Vaccines- Shingrix (1 of 2) Never done    COVID-19 Vaccine (5 - Booster for Pfizer series) 04/05/2021   MAMMOGRAM  06/27/2022   DEXA SCAN  06/28/2023   TETANUS/TDAP  08/12/2026   Pneumonia Vaccine 68+ Years old  Completed   INFLUENZA VACCINE  Completed   Hepatitis C Screening  Completed   HPV VACCINES  Aged Out    Chart Review Today: I personally reviewed active problem list, medication list, allergies, family history, social history, health maintenance, notes from last encounter, lab results, imaging with the patient/caregiver today.   Review of Systems  Constitutional: Negative.   HENT: Negative.    Eyes: Negative.   Respiratory: Negative.    Cardiovascular: Negative.   Gastrointestinal: Negative.   Endocrine: Negative.   Genitourinary: Negative.   Musculoskeletal: Negative.   Skin: Negative.   Allergic/Immunologic: Negative.   Neurological: Negative.   Hematological: Negative.   Psychiatric/Behavioral: Negative.    All other systems reviewed and are negative.   Objective:   Vitals:   09/13/21 0942  BP: 138/66  Pulse: 71  Resp: 16  Temp: 97.7 F (36.5 C)  TempSrc: Oral  SpO2: 94%  Weight: 186 lb 8 oz (84.6 kg)  Height: 5\' 2"  (1.575 m)    Body mass index is 34.11 kg/m.  Physical Exam Vitals and nursing note reviewed.  Constitutional:      General: She is not in acute distress.    Appearance: Normal appearance. She is well-developed. She is obese. She is not ill-appearing, toxic-appearing or diaphoretic.     Interventions: Face mask in place.  HENT:     Head: Normocephalic and atraumatic.     Right Ear: External ear normal.     Left Ear: External ear normal.  Eyes:     General: Lids are normal. No scleral icterus.       Right eye: No discharge.        Left eye: No discharge.     Conjunctiva/sclera: Conjunctivae normal.  Neck:     Trachea: Phonation normal. No tracheal deviation.  Cardiovascular:     Rate and Rhythm: Normal rate and regular rhythm.     Pulses: Normal pulses.           Radial pulses are 2+ on the right side and 2+ on the left side.       Posterior tibial pulses are 2+ on the right side and 2+ on the left side.  Heart sounds: Normal heart sounds. No murmur heard.   No friction rub. No gallop.  Pulmonary:     Effort: Pulmonary effort is normal. No respiratory distress.     Breath sounds: Normal breath sounds. No stridor. No wheezing, rhonchi or rales.  Chest:     Chest wall: No tenderness.  Abdominal:     General: Bowel sounds are normal. There is no distension.     Palpations: Abdomen is soft.  Musculoskeletal:     Right lower leg: No edema.     Left lower leg: No edema.  Skin:    General: Skin is warm and dry.     Coloration: Skin is not jaundiced or pale.     Findings: No rash.  Neurological:     Mental Status: She is alert.     Motor: No abnormal muscle tone.     Gait: Gait normal.  Psychiatric:        Mood and Affect: Mood normal.        Speech: Speech normal.        Behavior: Behavior normal.        Assessment & Plan:     ICD-10-CM   1. Benign hypertension  A16 COMPLETE METABOLIC PANEL WITH GFR   BP a little higher today, no lows at home, highs of 144, recommended resuming HCTZ daily and monitoring BP gaol 100/60-130/80    2. Mixed hyperlipidemia  E78.2    stable, well controlled, last labs LDL at goal, tolerating statin, continue med and diet/lifestyle     3. Obesity (BMI 30.0-34.9)  P53.7 COMPLETE METABOLIC PANEL WITH GFR   weight stable, encouraged healthy habits, walking    4. Prediabetes  R73.03    has been stable, last labs reviewed    5. Bilateral carpal tunnel syndrome  G56.03    on mobic prn, using brace, follows with Dr. Sabra Heck - encouraged her to call office to get more info about surgery/recovery etc    6. Medication monitoring encounter  S82.70 COMPLETE METABOLIC PANEL WITH GFR     Reviewed her bone density and mammogram with her today Improved bone density - normal T score, can do repeat dexa in >2 years -  likely 3-4 years okay, recommended continued preventative measures (Vit D/calcium supplements, weight bearing activity etc)  6 month f/up - will do lipids and A1C at next appt   Delsa Grana, PA-C 09/13/21 10:01 AM

## 2021-09-13 NOTE — Patient Instructions (Addendum)
Restart HCTZ/hydrochlorithiazide daily   BP goal is 100/60-130/80  Continue to use mobic as needed  When you develop rash and itching take a benadryl and you can start a 24 hour antihistamine like claritin or zyrtec and add pepcid 20 mg daily to help calm down your histamine response.  You can you topical hydrocortisone cream to affected areas  You need an appointment if it continues to spread of you have lip/face/eye swelling or trouble breathing.  Contact Dermatitis Dermatitis is redness, soreness, and swelling (inflammation) of the skin. Contact dermatitis is a reaction to certain substances that touch the skin. Many different substances can cause contact dermatitis. There are two types of contact dermatitis: Irritant contact dermatitis. This type is caused by something that irritates your skin, such as having dry hands from washing them too often with soap. This type does not require previous exposure to the substance for a reaction to occur. This is the most common type. Allergic contact dermatitis. This type is caused by a substance that you are allergic to, such as poison ivy. This type occurs when you have been exposed to the substance (allergen) and develop a sensitivity to it. Dermatitis may develop soon after your first exposure to the allergen, or it may not develop until the next time you are exposed and every time thereafter. What are the causes? Irritant contact dermatitis is most commonly caused by exposure to: Makeup. Soaps. Detergents. Bleaches. Acids. Metal salts, such as nickel. Allergic contact dermatitis is most commonly caused by exposure to: Poisonous plants. Chemicals. Jewelry. Latex. Medicines. Preservatives in products, such as clothing. What increases the risk? You are more likely to develop this condition if you have: A job that exposes you to irritants or allergens. Certain medical conditions, such as asthma or eczema. What are the signs or  symptoms? Symptoms of this condition may occur on your body anywhere the irritant has touched you or is touched by you. Symptoms include: Dryness or flaking. Redness. Cracks. Itching. Pain or a burning feeling. Blisters. Drainage of small amounts of blood or clear fluid from skin cracks. With allergic contact dermatitis, there may also be swelling in areas such as the eyelids, mouth, or genitals. How is this diagnosed? This condition is diagnosed with a medical history and physical exam. A patch skin test may be performed to help determine the cause. If the condition is related to your job, you may need to see an occupational medicine specialist. How is this treated? This condition is treated by checking for the cause of the reaction and protecting your skin from further contact. Treatment may also include: Steroid creams or ointments. Oral steroid medicines may be needed in more severe cases. Antibiotic medicines or antibacterial ointments, if a skin infection is present. Antihistamine lotion or an antihistamine taken by mouth to ease itching. A bandage (dressing). Follow these instructions at home: Skin care Moisturize your skin as needed. Apply cool compresses to the affected areas. Try applying baking soda paste to your skin. Stir water into baking soda until it reaches a paste-like consistency. Do not scratch your skin, and avoid friction to the affected area. Avoid the use of soaps, perfumes, and dyes. Medicines Take or apply over-the-counter and prescription medicines only as told by your health care provider. If you were prescribed an antibiotic medicine, take or apply the antibiotic as told by your health care provider. Do not stop using the antibiotic even if your condition improves. Bathing Try taking a bath with: Epsom salts. Follow  the instructions on the packaging. You can get these at your local pharmacy or grocery store. Baking soda. Pour a small amount into the bath  as directed by your health care provider. Colloidal oatmeal. Follow the instructions on the packaging. You can get this at your local pharmacy or grocery store. Bathe less frequently, such as every other day. Bathe in lukewarm water. Avoid using hot water. Bandage care If you were given a bandage (dressing), change it as told by your health care provider. Wash your hands with soap and water before and after you change your dressing. If soap and water are not available, use hand sanitizer. General instructions Avoid the substance that caused your reaction. If you do not know what caused it, keep a journal to try to track what caused it. Write down: What you eat. What cosmetic products you use. What you drink. What you wear in the affected area. This includes jewelry. Check the affected areas every day for signs of infection. Check for: More redness, swelling, or pain. More fluid or blood. Warmth. Pus or a bad smell. Keep all follow-up visits as told by your health care provider. This is important. Contact a health care provider if: Your condition does not improve with treatment. Your condition gets worse. You have signs of infection such as swelling, tenderness, redness, soreness, or warmth in the affected area. You have a fever. You have new symptoms. Get help right away if: You have a severe headache, neck pain, or neck stiffness. You vomit. You feel very sleepy. You notice red streaks coming from the affected area. Your bone or joint underneath the affected area becomes painful after the skin has healed. The affected area turns darker. You have difficulty breathing. Summary Dermatitis is redness, soreness, and swelling (inflammation) of the skin. Contact dermatitis is a reaction to certain substances that touch the skin. Symptoms of this condition may occur on your body anywhere the irritant has touched you or is touched by you. This condition is treated by figuring out what  caused the reaction and protecting your skin from further contact. Treatment may also include medicines and skin care. Avoid the substance that caused your reaction. If you do not know what caused it, keep a journal to try to track what caused it. Contact a health care provider if your condition gets worse or you have signs of infection such as swelling, tenderness, redness, soreness, or warmth in the affected area. This information is not intended to replace advice given to you by your health care provider. Make sure you discuss any questions you have with your health care provider. Document Revised: 02/17/2019 Document Reviewed: 05/13/2018 Elsevier Patient Education  [place].

## 2021-09-14 ENCOUNTER — Encounter: Payer: Self-pay | Admitting: Family Medicine

## 2021-09-14 LAB — COMPLETE METABOLIC PANEL WITH GFR
AG Ratio: 2 (calc) (ref 1.0–2.5)
ALT: 19 U/L (ref 6–29)
AST: 19 U/L (ref 10–35)
Albumin: 4.7 g/dL (ref 3.6–5.1)
Alkaline phosphatase (APISO): 94 U/L (ref 37–153)
BUN: 17 mg/dL (ref 7–25)
CO2: 31 mmol/L (ref 20–32)
Calcium: 9.8 mg/dL (ref 8.6–10.4)
Chloride: 100 mmol/L (ref 98–110)
Creat: 0.88 mg/dL (ref 0.60–1.00)
Globulin: 2.3 g/dL (calc) (ref 1.9–3.7)
Glucose, Bld: 107 mg/dL — ABNORMAL HIGH (ref 65–99)
Potassium: 4.2 mmol/L (ref 3.5–5.3)
Sodium: 139 mmol/L (ref 135–146)
Total Bilirubin: 1.3 mg/dL — ABNORMAL HIGH (ref 0.2–1.2)
Total Protein: 7 g/dL (ref 6.1–8.1)
eGFR: 67 mL/min/{1.73_m2} (ref >=60)

## 2022-01-01 ENCOUNTER — Ambulatory Visit (INDEPENDENT_AMBULATORY_CARE_PROVIDER_SITE_OTHER): Payer: Medicare Other

## 2022-01-01 DIAGNOSIS — Z0000 Encounter for general adult medical examination without abnormal findings: Secondary | ICD-10-CM

## 2022-01-01 NOTE — Progress Notes (Signed)
Subjective:   Susan Summers is a 81 y.o. female who presents for Medicare Annual (Subsequent) preventive examination.  Virtual Visit via Telephone Note  I connected with  Susan Summers on 01/01/22 at 10:00 AM EST by telephone and verified that I am speaking with the correct person using two identifiers.  Location: Patient: home Provider: [place] Persons participating in the virtual visit: [place]   I discussed the limitations, risks, security and privacy concerns of performing an evaluation and management service by telephone and the availability of in person appointments. The patient expressed understanding and agreed to proceed.  Interactive audio and video telecommunications were attempted between this nurse and patient, however failed, due to patient having technical difficulties OR patient did not have access to video capability.  We continued and completed visit with audio only.  Some vital signs may be absent or patient reported.   Clemetine Marker, LPN   Review of Systems     Cardiac Risk Factors include: advanced age (>25men, >61 women);dyslipidemia;hypertension;obesity (BMI >30kg/m2)     Objective:    There were no vitals filed for this visit. There is no height or weight on file to calculate BMI.  Advanced Directives 01/01/2022 12/28/2020 12/28/2019 12/24/2018 12/19/2017 08/07/2017 03/12/2017  Does Patient Have a Medical Advance Directive? Yes Yes No No Yes Yes Yes  Type of Paramedic of [place];Living will [place];Living will - - [place];Living will - Living will  Does patient want to make changes to medical advance directive? - - - - - - -  Copy of [place] in Chart? Yes - validated most recent copy scanned in chart (See row information) Yes - validated most recent copy scanned in chart (See row information) - - No - copy requested - -  Would patient like information on  creating a medical advance directive? - - No - Patient declined No - Patient declined - - -    Current Medications (verified) Outpatient Encounter Medications as of 01/01/2022  Medication Sig   Cholecalciferol (VITAMIN D PO) Take 4,000 Units by mouth daily.    ELDERBERRY PO Take 1,250 mg by mouth daily.    Garlic 563 MG TABS Take by mouth daily.   hydrochlorothiazide (MICROZIDE) 12.5 MG capsule Take 1 capsule (12.5 mg total) by mouth daily.   meloxicam (MOBIC) 15 MG tablet Take 1 tablet (15 mg total) by mouth daily. PRN   Probiotic Product (PROBIOTIC DAILY PO) Take by mouth daily.    rosuvastatin (CRESTOR) 20 MG tablet Take 1 tablet (20 mg total) by mouth at bedtime.   telmisartan (MICARDIS) 80 MG tablet Take 1 tablet (80 mg total) by mouth daily.   No facility-administered encounter medications on file as of 01/01/2022.    Allergies (verified) Nickel, Penicillins, and Red dye   History: Past Medical History:  Diagnosis Date   At risk for falling 02/04/2018   Carpal tunnel syndrome    Herpes zona 02/04/2018   T8 right   Hyperlipidemia    Hypertension    Osteopenia    Prediabetes    Psoriasis 02/04/2018   elbow   Swelling of both ankles 08/09/2015   Past Surgical History:  Procedure Laterality Date   ABDOMINAL HYSTERECTOMY     APPENDECTOMY     BREAST BIOPSY Left 10/20/2018   [place] bx, path pending   HERNIA REPAIR     oopherrectomy  1497   UMBILICAL HERNIA REPAIR  2012  Family History  Problem Relation Age of Onset   Hypertension Mother    Stroke Mother    Rheum arthritis Mother    Diabetes Father    Cancer Sister        Ovarian   Diabetes Sister    Hypertension Brother    Melanoma Daughter    Hypertension Son    Hyperlipidemia Son    Stroke Brother    Cancer Brother        Prostate   Other Brother        bowel obstruction   Hyperlipidemia Sister    Cancer Sister        skin- SCC removed   Social History   Socioeconomic History   Marital status: Widowed     Spouse name: Zenia Resides   Number of children: 2   Years of education: Not on file   Highest education level: 12th grade  Occupational History   Occupation: Retired  Tobacco Use   Smoking status: Never   Smokeless tobacco: Never   Tobacco comments:    smoking cessation materials not required  Vaping Use   Vaping Use: Never used  Substance and Sexual Activity   Alcohol use: No    Alcohol/week: 0.0 standard drinks   Drug use: No   Sexual activity: Not Currently  Other Topics Concern   Not on file  Social History Narrative   Pt lives alone   Social Determinants of Health   Financial Resource Strain: Low Risk    Difficulty of Paying Living Expenses: Not hard at all  Food Insecurity: No Food Insecurity   Worried About Charity fundraiser in the Last Year: Never true   Arboriculturist in the Last Year: Never true  Transportation Needs: No Transportation Needs   Lack of Transportation (Medical): No   Lack of Transportation (Non-Medical): No  Physical Activity: Sufficiently Active   Days of Exercise per Week: 5 days   Minutes of Exercise per Session: 40 min  Stress: No Stress Concern Present   Feeling of Stress : Not at all  Social Connections: Moderately Integrated   Frequency of Communication with Friends and Family: More than three times a week   Frequency of Social Gatherings with Friends and Family: Three times a week   Attends Religious Services: More than 4 times per year   Active Member of Clubs or Organizations: Yes   Attends Archivist Meetings: More than 4 times per year   Marital Status: Widowed    Tobacco Counseling Counseling given: Not Answered Tobacco comments: smoking cessation materials not required   Clinical Intake:  Pre-visit preparation completed: Yes  Pain : No/denies pain     Nutritional Risks: None Diabetes: No  How often do you need to have someone help you when you read instructions, pamphlets, or other written materials from your  doctor or pharmacy?: 1 - Never    Interpreter Needed?: No  Information entered by :: Clemetine Marker LPN   Activities of Daily Living In your present state of health, do you have any difficulty performing the following activities: 01/01/2022 09/13/2021  Hearing? N N  Vision? N N  Difficulty concentrating or making decisions? N N  Walking or climbing stairs? N N  Dressing or bathing? N N  Doing errands, shopping? N N  Preparing Food and eating ? N -  Using the Toilet? N -  In the past six months, have you accidently leaked urine? Y -  Comment wears  pads for protection -  Do you have problems with loss of bowel control? N -  Managing your Medications? N -  Managing your Finances? N -  Housekeeping or managing your Housekeeping? N -  Some recent data might be hidden    Patient Care Team: Delsa Grana, PA-C as PCP - General (Family Medicine) Earnestine Leys, MD (Orthopedic Surgery) Leandrew Koyanagi, MD as Referring Physician (Ophthalmology) Brendolyn Patty, MD (Dermatology)  Indicate any recent Medical Services you may have received from other than [facility] providers in the past year (date may be approximate).     Assessment:   This is a routine wellness examination for [place].  Hearing/Vision screen Hearing Screening - Comments:: Pt denies hearing difficulty Vision Screening - Comments:: [facility] screen[facility]  Dietary issues and exercise activities discussed: Current Exercise Habits: Home exercise routine, Type of exercise: walking, Time (Minutes): 40, Frequency (Times/Week): 5, Weekly Exercise (Minutes/Week): 200, Intensity: Moderate, Exercise limited by: None identified   Goals Addressed             This Visit's Progress    DIET - INCREASE WATER INTAKE   On track    Recommend to drink at least 6-8 8oz glasses of water per day.     Exercise    On track    Starting 12/16/16, I will start exercising 3 days week for 30 minutes.        Depression  Screen PHQ 2/9 Scores 01/01/2022 09/13/2021 03/13/2021 12/28/2020 09/12/2020 02/28/2020 12/28/2019  PHQ - 2 Score 0 0 0 0 0 0 0  PHQ- 9 Score - 0 0 - - 0 -    Fall Risk Fall Risk  01/01/2022 09/13/2021 03/13/2021 12/28/2020 09/12/2020  Falls in the past year? 0 0 0 0 0  Comment - - - - -  Number falls in past yr: 0 0 0 0 0  Injury with Fall? 0 0 0 0 0  Risk for fall due to : No Fall Risks No Fall Risks - No Fall Risks -  Risk for fall due to: Comment - - - - -  Follow up Falls prevention discussed Falls prevention discussed - Falls prevention discussed Falls evaluation completed    FALL RISK PREVENTION PERTAINING TO THE HOME:  Any stairs in or around the home? Yes  If so, are there any without handrails? No  Home free of loose throw rugs in walkways, pet beds, electrical cords, etc? Yes  Adequate lighting in your home to reduce risk of falls? Yes   ASSISTIVE DEVICES UTILIZED TO PREVENT FALLS:  Life alert? No  Use of a cane, walker or w/c? No  Grab bars in the bathroom? Yes  Shower chair or bench in shower? No  Elevated toilet seat or a handicapped toilet? Yes   TIMED UP AND GO:  Was the test performed? No . Telephonic visit.   Cognitive Function: Normal cognitive status assessed by direct observation by this Nurse Health Advisor. No abnormalities found.       6CIT Screen 12/28/2019 12/24/2018 12/19/2017 12/16/2016  What Year? 0 points 0 points 0 points 0 points  What month? 0 points 0 points 0 points 0 points  What time? 0 points 0 points 0 points 0 points  Count back from 20 0 points 0 points 0 points 0 points  Months in reverse 0 points 0 points 0 points 0 points  Repeat phrase 0 points 0 points 2 points 0 points  Total Score 0 0 2  0    Immunizations Immunization History  Administered Date(s) Administered   Fluad Quad(high Dose 65+) 07/21/2019   Influenza, High Dose Seasonal PF 08/07/2017, 08/07/2018   Influenza,inj,quad, With Preservative 08/11/2020   Influenza-Unspecified  07/12/2014, 08/02/2015, 08/12/2016, 08/07/2017, 08/07/2018, 08/23/2020, 07/26/2021   PFIZER(Purple Top)SARS-COV-2 Vaccination 11/16/2019, 12/07/2019, 08/08/2020, 02/08/2021   Pneumococcal Conjugate-13 07/26/2014   Pneumococcal Polysaccharide-23 12/16/2016   Td 11/12/2003   Tdap 08/12/2016    TDAP status: Up to date  Flu Vaccine status: Up to date  Pneumococcal vaccine status: Up to date  Covid-19 vaccine status: Completed vaccines  Qualifies for Shingles Vaccine? Yes   Zostavax completed No   Shingrix Completed?: No.    Education has been provided regarding the importance of this vaccine. Patient has been advised to call insurance company to determine out of pocket expense if they have not yet received this vaccine. Advised may also receive vaccine at local pharmacy or Health Dept. Verbalized acceptance and understanding.  Screening Tests Health Maintenance  Topic Date Due   Zoster Vaccines- Shingrix (1 of 2) Never done   COVID-19 Vaccine (5 - Booster for Pfizer series) 04/05/2021   MAMMOGRAM  06/27/2022   DEXA SCAN  06/27/2024   TETANUS/TDAP  08/12/2026   Pneumonia Vaccine 70+ Years old  Completed   INFLUENZA VACCINE  Completed   Hepatitis C Screening  Completed   HPV VACCINES  Aged Out   Fecal DNA (Cologuard)  Discontinued    Health Maintenance  Health Maintenance Due  Topic Date Due   Zoster Vaccines- Shingrix (1 of 2) Never done   COVID-19 Vaccine (5 - Booster for Pfizer series) 04/05/2021    Colorectal cancer screening: No longer required.   Mammogram status: Completed 06/27/21. Repeat every year  Bone Density status: Completed 06/27/21. Results reflect: Bone density results: NORMAL. Repeat every 3 years.  Lung Cancer Screening: (Low Dose CT Chest recommended if Age 47-80 years, 30 pack-year currently smoking OR have quit w/in 15years.) does not qualify.   Additional Screening:  Hepatitis C Screening: does qualify; Completed 09/12/20  Vision Screening:  Recommended annual ophthalmology exams for early detection of glaucoma and other disorders of the eye. Is the patient up to date with their annual eye exam?  Yes  Who is the provider or what is the name of the office in which the patient attends annual eye exams? [facility]   Dental Screening: Recommended annual dental exams for proper oral hygiene  Community Resource Referral / Chronic Care Management: CRR required this visit?  No   CCM required this visit?  No      Plan:     I have personally reviewed and noted the following in the patients chart:   Medical and social history Use of alcohol, tobacco or illicit drugs  Current medications and supplements including opioid prescriptions.  Functional ability and status Nutritional status Physical activity Advanced directives List of other physicians Hospitalizations, surgeries, and ER visits in previous 12 months Vitals Screenings to include cognitive, depression, and falls Referrals and appointments  In addition, I have reviewed and discussed with patient certain preventive protocols, quality metrics, and best practice recommendations. A written personalized care plan for preventive services as well as general preventive health recommendations were provided to patient.     Clemetine Marker, LPN   06/20/1750   Nurse Notes: none

## 2022-01-01 NOTE — Patient Instructions (Signed)
[name] , Thank you for taking time to come for your Medicare Wellness Visit. I appreciate your ongoing commitment to your health goals. Please review the following plan we discussed and let me know if I can assist you in the future.   Screening recommendations/referrals: Colonoscopy: no longer required Mammogram: done 06/27/21 Bone Density: done 06/27/21 Recommended yearly ophthalmology/optometry visit for glaucoma screening and checkup Recommended yearly dental visit for hygiene and checkup  Vaccinations: Influenza vaccine: done 07/26/21 Pneumococcal vaccine: done 12/16/16 Tdap vaccine: done 08/12/16 Shingles vaccine: Shingrix discussed. Please contact your pharmacy for coverage information.  Covid-19: done 11/16/19, 12/07/19, 08/08/20 & 02/08/21  Conditions/risks identified: Keep up the great work!  Next appointment: Follow up in one year for your annual wellness visit    Preventive Care 65 Years and Older, Female Preventive care refers to lifestyle choices and visits with your health care provider that can promote health and wellness. What does preventive care include? A yearly physical exam. This is also called an annual well check. Dental exams once or twice a year. Routine eye exams. Ask your health care provider how often you should have your eyes checked. Personal lifestyle choices, including: Daily care of your teeth and gums. Regular physical activity. Eating a healthy diet. Avoiding tobacco and drug use. Limiting alcohol use. Practicing safe sex. Taking low-dose aspirin every day. Taking vitamin and mineral supplements as recommended by your health care provider. What happens during an annual well check? The services and screenings done by your health care provider during your annual well check will depend on your age, overall health, lifestyle risk factors, and family history of disease. Counseling  Your health care provider may ask you questions about your: Alcohol  use. Tobacco use. Drug use. Emotional well-being. Home and relationship well-being. Sexual activity. Eating habits. History of falls. Memory and ability to understand (cognition). Work and work Statistician. Reproductive health. Screening  You may have the following tests or measurements: Height, weight, and BMI. Blood pressure. Lipid and cholesterol levels. These may be checked every 5 years, or more frequently if you are over 4 years old. Skin check. Lung cancer screening. You may have this screening every year starting at age 68 if you have a 30-pack-year history of smoking and currently smoke or have quit within the past 15 years. Fecal occult blood test (FOBT) of the stool. You may have this test every year starting at age 46. Flexible sigmoidoscopy or colonoscopy. You may have a sigmoidoscopy every 5 years or a colonoscopy every 10 years starting at age 65. Hepatitis C blood test. Hepatitis B blood test. Sexually transmitted disease (STD) testing. Diabetes screening. This is done by checking your blood sugar (glucose) after you have not eaten for a while (fasting). You may have this done every 1-3 years. Bone density scan. This is done to screen for osteoporosis. You may have this done starting at age 50. Mammogram. This may be done every 1-2 years. Talk to your health care provider about how often you should have regular mammograms. Talk with your health care provider about your test results, treatment options, and if necessary, the need for more tests. Vaccines  Your health care provider may recommend certain vaccines, such as: Influenza vaccine. This is recommended every year. Tetanus, diphtheria, and acellular pertussis (Tdap, Td) vaccine. You may need a Td booster every 10 years. Zoster vaccine. You may need this after age 75. Pneumococcal 13-valent conjugate (PCV13) vaccine. One dose is recommended after age 66. Pneumococcal polysaccharide (PPSV23)  vaccine. One dose is  recommended after age 10. Talk to your health care provider about which screenings and vaccines you need and how often you need them. This information is not intended to replace advice given to you by your health care provider. Make sure you discuss any questions you have with your health care provider. Document Released: 11/24/2015 Document Revised: 07/17/2016 Document Reviewed: 08/29/2015 Elsevier Interactive Patient Education  2017 [place] Prevention in the Home Falls can cause injuries. They can happen to people of all ages. There are many things you can do to make your home safe and to help prevent falls. What can I do on the outside of my home? Regularly fix the edges of walkways and driveways and fix any cracks. Remove anything that might make you trip as you walk through a door, such as a raised step or threshold. Trim any bushes or trees on the path to your home. Use bright outdoor lighting. Clear any walking paths of anything that might make someone trip, such as rocks or tools. Regularly check to see if handrails are loose or broken. Make sure that both sides of any steps have handrails. Any raised decks and porches should have guardrails on the edges. Have any leaves, snow, or ice cleared regularly. Use sand or salt on walking paths during winter. Clean up any spills in your garage right away. This includes oil or grease spills. What can I do in the bathroom? Use night lights. Install grab bars by the toilet and in the tub and shower. Do not use towel bars as grab bars. Use non-skid mats or decals in the tub or shower. If you need to sit down in the shower, use a plastic, non-slip stool. Keep the floor dry. Clean up any water that spills on the floor as soon as it happens. Remove soap buildup in the tub or shower regularly. Attach bath mats securely with double-sided non-slip rug tape. Do not have throw rugs and other things on the floor that can make you  trip. What can I do in the bedroom? Use night lights. Make sure that you have a light by your bed that is easy to reach. Do not use any sheets or blankets that are too big for your bed. They should not hang down onto the floor. Have a firm chair that has side arms. You can use this for support while you get dressed. Do not have throw rugs and other things on the floor that can make you trip. What can I do in the kitchen? Clean up any spills right away. Avoid walking on wet floors. Keep items that you use a lot in easy-to-reach places. If you need to reach something above you, use a strong step stool that has a grab bar. Keep electrical cords out of the way. Do not use floor polish or wax that makes floors slippery. If you must use wax, use non-skid floor wax. Do not have throw rugs and other things on the floor that can make you trip. What can I do with my stairs? Do not leave any items on the stairs. Make sure that there are handrails on both sides of the stairs and use them. Fix handrails that are broken or loose. Make sure that handrails are as long as the stairways. Check any carpeting to make sure that it is firmly attached to the stairs. Fix any carpet that is loose or worn. Avoid having throw rugs at the top or bottom  of the stairs. If you do have throw rugs, attach them to the floor with carpet tape. Make sure that you have a light switch at the top of the stairs and the bottom of the stairs. If you do not have them, ask someone to add them for you. What else can I do to help prevent falls? Wear shoes that: Do not have high heels. Have rubber bottoms. Are comfortable and fit you well. Are closed at the toe. Do not wear sandals. If you use a stepladder: Make sure that it is fully opened. Do not climb a closed stepladder. Make sure that both sides of the stepladder are locked into place. Ask someone to hold it for you, if possible. Clearly mark and make sure that you can  see: Any grab bars or handrails. First and last steps. Where the edge of each step is. Use tools that help you move around (mobility aids) if they are needed. These include: Canes. Walkers. Scooters. Crutches. Turn on the lights when you go into a dark area. Replace any light bulbs as soon as they burn out. Set up your furniture so you have a clear path. Avoid moving your furniture around. If any of your floors are uneven, fix them. If there are any pets around you, be aware of where they are. Review your medicines with your doctor. Some medicines can make you feel dizzy. This can increase your chance of falling. Ask your doctor what other things that you can do to help prevent falls. This information is not intended to replace advice given to you by your health care provider. Make sure you discuss any questions you have with your health care provider. Document Released: 08/24/2009 Document Revised: 04/04/2016 Document Reviewed: 12/02/2014 Elsevier Interactive Patient Education  2017 [employer].

## 2022-03-14 ENCOUNTER — Ambulatory Visit (INDEPENDENT_AMBULATORY_CARE_PROVIDER_SITE_OTHER): Payer: Medicare Other | Admitting: Family Medicine

## 2022-03-14 ENCOUNTER — Encounter: Payer: Self-pay | Admitting: Family Medicine

## 2022-03-14 VITALS — BP 128/70 | HR 87 | Resp 16 | Ht 62.0 in | Wt 189.0 lb

## 2022-03-14 DIAGNOSIS — I10 Essential (primary) hypertension: Secondary | ICD-10-CM

## 2022-03-14 DIAGNOSIS — Z6834 Body mass index (BMI) 34.0-34.9, adult: Secondary | ICD-10-CM

## 2022-03-14 DIAGNOSIS — E669 Obesity, unspecified: Secondary | ICD-10-CM

## 2022-03-14 DIAGNOSIS — Z5181 Encounter for therapeutic drug level monitoring: Secondary | ICD-10-CM | POA: Diagnosis not present

## 2022-03-14 DIAGNOSIS — Z87898 Personal history of other specified conditions: Secondary | ICD-10-CM | POA: Insufficient documentation

## 2022-03-14 DIAGNOSIS — E782 Mixed hyperlipidemia: Secondary | ICD-10-CM

## 2022-03-14 DIAGNOSIS — G5603 Carpal tunnel syndrome, bilateral upper limbs: Secondary | ICD-10-CM

## 2022-03-14 MED ORDER — ROSUVASTATIN CALCIUM 20 MG PO TABS: 20.0000 mg | ORAL_TABLET | Freq: Every day | ORAL | 3 refills | Status: DC

## 2022-03-14 MED ORDER — HYDROCHLOROTHIAZIDE 12.5 MG PO CAPS: 12.5000 mg | ORAL_CAPSULE | Freq: Every day | ORAL | 3 refills | Status: DC

## 2022-03-14 MED ORDER — MELOXICAM 15 MG PO TABS: 7.5000 mg | ORAL_TABLET | Freq: Every day | ORAL | 2 refills | Status: DC

## 2022-03-14 MED ORDER — TELMISARTAN 80 MG PO TABS: 80.0000 mg | ORAL_TABLET | Freq: Every day | ORAL | 3 refills | Status: DC

## 2022-03-14 NOTE — Progress Notes (Signed)
? ?Name: Susan Summers   MRN: [id number]    DOB: 01/13/1941   Date:03/14/2022 ? ?     Progress Note ? ?Chief Complaint  ?Patient presents with  ? Follow-up  ? ? ? ?Subjective:  ? ?Susan Summers is a 81 y.o. female, presents to clinic for routine f/up and med refill ? ?Hypertension:  ?Currently managed on HCTZ and micardis ?Pt reports good med compliance and denies any SE.   ?Blood pressure today is well controlled. ?BP Readings from Last 3 Encounters:  ?03/14/22 128/70  ?09/13/21 138/66  ?03/13/21 118/68  ? ?Pt denies CP, SOB, exertional sx, LE edema, palpitation, Ha's, visual disturbances, lightheadedness, hypotension, syncope. ?Dietary efforts for BP?  See below ? ?Hyperlipidemia: ?Currently treated with crestor 20, pt reports good med compliance ?Last Lipids: ?Lab Results  ?Component Value Date  ? CHOL 130 03/13/2021  ? HDL 36 (L) 03/13/2021  ? [place] 68 03/13/2021  ? TRIG 182 (H) 03/13/2021  ? CHOLHDL 3.6 03/13/2021  ? ?- Denies: Chest pain, shortness of breath, myalgias, claudication ? ?Hx of prediabetes many years ago, she often has glucose just over 100, but last several years A1C has been 5.6 ? ?Was using mobic for her b/l carpal tunnel sx - she hasn't used it in a while and started a otc supplement and still using braces ? ? ? ? ?Current Outpatient Medications:  ?  Cholecalciferol (VITAMIN D PO), Take 4,000 Units by mouth daily. , Disp: , Rfl:  ?  diphenhydrAMINE HCl (NERVINE PO), Take by mouth., Disp: , Rfl:  ?  ELDERBERRY PO, Take 1,250 mg by mouth daily. , Disp: , Rfl:  ?  Garlic 151 MG TABS, Take by mouth daily., Disp: , Rfl:  ?  hydrochlorothiazide (MICROZIDE) 12.5 MG capsule, Take 1 capsule (12.5 mg total) by mouth daily., Disp: 90 capsule, Rfl: 3 ?  meloxicam (MOBIC) 15 MG tablet, Take 1 tablet (15 mg total) by mouth daily. PRN, Disp: 30 tablet, Rfl: 5 ?  Probiotic Product (PROBIOTIC DAILY PO), Take by mouth daily. , Disp: , Rfl:  ?  rosuvastatin (CRESTOR) 20 MG tablet, Take 1 tablet (20 mg total) by  mouth at bedtime., Disp: 90 tablet, Rfl: 3 ?  telmisartan (MICARDIS) 80 MG tablet, Take 1 tablet (80 mg total) by mouth daily., Disp: 90 tablet, Rfl: 3 ? ?Patient Active Problem List  ? Diagnosis Date Noted  ? Bilateral carpal tunnel syndrome 09/20/2020  ? Obesity (BMI 30.0-34.9) 08/07/2018  ? Cramp in muscle 02/04/2018  ? H/O malignant neoplasm of skin 02/04/2018  ? Osteopenia of the elderly 02/04/2018  ? Post menopausal syndrome 02/04/2018  ? Chronic lower back pain 02/04/2018  ? Prediabetes 11/13/2016  ? Benign hypertension 04/25/2015  ? Hyperlipidemia 04/25/2015  ? Plantar fasciitis 04/25/2015  ? ? ?Past Surgical History:  ?Procedure Laterality Date  ? ABDOMINAL HYSTERECTOMY    ? APPENDECTOMY    ? BREAST BIOPSY Left 10/20/2018  ? [place] bx, path pending  ? HERNIA REPAIR    ? oopherrectomy  2012  ? UMBILICAL HERNIA REPAIR  2012  ? ? ?Family History  ?Problem Relation Age of Onset  ? Hypertension Mother   ? Stroke Mother   ? Rheum arthritis Mother   ? Diabetes Father   ? Cancer Sister   ?     Ovarian  ? Diabetes Sister   ? Hypertension Brother   ? Melanoma Daughter   ? Hypertension Son   ? Hyperlipidemia Son   ? Stroke Brother   ?  Cancer Brother   ?     Prostate  ? Other Brother   ?     bowel obstruction  ? Hyperlipidemia Sister   ? Cancer Sister   ?     skin- SCC removed  ? ? ?Social History  ? ?Tobacco Use  ? Smoking status: Never  ? Smokeless tobacco: Never  ? Tobacco comments:  ?  smoking cessation materials not required  ?Vaping Use  ? Vaping Use: Never used  ?Substance Use Topics  ? Alcohol use: No  ?  Alcohol/week: 0.0 standard drinks  ? Drug use: No  ?  ? ?Allergies  ?Allergen Reactions  ? Nickel   ? Penicillins   ? Red Dye   ?  Prior allergy to med with red dye in it, contact dermatitis with yarn   ? ? ?Health Maintenance  ?Topic Date Due  ? Zoster Vaccines- Shingrix (1 of 2) Never done  ? COVID-19 Vaccine (5 - Booster for [place] series) 04/05/2021  ? INFLUENZA VACCINE  06/11/2022  ? MAMMOGRAM  06/27/2022  ?  DEXA SCAN  06/27/2024  ? TETANUS/TDAP  08/12/2026  ? Pneumonia Vaccine 69+ Years old  Completed  ? Hepatitis C Screening  Completed  ? HPV VACCINES  Aged Out  ? Fecal DNA (Cologuard)  Discontinued  ?She got one shingles shot done, is due for the other- not in chart ? ?Chart Review Today: ?I personally reviewed active problem list, medication list, allergies, family history, social history, health maintenance, notes from last encounter, lab results, imaging with the patient/caregiver today. ? ? ?Review of Systems  ?Constitutional: Negative.   ?HENT: Negative.    ?Eyes: Negative.   ?Respiratory: Negative.    ?Cardiovascular: Negative.   ?Gastrointestinal: Negative.   ?Endocrine: Negative.   ?Genitourinary: Negative.   ?Musculoskeletal: Negative.   ?Skin: Negative.   ?Allergic/Immunologic: Negative.   ?Neurological: Negative.   ?Hematological: Negative.   ?Psychiatric/Behavioral: Negative.    ?All other systems reviewed and are negative.  ? ?Objective:  ? ?Vitals:  ? 03/14/22 0843  ?BP: 128/70  ?Pulse: 87  ?Resp: 16  ?SpO2: 98%  ?Weight: 189 lb (85.7 kg)  ?Height: '5\' 2"'$  (1.575 m)  ? ? ?Body mass index is 34.57 kg/m?. ? ?Physical Exam ?Vitals and nursing note reviewed.  ?Constitutional:   ?   General: She is not in acute distress. ?   Appearance: Normal appearance. She is well-developed and well-groomed. She is obese. She is not ill-appearing, toxic-appearing or diaphoretic.  ?HENT:  ?   Head: Normocephalic and atraumatic.  ?   Right Ear: External ear normal.  ?   Left Ear: External ear normal.  ?Eyes:  ?   General: Lids are normal. No scleral icterus.    ?   Right eye: No discharge.     ?   Left eye: No discharge.  ?   Conjunctiva/sclera: Conjunctivae normal.  ?Neck:  ?   Trachea: Phonation normal. No tracheal deviation.  ?Cardiovascular:  ?   Rate and Rhythm: Normal rate and regular rhythm.  ?   Pulses: Normal pulses.     ?     Radial pulses are 2+ on the right side and 2+ on the left side.  ?     Posterior tibial  pulses are 2+ on the right side and 2+ on the left side.  ?   Heart sounds: Normal heart sounds. No murmur heard. ?  No friction rub. No gallop.  ?Pulmonary:  ?   Effort:  Pulmonary effort is normal. No respiratory distress.  ?   Breath sounds: Normal breath sounds. No stridor. No wheezing, rhonchi or rales.  ?Chest:  ?   Chest wall: No tenderness.  ?Abdominal:  ?   General: Bowel sounds are normal. There is no distension.  ?   Palpations: Abdomen is soft.  ?Musculoskeletal:  ?   Right lower leg: No edema.  ?   Left lower leg: No edema.  ?Skin: ?   General: Skin is warm and dry.  ?   Coloration: Skin is not jaundiced or pale.  ?   Findings: No rash.  ?Neurological:  ?   Mental Status: She is alert.  ?   Motor: No abnormal muscle tone.  ?   Gait: Gait normal.  ?Psychiatric:     ?   Mood and Affect: Mood normal.     ?   Speech: Speech normal.     ?   Behavior: Behavior normal. Behavior is cooperative.  ?  ? ? ? ? ?Assessment & Plan:  ? ?1. Benign hypertension ?Well controlled, compliant with meds, no SE or concerning sx ?Recheck renal function and electrolytes ?Pt encouraged to continue to work on healthy diet (low salt) and lifestyle for improving HTN management - DASH diet handout offered today ?- telmisartan (MICARDIS) 80 MG tablet; Take 1 tablet (80 mg total) by mouth daily.  Dispense: 90 tablet; Refill: 3 ?- hydrochlorothiazide (MICROZIDE) 12.5 MG capsule; Take 1 capsule (12.5 mg total) by mouth daily.  Dispense: 90 capsule; Refill: 3 ?- COMPLETE METABOLIC PANEL WITH GFR ? ?2. Mixed hyperlipidemia ?Compliant with meds, no SE, no myalgias, fatigue or jaundice ?Due for FLP and recheck CMP ?She is working on diet and exercise, cutting out sweets ?- rosuvastatin (CRESTOR) 20 MG tablet; Take 1 tablet (20 mg total) by mouth at bedtime.  Dispense: 90 tablet; Refill: 3 ?- COMPLETE METABOLIC PANEL WITH GFR ?- Lipid panel ? ?3. Bilateral carpal tunnel syndrome ?Sx current managed well with bracing, OTC supplement, she has  not taken mobic in a while - pharmacist refused to fill it - discussed and reviewed MOA, SE and concerns with other NSAIDs, reviewed her BP and renal function, refilled with lower dose and encouraged her

## 2022-03-15 LAB — COMPLETE METABOLIC PANEL WITH GFR
AG Ratio: 1.8 (calc) (ref 1.0–2.5)
ALT: 22 U/L (ref 6–29)
AST: 22 U/L (ref 10–35)
Albumin: 4.4 g/dL (ref 3.6–5.1)
Alkaline phosphatase (APISO): 88 U/L (ref 37–153)
BUN: 17 mg/dL (ref 7–25)
CO2: 30 mmol/L (ref 20–32)
Calcium: 9.4 mg/dL (ref 8.6–10.4)
Chloride: 101 mmol/L (ref 98–110)
Creat: 0.86 mg/dL (ref 0.60–1.00)
Globulin: 2.5 g/dL (calc) (ref 1.9–3.7)
Glucose, Bld: 114 mg/dL — ABNORMAL HIGH (ref 65–99)
Potassium: 4.2 mmol/L (ref 3.5–5.3)
Sodium: 140 mmol/L (ref 135–146)
Total Bilirubin: 1.1 mg/dL (ref 0.2–1.2)
Total Protein: 6.9 g/dL (ref 6.1–8.1)
eGFR: 69 mL/min/{1.73_m2} (ref >=60)

## 2022-03-15 LAB — LIPID PANEL
Cholesterol: 134 mg/dL (ref <200)
HDL: 31 mg/dL — ABNORMAL LOW (ref >=50)
LDL Cholesterol (Calc): 73 mg/dL (calc)
Non-HDL Cholesterol (Calc): 103 mg/dL (calc) (ref <130)
Total CHOL/HDL Ratio: 4.3 (calc) (ref <5.0)
Triglycerides: 206 mg/dL — ABNORMAL HIGH (ref <150)

## 2022-03-15 LAB — CBC WITH DIFFERENTIAL/PLATELET
Absolute Monocytes: 531 cells/uL (ref 200–950)
Basophils Absolute: 43 cells/uL (ref 0–200)
Basophils Relative: 0.7 %
Eosinophils Absolute: 159 cells/uL (ref 15–500)
Eosinophils Relative: 2.6 %
HCT: 40.1 % (ref 35.0–45.0)
Hemoglobin: 13.4 g/dL (ref 11.7–15.5)
Lymphs Abs: 1263 cells/uL (ref 850–3900)
MCH: 29.2 pg (ref 27.0–33.0)
MCHC: 33.4 g/dL (ref 32.0–36.0)
MCV: 87.4 fL (ref 80.0–100.0)
MPV: 11.4 fL (ref 7.5–12.5)
Monocytes Relative: 8.7 %
Neutro Abs: 4105 cells/uL (ref 1500–7800)
Neutrophils Relative %: 67.3 %
Platelets: 183 10*3/uL (ref 140–400)
RBC: 4.59 10*6/uL (ref 3.80–5.10)
RDW: 13.1 % (ref 11.0–15.0)
Total Lymphocyte: 20.7 %
WBC: 6.1 10*3/uL (ref 3.8–10.8)

## 2022-03-15 LAB — HEMOGLOBIN A1C
Hgb A1c MFr Bld: 5.7 % of total Hgb — ABNORMAL HIGH (ref <5.7)
Mean Plasma Glucose: 117 mg/dL
eAG (mmol/L): 6.5 mmol/L

## 2022-06-04 ENCOUNTER — Ambulatory Visit: Payer: Medicare Other | Admitting: Dermatology

## 2022-06-04 DIAGNOSIS — L814 Other melanin hyperpigmentation: Secondary | ICD-10-CM

## 2022-06-04 DIAGNOSIS — L719 Rosacea, unspecified: Secondary | ICD-10-CM | POA: Diagnosis not present

## 2022-06-04 DIAGNOSIS — Z1283 Encounter for screening for malignant neoplasm of skin: Secondary | ICD-10-CM | POA: Diagnosis not present

## 2022-06-04 DIAGNOSIS — L578 Other skin changes due to chronic exposure to nonionizing radiation: Secondary | ICD-10-CM | POA: Diagnosis not present

## 2022-06-04 DIAGNOSIS — L820 Inflamed seborrheic keratosis: Secondary | ICD-10-CM

## 2022-06-04 DIAGNOSIS — D1801 Hemangioma of skin and subcutaneous tissue: Secondary | ICD-10-CM

## 2022-06-04 DIAGNOSIS — L821 Other seborrheic keratosis: Secondary | ICD-10-CM

## 2022-06-04 DIAGNOSIS — I8393 Asymptomatic varicose veins of bilateral lower extremities: Secondary | ICD-10-CM

## 2022-06-04 DIAGNOSIS — D229 Melanocytic nevi, unspecified: Secondary | ICD-10-CM

## 2022-06-04 MED ORDER — METRONIDAZOLE 0.75 % EX CREA
TOPICAL_CREAM | CUTANEOUS | 5 refills | Status: DC
Start: 2022-06-04 — End: 2023-04-16

## 2022-06-04 NOTE — Patient Instructions (Addendum)
Cryotherapy Aftercare  Wash gently with soap and water everyday.   Apply Vaseline and Band-Aid daily until healed.    Seborrheic Keratosis  What causes seborrheic keratoses? Seborrheic keratoses are harmless, common skin growths that first appear during adult life.  As time goes by, more growths appear.  Some people may develop a large number of them.  Seborrheic keratoses appear on both covered and uncovered body parts.  They are not caused by sunlight.  The tendency to develop seborrheic keratoses can be inherited.  They vary in color from skin-colored to gray, brown, or even black.  They can be either smooth or have a rough, warty surface.   Seborrheic keratoses are superficial and look as if they were stuck on the skin.  Under the microscope this type of keratosis looks like layers upon layers of skin.  That is why at times the top layer may seem to fall off, but the rest of the growth remains and re-grows.    Treatment Seborrheic keratoses do not need to be treated, but can easily be removed in the office.  Seborrheic keratoses often cause symptoms when they rub on clothing or jewelry.  Lesions can be in the way of shaving.  If they become inflamed, they can cause itching, soreness, or burning.  Removal of a seborrheic keratosis can be accomplished by freezing, burning, or surgery. If any spot bleeds, scabs, or grows rapidly, please return to have it checked, as these can be an indication of a skin cancer.   Rosacea  What is rosacea? Rosacea (say: ro-zay-sha) is a common skin disease that usually begins as a trend of flushing or blushing easily.  As rosacea progresses, a persistent redness in the center of the face will develop and may gradually spread beyond the nose and cheeks to the forehead and chin.  In some cases, the ears, chest, and back could be affected.  Rosacea may appear as tiny blood vessels or small red bumps that occur in crops.  Frequently they can contain pus, and are  called "pustules".  If the bumps do not contain pus, they are referred to as "papules".  Rarely, in prolonged, untreated cases of rosacea, the oil glands of the nose and cheeks may become permanently enlarged.  This is called rhinophyma, and is seen more frequently in men.  Signs and Risks In its beginning stages, rosacea tends to come and go, which makes it difficult to recognize.  It can start as intermittent flushing of the face.  Eventually, blood vessels may become permanently visible.  Pustules and papules can appear, but can be mistaken for adult acne.  People of all races, ages, genders and ethnic groups are at risk of developing rosacea.  However, it is more common in women (especially around menopause) and adults with fair skin between the ages of 56 and 28.  Treatment Dermatologists typically recommend a combination of treatments to effectively manage rosacea.  Treatment can improve symptoms and may stop the progression of the rosacea.  Treatment may involve both topical and oral medications.  The tetracycline antibiotics are often used for their anti-inflammatory effect; however, because of the possibility of developing antibiotic resistance, they should not be used long term at full dose.  For dilated blood vessels the options include electrodessication (uses electric current through a small needle), laser treatment, and cosmetics to hide the redness.   With all forms of treatment, improvement is a slow process, and patients may not see any results for the first  3-4 weeks.  It is very important to avoid the sun and other triggers.  Patients must wear sunscreen daily.  Skin Care Instructions: Cleanse the skin with a mild soap such as CeraVe cleanser, Cetaphil cleanser, or Dove soap once or twice daily as needed. Moisturize with Eucerin Redness Relief Daily Perfecting Lotion (has a subtle green tint), CeraVe Moisturizing Cream, or Oil of Olay Daily Moisturizer with sunscreen every morning  and/or night as recommended. Makeup should be "non-comedogenic" (won't clog pores) and be labeled "for sensitive skin". Good choices for cosmetics are: Neutrogena, Almay, and Physician's Formula.  Any product with a green tint tends to offset a red complexion. If your eyes are dry and irritated, use artificial tears 2-3 times per day and cleanse the eyelids daily with baby shampoo.  Have your eyes examined at least every 2 years.  Be sure to tell your eye doctor that you have rosacea. Alcoholic beverages tend to cause flushing of the skin, and may make rosacea worse. Always wear sunscreen, protect your skin from extreme hot and cold temperatures, and avoid spicy foods, hot drinks, and mechanical irritation such as rubbing, scrubbing, or massaging the face.  Avoid harsh skin cleansers, cleansing masks, astringents, and exfoliation. If a particular product burns or makes your face feel tight, then it is likely to flare your rosacea. If you are having difficulty finding a sunscreen that you can tolerate, you may try switching to a chemical-free sunscreen.  These are ones whose active ingredient is zinc oxide or titanium dioxide only.  They should also be fragrance free, non-comedogenic, and labeled for sensitive skin. Rosacea triggers may vary from person to person.  There are a variety of foods that have been reported to trigger rosacea.  Some patients find that keeping a diary of what they were doing when they flared helps them avoid triggers.   Due to recent changes in healthcare laws, you may see results of your pathology and/or laboratory studies on MyChart before the doctors have had a chance to review them. We understand that in some cases there may be results that are confusing or concerning to you. Please understand that not all results are received at the same time and often the doctors may need to interpret multiple results in order to provide you with the best plan of care or course of treatment.  Therefore, we ask that you please give [place] 2 business days to thoroughly review all your results before contacting the office for clarification. Should we see a critical lab result, you will be contacted sooner.   If You Need Anything After Your Visit  If you have any questions or concerns for your doctor, please call our main line at [phone] and press option 4 to reach your doctor's medical assistant. If no one answers, please leave a voicemail as directed and we will return your call as soon as possible. Messages left after 4 pm will be answered the following business day.   You may also send [place] a message via [place]. We typically respond to MyChart messages within 1-2 business days.  For prescription refills, please ask your pharmacy to contact our office. Our fax number is [phone].  If you have an urgent issue when the clinic is closed that cannot wait until the next business day, you can page your doctor at the number below.    Please note that while we do our best to be available for urgent issues outside of office hours, we are not available  24/7.   If you have an urgent issue and are unable to reach [place], you may choose to seek medical care at your doctor's office, retail clinic, urgent care center, or emergency room.  If you have a medical emergency, please immediately call 911 or go to the emergency department.  Pager Numbers  - Dr. Nehemiah Massed: [phone]  - Dr. Laurence Ferrari: [phone]  - Dr. Nicole Kindred: [phone]  In the event of inclement weather, please call our main line at [phone] for an update on the status of any delays or closures.  Dermatology Medication Tips: Please keep the boxes that topical medications come in in order to help keep track of the instructions about where and how to use these. Pharmacies typically print the medication instructions only on the boxes and not directly on the medication tubes.   If your medication is too expensive, please  contact our office at [phone] option 4 or send [place] a message through [place].   We are unable to tell what your co-pay for medications will be in advance as this is different depending on your insurance coverage. However, we may be able to find a substitute medication at lower cost or fill out paperwork to get insurance to cover a needed medication.   If a prior authorization is required to get your medication covered by your insurance company, please allow [place] 1-2 business days to complete this process.  Drug prices often vary depending on where the prescription is filled and some pharmacies may offer cheaper prices.  The website www.goodrx.com contains coupons for medications through different pharmacies. The prices here do not account for what the cost may be with help from insurance (it may be cheaper with your insurance), but the website can give you the price if you did not use any insurance.  - You can print the associated coupon and take it with your prescription to the pharmacy.  - You may also stop by our office during regular business hours and pick up a GoodRx coupon card.  - If you need your prescription sent electronically to a different pharmacy, notify our office through [facility] or by phone at [phone] option 4.     Si Usted Necesita Algo Despus de Su Visita  Tambin puede enviarnos un mensaje a travs de Pharmacist, community. Por lo general respondemos a los mensajes de MyChart en el transcurso de 1 a 2 das hbiles.  Para renovar recetas, por favor pida a su farmacia que se ponga en contacto con nuestra oficina. Harland Dingwall de fax es [place] [phone].  Si tiene un asunto urgente cuando la clnica est cerrada y que no puede esperar hasta el siguiente da hbil, puede llamar/localizar a su doctor(a) al nmero que aparece a continuacin.   Por favor, tenga en cuenta que aunque hacemos todo lo posible para estar disponibles para asuntos urgentes fuera del horario de  [place], no estamos disponibles las 24 horas del da, los 7 das de la [place].   Si tiene un problema urgente y no puede comunicarse con nosotros, puede optar por buscar atencin mdica  en el consultorio de su doctor(a), en una clnica privada, en un centro de atencin urgente o en una sala de emergencias.  Si tiene Engineering geologist, por favor llame inmediatamente al 911 o vaya a la sala de emergencias.  Nmeros de bper  - Dr. Nehemiah Massed: [phone]  - Dra. Moye: [phone]  - Dra. Nicole Kindred: [phone]  En caso de inclemencias del tiempo, por favor llame a  Johnsie Kindred principal al [phone] para una actualizacin sobre el [place] de cualquier retraso o cierre.  Consejos para la medicacin en dermatologa: Por favor, guarde las cajas en las que vienen los medicamentos de uso tpico para ayudarle a seguir las instrucciones sobre dnde y cmo usarlos. Las farmacias generalmente imprimen las instrucciones del medicamento slo en las cajas y no directamente en los tubos del [place].   Si su medicamento es muy caro, por favor, pngase en contacto con Zigmund Daniel llamando al [phone] y presione la opcin 4 o envenos un mensaje a travs de Pharmacist, community.   No podemos decirle cul ser su copago por los medicamentos por adelantado ya que esto es diferente dependiendo de la cobertura de su seguro. Sin embargo, es posible que podamos encontrar un medicamento sustituto a Electrical engineer un formulario para que el seguro cubra el medicamento que se considera necesario.   Si se requiere una autorizacin previa para que su compaa de seguros [place] su medicamento, por favor permtanos de 1 a 2 das hbiles para completar este proceso.  Los precios de los medicamentos varan con frecuencia dependiendo del Environmental consultant de dnde se surte la receta y alguna farmacias pueden ofrecer precios ms baratos.  El sitio web www.goodrx.com tiene cupones para medicamentos de Airline pilot. Los  precios aqu no tienen en cuenta lo que podra costar con la ayuda del seguro (puede ser ms barato con su seguro), pero el sitio web puede darle el precio si no utiliz Research scientist (physical sciences).  - Puede imprimir el cupn correspondiente y llevarlo con su receta a la farmacia.  - Tambin puede pasar por nuestra oficina durante el horario de atencin regular y Charity fundraiser una tarjeta de cupones de GoodRx.  - Si necesita que su receta se enve electrnicamente a una farmacia diferente, informe a nuestra oficina a travs de MyChart de [place] o por telfono llamando al [phone] y presione la opcin 4.

## 2022-06-04 NOTE — Progress Notes (Signed)
Follow-Up Visit   Subjective  Susan Summers is a 81 y.o. female who presents for the following: Annual Exam.  The patient presents for Total-Body Skin Exam (TBSE) for skin cancer screening and mole check.  The patient has spots, moles and lesions to be evaluated, some may be new or changing. No history of skin cancer. She has rough spots on her arms, chest, back, face that she picks at.    The following portions of the chart were reviewed this encounter and updated as appropriate:       Review of Systems:  No other skin or systemic complaints except as noted in HPI or Assessment and Plan.  Objective  Well appearing patient in no apparent distress; mood and affect are within normal limits.  A full examination was performed including scalp, head, eyes, ears, nose, lips, neck, chest, axillae, abdomen, back, buttocks, bilateral upper extremities, bilateral lower extremities, hands, feet, fingers, toes, fingernails, and toenails. All findings within normal limits unless otherwise noted below.  R forearm x 3, L forearm x 1, L med upper breast x 1, L mid back x 5, L forehead x 1 (11) Erythematous stuck-on, waxy papule or plaque  R upper elbow 5.0 mm firm red papule  face Erythema and pink papules on the cheeks and nose     Assessment & Plan  Skin cancer screening performed today.  Actinic Damage - chronic, secondary to cumulative UV radiation exposure/sun exposure over time - diffuse scaly erythematous macules with underlying dyspigmentation - Recommend daily broad spectrum sunscreen SPF 30+ to sun-exposed areas, reapply every 2 hours as needed.  - Recommend staying in the shade or wearing long sleeves, sun glasses (UVA+UVB protection) and wide brim hats (4-inch brim around the entire circumference of the hat). - Call for new or changing lesions.  Lentigines - Scattered tan macules - Due to sun exposure - Benign-appering, observe - Recommend daily broad spectrum sunscreen SPF  30+ to sun-exposed areas, reapply every 2 hours as needed. - Call for any changes  Seborrheic Keratoses - Stuck-on, waxy, tan-brown papules and/or plaques, including left inframammary 1.0 cm  - Benign-appearing - Discussed benign etiology and prognosis. - Observe - Call for any changes  Melanocytic Nevi - Tan-brown and/or pink-flesh-colored symmetric macules and papules - Benign appearing on exam today - Observation - Call clinic for new or changing moles - Recommend daily use of broad spectrum spf 30+ sunscreen to sun-exposed areas.   Hemangiomas - Red papules - Discussed benign nature - Observe - Call for any changes  Varicose Veins/Spider Veins - Dilated blue, purple or red veins at the lower extremities - Reassured - Smaller vessels can be treated by sclerotherapy (a procedure to inject a medicine into the veins to make them disappear) if desired, but the treatment is not covered by insurance. Larger vessels may be covered if symptomatic and we would refer to vascular surgeon if treatment desired.  Inflamed seborrheic keratosis (11) R forearm x 3, L forearm x 1, L med upper breast x 1, L mid back x 5, L forehead x 1  Symptomatic, irritating, patient would like treated.  Destruction of lesion - R forearm x 3, L forearm x 1, L med upper breast x 1, L mid back x 5, L forehead x 1  Destruction method: cryotherapy   Informed consent: discussed and consent obtained   Lesion destroyed using liquid nitrogen: Yes   Region frozen until ice ball extended beyond lesion: Yes   Outcome: patient  tolerated procedure well with no complications   Post-procedure details: wound care instructions given   Additional details:  Prior to procedure, discussed risks of blister formation, small wound, skin dyspigmentation, or rare scar following cryotherapy. Recommend Vaseline ointment to treated areas while healing.   Hemangioma of skin R upper elbow  Benign, observe.     Rosacea face  Chronic and persistent condition with duration or expected duration over one year. Condition is bothersome/symptomatic for patient. Currently flared.   Rosacea is a chronic progressive skin condition usually affecting the face of adults, causing redness and/or acne bumps. It is treatable but not curable. It sometimes affects the eyes (ocular rosacea) as well. It may respond to topical and/or systemic medication and can flare with stress, sun exposure, alcohol, exercise and some foods.  Daily application of broad spectrum spf 30+ sunscreen to face is recommended to reduce flares.  Start metronidazole 0.75% cream Apply to mid face QHS dsp 45g 5Rf  metroNIDAZOLE (METROCREAM) 0.75 % cream - face Apply to mid face every night for Rosacea.   Return in about 1 year (around 06/05/2023) for TBSE.  IJamesetta Orleans, CMA, am acting as scribe for Brendolyn Patty, MD .  Documentation: I have reviewed the above documentation for accuracy and completeness, and I agree with the above.  Brendolyn Patty MD

## 2022-08-21 ENCOUNTER — Ambulatory Visit (INDEPENDENT_AMBULATORY_CARE_PROVIDER_SITE_OTHER): Payer: Medicare Other

## 2022-08-21 DIAGNOSIS — Z23 Encounter for immunization: Secondary | ICD-10-CM

## 2022-09-13 ENCOUNTER — Ambulatory Visit (INDEPENDENT_AMBULATORY_CARE_PROVIDER_SITE_OTHER): Payer: Medicare Other | Admitting: Family Medicine

## 2022-09-13 ENCOUNTER — Encounter: Payer: Self-pay | Admitting: Family Medicine

## 2022-09-13 VITALS — BP 138/76 | HR 90 | Temp 98.0°F | Resp 16 | Ht 62.0 in | Wt 191.0 lb

## 2022-09-13 DIAGNOSIS — Z1231 Encounter for screening mammogram for malignant neoplasm of breast: Secondary | ICD-10-CM

## 2022-09-13 DIAGNOSIS — E782 Mixed hyperlipidemia: Secondary | ICD-10-CM

## 2022-09-13 DIAGNOSIS — G5603 Carpal tunnel syndrome, bilateral upper limbs: Secondary | ICD-10-CM

## 2022-09-13 DIAGNOSIS — Z5181 Encounter for therapeutic drug level monitoring: Secondary | ICD-10-CM | POA: Diagnosis not present

## 2022-09-13 DIAGNOSIS — I10 Essential (primary) hypertension: Secondary | ICD-10-CM | POA: Diagnosis not present

## 2022-09-13 DIAGNOSIS — R7303 Prediabetes: Secondary | ICD-10-CM

## 2022-09-13 DIAGNOSIS — Z6834 Body mass index (BMI) 34.0-34.9, adult: Secondary | ICD-10-CM

## 2022-09-13 DIAGNOSIS — Z23 Encounter for immunization: Secondary | ICD-10-CM

## 2022-09-13 DIAGNOSIS — E669 Obesity, unspecified: Secondary | ICD-10-CM

## 2022-09-13 MED ORDER — MELOXICAM 15 MG PO TABS: 7.5000 mg | ORAL_TABLET | Freq: Every day | ORAL | 2 refills | Status: DC

## 2022-09-13 MED ORDER — ZOSTER VAC RECOMB ADJUVANTED 50 MCG/0.5ML IM SUSR: 0.5000 mL | Freq: Once | INTRAMUSCULAR | 1 refills | Status: AC

## 2022-09-13 NOTE — Assessment & Plan Note (Signed)
Last labs recent, good continued med compliance Monitor annually

## 2022-09-13 NOTE — Progress Notes (Signed)
Name: DAMIAN HOFSTRA   MRN: [id number]    DOB: May 27, 1941   Date:09/13/2022       Progress Note  Chief Complaint  Patient presents with   Follow-up   Hypertension   Hyperlipidemia   PreDM     Subjective:   Susan Summers is a 81 y.o. female, presents to clinic for routine f/up  HTN on micardis and HCTZ, good compliance, no SE or concerns BP Readings from Last 10 Encounters:  09/13/22 138/76  03/14/22 128/70  09/13/21 138/66  03/13/21 118/68  12/28/20 122/76  09/12/20 128/76  02/28/20 132/78  12/28/19 124/63  08/30/19 136/84  02/26/19 (!) 129/59   HLD on crestor, labs done recently, reviewed Lab Results  Component Value Date   CHOL 134 03/14/2022   HDL 31 (L) 03/14/2022   LDLCALC 73 03/14/2022   TRIG 206 (H) 03/14/2022   CHOLHDL 4.3 03/14/2022  LDL fairly well controlled  Prediabetes Pt notes increasing weight Lab Results  Component Value Date   HGBA1C 5.7 (H) 03/14/2022    Due for mammogram -  Bumped her chest and was sore she was waiting until it felt healed before she did mammo   Current Outpatient Medications:    Cholecalciferol (VITAMIN D PO), Take 4,000 Units by mouth daily. , Disp: , Rfl:    diphenhydrAMINE HCl (NERVINE PO), Take by mouth., Disp: , Rfl:    ELDERBERRY PO, Take 1,250 mg by mouth daily. , Disp: , Rfl:    Garlic 829 MG TABS, Take by mouth daily., Disp: , Rfl:    hydrochlorothiazide (MICROZIDE) 12.5 MG capsule, Take 1 capsule (12.5 mg total) by mouth daily., Disp: 90 capsule, Rfl: 3   meloxicam (MOBIC) 15 MG tablet, Take 0.5-1 tablets (7.5-15 mg total) by mouth daily. PRN, Disp: 30 tablet, Rfl: 2   metroNIDAZOLE (METROCREAM) 0.75 % cream, Apply to mid face every night for Rosacea., Disp: 45 g, Rfl: 5   Probiotic Product (PROBIOTIC DAILY PO), Take by mouth daily. , Disp: , Rfl:    rosuvastatin (CRESTOR) 20 MG tablet, Take 1 tablet (20 mg total) by mouth at bedtime., Disp: 90 tablet, Rfl: 3   telmisartan (MICARDIS) 80 MG tablet, Take 1  tablet (80 mg total) by mouth daily., Disp: 90 tablet, Rfl: 3  Patient Active Problem List   Diagnosis Date Noted   History of prediabetes 03/14/2022   Bilateral carpal tunnel syndrome 09/20/2020   Class 1 obesity with serious comorbidity and body mass index (BMI) of 34.0 to 34.9 in adult 08/07/2018   Cramp in muscle 02/04/2018   H/O malignant neoplasm of skin 02/04/2018   Osteopenia of the elderly 02/04/2018   Post menopausal syndrome 02/04/2018   Chronic lower back pain 02/04/2018   Prediabetes 11/13/2016   Benign hypertension 04/25/2015   Hyperlipidemia 04/25/2015   Plantar fasciitis 04/25/2015    Past Surgical History:  Procedure Laterality Date   ABDOMINAL HYSTERECTOMY     APPENDECTOMY     BREAST BIOPSY Left 10/20/2018   [place] bx, path pending   HERNIA REPAIR     oopherrectomy  [id number]   UMBILICAL HERNIA REPAIR  2012    Family History  Problem Relation Age of Onset   Hypertension Mother    Stroke Mother    Rheum arthritis Mother    Diabetes Father    Cancer Sister        Ovarian   Diabetes Sister    Hypertension Brother    Melanoma Daughter    Hypertension  Son    Hyperlipidemia Son    Stroke Brother    Cancer Brother        Prostate   Other Brother        bowel obstruction   Hyperlipidemia Sister    Cancer Sister        skin- SCC removed    Social History   Tobacco Use   Smoking status: Never   Smokeless tobacco: Never   Tobacco comments:    smoking cessation materials not required  Vaping Use   Vaping Use: Never used  Substance Use Topics   Alcohol use: No    Alcohol/week: 0.0 standard drinks of alcohol   Drug use: No     Allergies  Allergen Reactions   Nickel    Penicillins    Red Dye     Prior allergy to med with red dye in it, contact dermatitis with yarn     Health Maintenance  Topic Date Due   MAMMOGRAM  06/27/2022   COVID-19 Vaccine (5 - Pfizer risk series) 09/29/2022 (Originally 04/05/2021)   Zoster Vaccines- Shingrix (1 of 2)  12/14/2022 (Originally 04/04/1960)   Medicare Annual Wellness (AWV)  01/01/2023   DEXA SCAN  06/27/2024   TETANUS/TDAP  08/12/2026   Pneumonia Vaccine 71+ Years old  Completed   INFLUENZA VACCINE  Completed   Hepatitis C Screening  Completed   HPV VACCINES  Aged Out   Fecal DNA (Cologuard)  Discontinued    Chart Review Today: I personally reviewed active problem list, medication list, allergies, family history, social history, health maintenance, notes from last encounter, lab results, imaging with the patient/caregiver today.   Review of Systems  Constitutional: Negative.   HENT: Negative.    Eyes: Negative.   Respiratory: Negative.    Cardiovascular: Negative.   Gastrointestinal: Negative.   Endocrine: Negative.   Genitourinary: Negative.   Musculoskeletal: Negative.   Skin: Negative.   Allergic/Immunologic: Negative.   Neurological: Negative.   Hematological: Negative.   Psychiatric/Behavioral: Negative.    All other systems reviewed and are negative.    Objective:   Vitals:   09/13/22 0905  BP: 138/76  Pulse: 90  Resp: 16  Temp: 98 F (36.7 C)  TempSrc: Oral  SpO2: 94%  Weight: 191 lb (86.6 kg)  Height: '5\' 2"'$  (1.575 m)    Body mass index is 34.93 kg/m.  Physical Exam Vitals and nursing note reviewed.  Constitutional:      General: She is not in acute distress.    Appearance: Normal appearance. She is well-developed. She is not ill-appearing, toxic-appearing or diaphoretic.  HENT:     Head: Normocephalic and atraumatic.     Nose: Nose normal.  Eyes:     General:        Right eye: No discharge.        Left eye: No discharge.     Conjunctiva/sclera: Conjunctivae normal.  Neck:     Trachea: No tracheal deviation.  Cardiovascular:     Rate and Rhythm: Normal rate and regular rhythm.     Pulses: Normal pulses.     Heart sounds: Normal heart sounds.  Pulmonary:     Effort: Pulmonary effort is normal. No respiratory distress.     Breath sounds:  Normal breath sounds. No stridor.  Musculoskeletal:     Right lower leg: No edema.     Left lower leg: No edema.  Skin:    General: Skin is warm and dry.     Findings:  No rash.  Neurological:     Mental Status: She is alert. Mental status is at baseline.     Motor: No abnormal muscle tone.     Coordination: Coordination normal.  Psychiatric:        Mood and Affect: Mood normal.        Behavior: Behavior normal.         Assessment & Plan:   Problem List Items Addressed This Visit       Cardiovascular and Mediastinum   Benign hypertension - Primary (Chronic)    Fairly well controlled on current meds No change Encouraged diet/lifestyle efforts      Relevant Orders   COMPLETE METABOLIC PANEL WITH GFR     Nervous and Auditory   Bilateral carpal tunnel syndrome    Continue mobic as needed       Relevant Medications   meloxicam (MOBIC) 15 MG tablet     Other   Hyperlipidemia (Chronic)    Last labs recent, good continued med compliance Monitor annually       Relevant Orders   COMPLETE METABOLIC PANEL WITH GFR   Prediabetes (Chronic)    Increased weight, she has a sweet tooth Recheck A1C today      Relevant Orders   COMPLETE METABOLIC PANEL WITH GFR   Hemoglobin A1c   Class 1 obesity with serious comorbidity and body mass index (BMI) of 34.0 to 34.9 in adult    Associated comorbidities of HLD, HTN prediabetes      Other Visit Diagnoses     Medication monitoring encounter       Relevant Orders   COMPLETE METABOLIC PANEL WITH GFR   Hemoglobin A1c   Encounter for screening mammogram for malignant neoplasm of breast       Relevant Orders   MM 3D SCREEN BREAST BILATERAL   Need for shingles vaccine       Relevant Medications   Zoster Vaccine Adjuvanted [facility]) injection        Return in about 6 months (around 03/14/2023) for CPE (if insurance covers).   Delsa Grana, PA-C 09/13/22 9:16 AM

## 2022-09-13 NOTE — Assessment & Plan Note (Signed)
Increased weight, she has a sweet tooth Recheck A1C today

## 2022-09-13 NOTE — Assessment & Plan Note (Signed)
Associated comorbidities of HLD, HTN prediabetes

## 2022-09-13 NOTE — Assessment & Plan Note (Signed)
Continue mobic as needed

## 2022-09-13 NOTE — Assessment & Plan Note (Signed)
Fairly well controlled on current meds No change Encouraged diet/lifestyle efforts

## 2022-09-13 NOTE — Patient Instructions (Signed)
How to Take Your Blood Pressure Blood pressure measures how strongly your blood is pressing against the walls of your arteries. Arteries are blood vessels that carry blood from your heart throughout your body. You can take your blood pressure at home with a machine. You may need to check your blood pressure at home: To check if you have high blood pressure (hypertension). To check your blood pressure over time. To make sure your blood pressure medicine is working. Supplies needed: Blood pressure machine, or monitor. A chair to sit in. This should be a chair where you can sit upright with your back supported. Do not sit on a soft couch or an armchair. Table or desk. Small notebook. Pencil or pen. How to prepare Avoid these things for 30 minutes before checking your blood pressure: Having drinks with caffeine in them, such as coffee or tea. Drinking alcohol. Eating. Smoking. Exercising. Do these things five minutes before checking your blood pressure: Go to the bathroom and pee (urinate). Sit in a chair. Be quiet. Do not talk. How to take your blood pressure Follow the instructions that came with your machine. If you have a digital blood pressure monitor, these may be the instructions: Sit up straight. Place your feet on the floor. Do not cross your ankles or legs. Rest your left arm at the level of your heart. You may rest it on a table, desk, or chair. Pull up your shirt sleeve. Wrap the blood pressure cuff around the upper part of your left arm. The cuff should be 1 inch (2.5 cm) above your elbow. It is best to wrap the cuff around bare skin. Fit the cuff snugly around your arm, but not too tightly. You should be able to place only one finger between the cuff and your arm. Place the cord so that it rests in the bend of your elbow. Press the power button. Sit quietly while the cuff fills with air and loses air. Write down the numbers on the screen. Wait 2-3 minutes and then repeat  steps 1-10. What do the numbers mean? Two numbers make up your blood pressure. The first number is called systolic pressure. The second is called diastolic pressure. An example of a blood pressure reading is "120 over 80" (or 120/80). If you are an adult and do not have a medical condition, use this guide to find out if your blood pressure is normal: Normal First number: below 120. Second number: below 80. Elevated First number: 120-129. Second number: below 80. Hypertension stage 1 First number: 130-139. Second number: 80-89. Hypertension stage 2 First number: 140 or above. Second number: 90 or above. Your blood pressure is above normal even if only the first or only the second number is above normal. Follow these instructions at home: Medicines Take over-the-counter and prescription medicines only as told by your doctor. Tell your doctor if your medicine is causing side effects. General instructions Check your blood pressure as often as your doctor tells you to. Check your blood pressure at the same time every day. Take your monitor to your next doctor's appointment. Your doctor will: Make sure you are using it correctly. Make sure it is working right. Understand what your blood pressure numbers should be. Keep all follow-up visits. General tips You will need a blood pressure machine or monitor. Your doctor can suggest a monitor. You can buy one at a drugstore or online. When choosing one: Choose one with an arm cuff. Choose one that wraps around your   upper arm. Only one finger should fit between your arm and the cuff. Do not choose one that measures your blood pressure from your wrist or finger. Where to find more information American [employer] Association: www.heart.org Contact a doctor if: Your blood pressure keeps being high. Your blood pressure is suddenly low. Get help right away if: Your first blood pressure number is higher than 180. Your second blood pressure number is  higher than 120. These symptoms may be an emergency. Do not wait to see if the symptoms will go away. Get help right away. Call 911. Summary Check your blood pressure at the same time every day. Avoid caffeine, alcohol, smoking, and exercise for 30 minutes before checking your blood pressure. Make sure you understand what your blood pressure numbers should be. This information is not intended to replace advice given to you by your health care provider. Make sure you discuss any questions you have with your health care provider. Document Revised: 07/12/2021 Document Reviewed: 07/12/2021 Elsevier Patient Education  2023 [employer]  

## 2022-09-14 LAB — COMPLETE METABOLIC PANEL WITH GFR
AG Ratio: 1.7 (calc) (ref 1.0–2.5)
ALT: 18 U/L (ref 6–29)
AST: 19 U/L (ref 10–35)
Albumin: 4.2 g/dL (ref 3.6–5.1)
Alkaline phosphatase (APISO): 89 U/L (ref 37–153)
BUN: 14 mg/dL (ref 7–25)
CO2: 31 mmol/L (ref 20–32)
Calcium: 9.6 mg/dL (ref 8.6–10.4)
Chloride: 100 mmol/L (ref 98–110)
Creat: 0.88 mg/dL (ref 0.60–1.00)
Globulin: 2.5 g/dL (calc) (ref 1.9–3.7)
Glucose, Bld: 107 mg/dL — ABNORMAL HIGH (ref 65–99)
Potassium: 4.3 mmol/L (ref 3.5–5.3)
Sodium: 140 mmol/L (ref 135–146)
Total Bilirubin: 0.9 mg/dL (ref 0.2–1.2)
Total Protein: 6.7 g/dL (ref 6.1–8.1)
eGFR: 67 mL/min/{1.73_m2} (ref >=60)

## 2022-09-14 LAB — HEMOGLOBIN A1C
Hgb A1c MFr Bld: 5.8 % of total Hgb — ABNORMAL HIGH (ref <5.7)
Mean Plasma Glucose: 120 mg/dL
eAG (mmol/L): 6.6 mmol/L

## 2022-09-25 ENCOUNTER — Ambulatory Visit (INDEPENDENT_AMBULATORY_CARE_PROVIDER_SITE_OTHER): Payer: Medicare Other | Admitting: Family Medicine

## 2022-09-25 ENCOUNTER — Encounter: Payer: Self-pay | Admitting: Family Medicine

## 2022-09-25 VITALS — BP 138/68 | HR 87 | Temp 98.5°F | Resp 16 | Ht 62.0 in | Wt 193.0 lb

## 2022-09-25 DIAGNOSIS — L309 Dermatitis, unspecified: Secondary | ICD-10-CM

## 2022-09-25 DIAGNOSIS — S80812A Abrasion, left lower leg, initial encounter: Secondary | ICD-10-CM

## 2022-09-25 DIAGNOSIS — L259 Unspecified contact dermatitis, unspecified cause: Secondary | ICD-10-CM | POA: Diagnosis not present

## 2022-09-25 MED ORDER — TRIAMCINOLONE ACETONIDE 0.1 % EX OINT: 1.0000 | TOPICAL_OINTMENT | Freq: Two times a day (BID) | CUTANEOUS | 2 refills | Status: DC | PRN

## 2022-09-25 NOTE — Progress Notes (Signed)
Patient ID: Susan Summers, female    DOB: [birth year], 81 y.o.   MRN: [id number]  PCP: Delsa Grana, PA-C  Chief Complaint  Patient presents with   Abrasion    On left Leg scraped with her bed frame    Subjective:   Susan Summers is a 81 y.o. female, presents to clinic with CC of the following:  HPI  Pt got injury to leg last week - a metal bed frame gouged her leg cutting it, located to lower left outer leg.  She has put alcohol and hydrogen peroxide on it multiple times Is has not healed up and has some redness around the wound which has scabbed No spreading redness, drainage of puss or pain She is UTD on tetanus - 2017  Rash and multiple allergies Top of left foot and bilateral forearms - has allergy to wool She is doing some lotions for her dry skin She has a steroid ointment she's used on her foot that helped a little bit  The wool rash/skin reaction started only recently with colder weather when wearing wool clothes and new slippers Rash is not spreading, no SOB, lip swelling, wheeze    Patient Active Problem List   Diagnosis Date Noted   History of prediabetes 03/14/2022   Bilateral carpal tunnel syndrome 09/20/2020   Class 1 obesity with serious comorbidity and body mass index (BMI) of 34.0 to 34.9 in adult 08/07/2018   Cramp in muscle 02/04/2018   H/O malignant neoplasm of skin 02/04/2018   Osteopenia of the elderly 02/04/2018   Post menopausal syndrome 02/04/2018   Chronic lower back pain 02/04/2018   Prediabetes 11/13/2016   Benign hypertension 04/25/2015   Hyperlipidemia 04/25/2015   Plantar fasciitis 04/25/2015      Current Outpatient Medications:    Cholecalciferol (VITAMIN D PO), Take 4,000 Units by mouth daily. , Disp: , Rfl:    diphenhydrAMINE HCl (NERVINE PO), Take by mouth., Disp: , Rfl:    ELDERBERRY PO, Take 1,250 mg by mouth daily. , Disp: , Rfl:    Garlic 270 MG TABS, Take by mouth daily., Disp: , Rfl:    hydrochlorothiazide (MICROZIDE)  12.5 MG capsule, Take 1 capsule (12.5 mg total) by mouth daily., Disp: 90 capsule, Rfl: 3   meloxicam (MOBIC) 15 MG tablet, Take 0.5-1 tablets (7.5-15 mg total) by mouth daily. PRN, Disp: 30 tablet, Rfl: 2   metroNIDAZOLE (METROCREAM) 0.75 % cream, Apply to mid face every night for Rosacea., Disp: 45 g, Rfl: 5   Probiotic Product (PROBIOTIC DAILY PO), Take by mouth daily. , Disp: , Rfl:    rosuvastatin (CRESTOR) 20 MG tablet, Take 1 tablet (20 mg total) by mouth at bedtime., Disp: 90 tablet, Rfl: 3   telmisartan (MICARDIS) 80 MG tablet, Take 1 tablet (80 mg total) by mouth daily., Disp: 90 tablet, Rfl: 3   Allergies  Allergen Reactions   Nickel    Penicillins    Red Dye     Prior allergy to med with red dye in it, contact dermatitis with yarn      Social History   Tobacco Use   Smoking status: Never   Smokeless tobacco: Never   Tobacco comments:    smoking cessation materials not required  Vaping Use   Vaping Use: Never used  Substance Use Topics   Alcohol use: No    Alcohol/week: 0.0 standard drinks of alcohol   Drug use: No      Chart Review Today: I  personally reviewed active problem list, medication list, allergies, family history, social history, health maintenance, notes from last encounter, lab results, imaging with the patient/caregiver today.   Review of Systems  Constitutional: Negative.   HENT: Negative.    Eyes: Negative.   Respiratory: Negative.    Cardiovascular: Negative.   Gastrointestinal: Negative.   Endocrine: Negative.   Genitourinary: Negative.   Musculoskeletal: Negative.   Skin: Negative.   Allergic/Immunologic: Negative.   Neurological: Negative.   Hematological: Negative.   Psychiatric/Behavioral: Negative.    All other systems reviewed and are negative.      Objective:   Vitals:   09/25/22 0831 09/25/22 0845  BP: (!) 146/70 138/68  Pulse: 87   Resp: 16   Temp: 98.5 F (36.9 C)   TempSrc: Oral   SpO2: 96%   Weight: 193 lb  (87.5 kg)   Height: _0  (1.575 m)     Body mass index is 35.3 kg/m.  Physical Exam Vitals and nursing note reviewed.  Constitutional:      Appearance: She is well-developed.  HENT:     Head: Normocephalic and atraumatic.     Nose: Nose normal.  Eyes:     General:        Right eye: No discharge.        Left eye: No discharge.     Conjunctiva/sclera: Conjunctivae normal.  Neck:     Trachea: No tracheal deviation.  Cardiovascular:     Rate and Rhythm: Normal rate and regular rhythm.  Pulmonary:     Effort: Pulmonary effort is normal. No respiratory distress.     Breath sounds: No stridor.  Musculoskeletal:        General: Normal range of motion.  Skin:    General: Skin is warm and dry.     Findings: Rash present.     Comments: Left lower outer leg with roughly 3 cm diameter area of mild edema with erythema and central 47m diameter wound scabbed, no drainage No ttp, no fluctuance, not indurated, no warmth  Left dorsal foot with concentrated erythematous papular rash small 2-3 mm diameter with some excoriations  Left forearm dry erythematous maculopapular rash scattered with excoriations  Skin to right foot w/o rash and very dry  Neurological:     Mental Status: She is alert.     Motor: No abnormal muscle tone.     Coordination: Coordination normal.  Psychiatric:        Behavior: Behavior normal.      Results for orders placed or performed in visit on 09/13/22  COMPLETE METABOLIC PANEL WITH GFR  Result Value Ref Range   Glucose, Bld 107 (H) 65 - 99 mg/dL   BUN 14 7 - 25 mg/dL   Creat 0.88 0.60 - 1.00 mg/dL   eGFR 67 > OR = 60 mL/min/1.741m  BUN/Creatinine Ratio SEE NOTE: 6 - 22 (calc)   Sodium 140 135 - 146 mmol/L   Potassium 4.3 3.5 - 5.3 mmol/L   Chloride 100 98 - 110 mmol/L   CO2 31 20 - 32 mmol/L   Calcium 9.6 8.6 - 10.4 mg/dL   Total Protein 6.7 6.1 - 8.1 g/dL   Albumin 4.2 3.6 - 5.1 g/dL   Globulin 2.5 1.9 - 3.7 g/dL (calc)   AG Ratio 1.7 1.0 - 2.5  (calc)   Total Bilirubin 0.9 0.2 - 1.2 mg/dL   Alkaline phosphatase (APISO) 89 37 - 153 U/L   AST 19 10 - 35 U/L  ALT 18 6 - 29 U/L  Hemoglobin A1c  Result Value Ref Range   Hgb A1c MFr Bld 5.8 (H) <5.7 % of total Hgb   Mean Plasma Glucose 120 mg/dL   eAG (mmol/L) 6.6 mmol/L       Assessment & Plan:   1. Abrasion of left lower leg, initial encounter Wound care education/counseling: For your wound on your leg I recommend vaseline and keeping it covered with a bandage - moisture will help it heal faster - do not use alcohol or hydrogen peroxide any more Follow up if any redness is worsening Expect it to take 1-2 weeks to heal  2. Contact dermatitis, unspecified contact dermatitis type, unspecified trigger Avoid wool -  Apply the prescription steroid to the raised rash areas to help with inflammation and itching and stop steroids as soon as the severe itching and rash improve - triamcinolone ointment (KENALOG) 0.1 %; Apply 1 Application topically 2 (two) times daily as needed.  Dispense: 30 g; Refill: 2  3. Eczema, unspecified type Moisture for winter - heavier cream like cerave, cetaphil, or I highly recommend vaseline Antihistamines daily - like claritin or zyrtec once daily to twice daily  If your rash and itching is still bad or easily reacting to things continue the antihistamine and add a pepcid 10-20 mg 1 to 2x a day  Educated on importance of moisturizine and sealing skin esp after bathing Can use OTC steroids to areas of skin with mild irritation or itching Triamcinolone to severe areas with raised patchy itching - limit to less than a week and not to face  F/up as needed    Delsa Grana, PA-C 09/25/22 8:47 AM

## 2022-09-25 NOTE — Patient Instructions (Addendum)
Moisture for winter - heavier cream like cerave, cetaphil, or I highly recommend vaseline Antihistamines daily - like claritin or zyrtec once daily to twice daily  If your rash and itching is still bad or easily reacting to things continue the antihistamine and add a pepcid 10-20 mg 1 to 2x a day  Apply the prescription steroid to the raised rash areas to help with inflammation and itching and stop steroids as soon as the severe itching and rash improve - less than a week and not to face ever  For your wound on your leg I recommend vaseline and keeping it covered with a bandage - moisture will help it heal faster - do not use alcohol or hydrogen peroxide any more  Follow up if any redness is worsening

## 2022-11-20 ENCOUNTER — Ambulatory Visit: Payer: Medicare Other | Admitting: Dermatology

## 2022-11-20 ENCOUNTER — Encounter: Payer: Self-pay | Admitting: Dermatology

## 2022-11-20 VITALS — BP 163/70 | HR 79

## 2022-11-20 DIAGNOSIS — L820 Inflamed seborrheic keratosis: Secondary | ICD-10-CM

## 2022-11-20 DIAGNOSIS — L280 Lichen simplex chronicus: Secondary | ICD-10-CM

## 2022-11-20 MED ORDER — CLOBETASOL PROPIONATE 0.05 % EX CREA: TOPICAL_CREAM | CUTANEOUS | 1 refills | Status: DC

## 2022-11-20 NOTE — Patient Instructions (Addendum)
Rash: Start Clobetasol 0.05% cream Apply twice daily to leg for 2 weeks then decrease to once daily for 2 weeks (if still itching)  Topical steroids (such as triamcinolone, fluocinolone, fluocinonide, mometasone, clobetasol, halobetasol, betamethasone, hydrocortisone) can cause thinning and lightening of the skin if they are used for too long in the same area. Your physician has selected the right strength medicine for your problem and area affected on the body. Please use your medication only as directed by your physician to prevent side effects.   Moisturize daily after shower with CeraVe cream.   Recommend OTC Gold Bond Rapid Relief Anti-Itch cream (pramoxine + menthol), CeraVe Anti-itch cream or lotion (pramoxine), Sarna lotion (Original- menthol + camphor or Sensitive- pramoxine) or Eucerin 12 hour Itch Relief lotion (menthol) up to 3 times per day to areas on body that are itchy.  Can take Benadryl at bedtime. Can cause drowsiness.   Cryotherapy Aftercare  Wash gently with soap and water everyday.   Apply Vaseline Jelly daily until healed.   Due to recent changes in healthcare laws, you may see results of your pathology and/or laboratory studies on MyChart before the doctors have had a chance to review them. We understand that in some cases there may be results that are confusing or concerning to you. Please understand that not all results are received at the same time and often the doctors may need to interpret multiple results in order to provide you with the best plan of care or course of treatment. Therefore, we ask that you please give [place] 2 business days to thoroughly review all your results before contacting the office for clarification. Should we see a critical lab result, you will be contacted sooner.   If You Need Anything After Your Visit  If you have any questions or concerns for your doctor, please call our main line at [phone] and press option 4 to reach your doctor's  medical assistant. If no one answers, please leave a voicemail as directed and we will return your call as soon as possible. Messages left after 4 pm will be answered the following business day.   You may also send [place] a message via [place]. We typically respond to MyChart messages within 1-2 business days.  For prescription refills, please ask your pharmacy to contact our office. Our fax number is [phone].  If you have an urgent issue when the clinic is closed that cannot wait until the next business day, you can page your doctor at the number below.    Please note that while we do our best to be available for urgent issues outside of office hours, we are not available 24/7.   If you have an urgent issue and are unable to reach [place], you may choose to seek medical care at your doctor's office, retail clinic, urgent care center, or emergency room.  If you have a medical emergency, please immediately call 911 or go to the emergency department.  Pager Numbers  - Dr. Nehemiah Massed: [phone]  - Dr. Laurence Ferrari: [phone]  - Dr. Nicole Kindred: [phone]  In the event of inclement weather, please call our main line at [phone] for an update on the status of any delays or closures.  Dermatology Medication Tips: Please keep the boxes that topical medications come in in order to help keep track of the instructions about where and how to use these. Pharmacies typically print the medication instructions only on the boxes and not directly on the medication tubes.   If your  medication is too expensive, please contact our office at [phone] option 4 or send [place] a message through [place].   We are unable to tell what your co-pay for medications will be in advance as this is different depending on your insurance coverage. However, we may be able to find a substitute medication at lower cost or fill out paperwork to get insurance to cover a needed medication.   If a prior authorization is required to  get your medication covered by your insurance company, please allow [place] 1-2 business days to complete this process.  Drug prices often vary depending on where the prescription is filled and some pharmacies may offer cheaper prices.  The website www.goodrx.com contains coupons for medications through different pharmacies. The prices here do not account for what the cost may be with help from insurance (it may be cheaper with your insurance), but the website can give you the price if you did not use any insurance.  - You can print the associated coupon and take it with your prescription to the pharmacy.  - You may also stop by our office during regular business hours and pick up a GoodRx coupon card.  - If you need your prescription sent electronically to a different pharmacy, notify our office through [facility] or by phone at [phone] option 4.     Si Usted Necesita Algo Despus de Su Visita  Tambin puede enviarnos un mensaje a travs de Pharmacist, community. Por lo general respondemos a los mensajes de MyChart en el transcurso de 1 a 2 das hbiles.  Para renovar recetas, por favor pida a su farmacia que se ponga en contacto con nuestra oficina. Harland Dingwall de fax es [place] [phone].  Si tiene un asunto urgente cuando la clnica est cerrada y que no puede esperar hasta el siguiente da hbil, puede llamar/localizar a su doctor(a) al nmero que aparece a continuacin.   Por favor, tenga en cuenta que aunque hacemos todo lo posible para estar disponibles para asuntos urgentes fuera del horario de [place], no estamos disponibles las 24 horas del da, los 7 das de la [place].   Si tiene un problema urgente y no puede comunicarse con nosotros, puede optar por buscar atencin mdica  en el consultorio de su doctor(a), en una clnica privada, en un centro de atencin urgente o en una sala de emergencias.  Si tiene Engineering geologist, por favor llame inmediatamente al 911 o vaya a la sala de  emergencias.  Nmeros de bper  - Dr. Nehemiah Massed: [phone]  - Dra. Moye: [phone]  - Dra. Nicole Kindred: [phone]  En caso de inclemencias del [place], por favor llame a Johnsie Kindred principal al [phone] para una actualizacin sobre el [place] de cualquier retraso o cierre.  Consejos para la medicacin en dermatologa: Por favor, guarde las cajas en las que vienen los medicamentos de uso tpico para ayudarle a seguir las instrucciones sobre dnde y cmo usarlos. Las farmacias generalmente imprimen las instrucciones del medicamento slo en las cajas y no directamente en los tubos del [place].   Si su medicamento es muy caro, por favor, pngase en contacto con Zigmund Daniel llamando al [phone] y presione la opcin 4 o envenos un mensaje a travs de Pharmacist, community.   No podemos decirle cul ser su copago por los medicamentos por adelantado ya que esto es diferente dependiendo de la cobertura de su seguro. Sin embargo, es posible que podamos encontrar un medicamento sustituto a Electrical engineer un formulario para  que el seguro cubra el medicamento que se considera necesario.   Si se requiere una autorizacin previa para que su compaa de seguros [place] su medicamento, por favor permtanos de 1 a 2 das hbiles para completar este proceso.  Los precios de los medicamentos varan con frecuencia dependiendo del Environmental consultant de dnde se surte la receta y alguna farmacias pueden ofrecer precios ms baratos.  El sitio web www.goodrx.com tiene cupones para medicamentos de Airline pilot. Los precios aqu no tienen en cuenta lo que podra costar con la ayuda del seguro (puede ser ms barato con su seguro), pero el sitio web puede darle el precio si no utiliz Research scientist (physical sciences).  - Puede imprimir el cupn correspondiente y llevarlo con su receta a la farmacia.  - Tambin puede pasar por nuestra oficina durante el horario de atencin regular y Charity fundraiser una tarjeta de cupones de GoodRx.  - Si  necesita que su receta se enve electrnicamente a una farmacia diferente, informe a nuestra oficina a travs de MyChart de [place] o por telfono llamando al [phone] y presione la opcin 4.

## 2022-11-20 NOTE — Progress Notes (Signed)
   Follow-Up Visit   Subjective  Susan Summers is a 82 y.o. female who presents for the following: Rash (Left lower leg.  Cut leg on metal in November, allergic to nickel. Saw PCP.  Did not get tetanus shot. PCP said to use Vaseline Jelly, did not help) and Skin Problem (Check spot on scalp. Raised, sore at times. Will not heal).  She scratches her leg all the time because it is so itchy.    The following portions of the chart were reviewed this encounter and updated as appropriate:      Review of Systems: No other skin or systemic complaints except as noted in HPI or Assessment and Plan.   Objective  Well appearing patient in no apparent distress; mood and affect are within normal limits.  A focused examination was performed including scalp, left lower leg. Relevant physical exam findings are noted in the Assessment and Plan.  Left Lower Leg - Anterior Erythematous lichenified papules, confluent to plaques  Crown Scalp x1 Erythematous keratotic or waxy stuck-on papule or plaque.   Assessment & Plan  Lichen simplex chronicus Left Lower Leg - Anterior  Chronic and persistent condition with duration or expected duration over one year. Condition is bothersome/symptomatic for patient. Currently flared.   Start Clobetasol 0.05% cream Apply twice daily to leg for 2 weeks then decrease to once daily for 2 weeks (if still itching)  Topical steroids (such as triamcinolone, fluocinolone, fluocinonide, mometasone, clobetasol, halobetasol, betamethasone, hydrocortisone) can cause thinning and lightening of the skin if they are used for too long in the same area. Your physician has selected the right strength medicine for your problem and area affected on the body. Please use your medication only as directed by your physician to prevent side effects.   Moisturize daily after shower with CeraVe cream and before bed.   Recommend OTC Gold Bond Rapid Relief Anti-Itch cream (pramoxine +  menthol), CeraVe Anti-itch cream or lotion (pramoxine), Sarna lotion (Original- menthol + camphor or Sensitive- pramoxine) or Eucerin 12 hour Itch Relief lotion (menthol) up to 3 times per day to areas on body that are itchy.  Can take Benadryl at bedtime if itching at night. Can cause drowsiness.   Advised will leave hyperpigmentation once rash has resolved.   clobetasol cream (TEMOVATE) 0.05 % - Left Lower Leg - Anterior Apply twice daily to leg for 2 weeks then decrease to once daily for 2 weeks (if still itching)  Inflamed seborrheic keratosis Crown Scalp x1  Symptomatic, irritating, patient would like treated.   Recheck on follow up.  Destruction of lesion - Crown Scalp x1  Destruction method: cryotherapy   Informed consent: discussed and consent obtained   Lesion destroyed using liquid nitrogen: Yes   Region frozen until ice ball extended beyond lesion: Yes   Outcome: patient tolerated procedure well with no complications   Post-procedure details: wound care instructions given   Additional details:  Prior to procedure, discussed risks of blister formation, small wound, skin dyspigmentation, or rare scar following cryotherapy. Recommend Vaseline ointment to treated areas while healing.    Return in about 1 month (around 12/21/2022) for Rash Follow Up.  I, Emelia Salisbury, CMA, am acting as scribe for Brendolyn Patty, MD.  Documentation: I have reviewed the above documentation for accuracy and completeness, and I agree with the above.  Brendolyn Patty MD

## 2022-12-24 ENCOUNTER — Ambulatory Visit: Payer: Medicare Other | Admitting: Dermatology

## 2022-12-24 VITALS — BP 142/62 | HR 68

## 2022-12-24 DIAGNOSIS — L280 Lichen simplex chronicus: Secondary | ICD-10-CM | POA: Diagnosis not present

## 2022-12-24 NOTE — Patient Instructions (Signed)
Due to recent changes in healthcare laws, you may see results of your pathology and/or laboratory studies on MyChart before the doctors have had a chance to review them. We understand that in some cases there may be results that are confusing or concerning to you. Please understand that not all results are received at the same time and often the doctors may need to interpret multiple results in order to provide you with the best plan of care or course of treatment. Therefore, we ask that you please give us 2 business days to thoroughly review all your results before contacting the office for clarification. Should we see a critical lab result, you will be contacted sooner.   If You Need Anything After Your Visit  If you have any questions or concerns for your doctor, please call our main line at [phone] and press option 4 to reach your doctor's medical assistant. If no one answers, please leave a voicemail as directed and we will return your call as soon as possible. Messages left after 4 pm will be answered the following business day.   You may also send us a message via MyChart. We typically respond to MyChart messages within 1-2 business days.  For prescription refills, please ask your pharmacy to contact our office. Our fax number is [phone].  If you have an urgent issue when the clinic is closed that cannot wait until the next business day, you can page your doctor at the number below.    Please note that while we do our best to be available for urgent issues outside of office hours, we are not available 24/7.   If you have an urgent issue and are unable to reach us, you may choose to seek medical care at your doctor's office, retail clinic, urgent care center, or emergency room.  If you have a medical emergency, please immediately call 911 or go to the emergency department.  Pager Numbers  - Dr. Kowalski: [phone]  - Dr. Moye: [phone]  - Dr. Stewart:  [phone]  In the event of inclement weather, please call our main line at [phone] for an update on the status of any delays or closures.  Dermatology Medication Tips: Please keep the boxes that topical medications come in in order to help keep track of the instructions about where and how to use these. Pharmacies typically print the medication instructions only on the boxes and not directly on the medication tubes.   If your medication is too expensive, please contact our office at [phone] option 4 or send us a message through MyChart.   We are unable to tell what your co-pay for medications will be in advance as this is different depending on your insurance coverage. However, we may be able to find a substitute medication at lower cost or fill out paperwork to get insurance to cover a needed medication.   If a prior authorization is required to get your medication covered by your insurance company, please allow us 1-2 business days to complete this process.  Drug prices often vary depending on where the prescription is filled and some pharmacies may offer cheaper prices.  The website www.goodrx.com contains coupons for medications through different pharmacies. The prices here do not account for what the cost may be with help from insurance (it may be cheaper with your insurance), but the website can give you the price if you did not use any insurance.  - You can print the associated coupon and take it with   your prescription to the pharmacy.  - You may also stop by our office during regular business hours and pick up a GoodRx coupon card.  - If you need your prescription sent electronically to a different pharmacy, notify our office through [facility] MyChart or by phone at [phone] option 4.     Si Usted Necesita Algo Despus de Su Visita  Tambin puede enviarnos un mensaje a travs de MyChart. Por lo general respondemos a los mensajes de MyChart en el transcurso de 1 a 2  das hbiles.  Para renovar recetas, por favor pida a su farmacia que se ponga en contacto con nuestra oficina. Nuestro nmero de fax es el [phone].  Si tiene un asunto urgente cuando la clnica est cerrada y que no puede esperar hasta el siguiente da hbil, puede llamar/localizar a su doctor(a) al nmero que aparece a continuacin.   Por favor, tenga en cuenta que aunque hacemos todo lo posible para estar disponibles para asuntos urgentes fuera del horario de oficina, no estamos disponibles las 24 horas del da, los 7 das de la semana.   Si tiene un problema urgente y no puede comunicarse con nosotros, puede optar por buscar atencin mdica  en el consultorio de su doctor(a), en una clnica privada, en un centro de atencin urgente o en una sala de emergencias.  Si tiene una emergencia mdica, por favor llame inmediatamente al 911 o vaya a la sala de emergencias.  Nmeros de bper  - Dr. Kowalski: [phone]  - Dra. Moye: [phone]  - Dra. Stewart: [phone]  En caso de inclemencias del tiempo, por favor llame a nuestra lnea principal al [phone] para una actualizacin sobre el estado de cualquier retraso o cierre.  Consejos para la medicacin en dermatologa: Por favor, guarde las cajas en las que vienen los medicamentos de uso tpico para ayudarle a seguir las instrucciones sobre dnde y cmo usarlos. Las farmacias generalmente imprimen las instrucciones del medicamento slo en las cajas y no directamente en los tubos del medicamento.   Si su medicamento es muy caro, por favor, pngase en contacto con nuestra oficina llamando al [phone] y presione la opcin 4 o envenos un mensaje a travs de MyChart.   No podemos decirle cul ser su copago por los medicamentos por adelantado ya que esto es diferente dependiendo de la cobertura de su seguro. Sin embargo, es posible que podamos encontrar un medicamento sustituto a menor costo o llenar un formulario para que el  seguro cubra el medicamento que se considera necesario.   Si se requiere una autorizacin previa para que su compaa de seguros cubra su medicamento, por favor permtanos de 1 a 2 das hbiles para completar este proceso.  Los precios de los medicamentos varan con frecuencia dependiendo del lugar de dnde se surte la receta y alguna farmacias pueden ofrecer precios ms baratos.  El sitio web www.goodrx.com tiene cupones para medicamentos de diferentes farmacias. Los precios aqu no tienen en cuenta lo que podra costar con la ayuda del seguro (puede ser ms barato con su seguro), pero el sitio web puede darle el precio si no utiliz ningn seguro.  - Puede imprimir el cupn correspondiente y llevarlo con su receta a la farmacia.  - Tambin puede pasar por nuestra oficina durante el horario de atencin regular y recoger una tarjeta de cupones de GoodRx.  - Si necesita que su receta se enve electrnicamente a una farmacia diferente, informe a nuestra oficina a travs de MyChart de [facility]   o por telfono llamando al [phone] y presione la opcin 4.  

## 2022-12-24 NOTE — Progress Notes (Signed)
   Follow-Up Visit   Subjective  Susan Summers is a 82 y.o. female who presents for the following: LSC (L lower leg, only had to use Cobetasol bid x 2 wks).   The following portions of the chart were reviewed this encounter and updated as appropriate:       Review of Systems:  No other skin or systemic complaints except as noted in HPI or Assessment and Plan.  Objective  Well appearing patient in no apparent distress; mood and affect are within normal limits.  A focused examination was performed including left leg. Relevant physical exam findings are noted in the Assessment and Plan.  L lower leg Clear today, mild xerosis left lower leg    Assessment & Plan  Lichen simplex chronicus L lower leg  Chronic condition with duration or expected duration over one year. Currently well-controlled.   Clear with Clobetasol treatment Cont Cerave cr qd/bid as preventative May restart Clobetasol cr qd/bid prn flares, avoid f/g/a   Topical steroids (such as triamcinolone, fluocinolone, fluocinonide, mometasone, clobetasol, halobetasol, betamethasone, hydrocortisone) can cause thinning and lightening of the skin if they are used for too long in the same area. Your physician has selected the right strength medicine for your problem and area affected on the body. Please use your medication only as directed by your physician to prevent side effects.    Related Medications clobetasol cream (TEMOVATE) 0.05 % Apply twice daily to leg for 2 weeks then decrease to once daily for 2 weeks (if still itching)   Return for as scheduled.  I, Othelia Pulling, RMA, am acting as scribe for Brendolyn Patty, MD .  Documentation: I have reviewed the above documentation for accuracy and completeness, and I agree with the above.  Brendolyn Patty MD

## 2023-01-02 ENCOUNTER — Ambulatory Visit: Payer: Medicare Other

## 2023-01-02 ENCOUNTER — Telehealth: Payer: Self-pay

## 2023-01-02 NOTE — Telephone Encounter (Signed)
Left Message - i called pt to do AWV. I left msg to rtn call to office.   Left Message - I called pt and left msg to call office to r/s AWV at her convenience

## 2023-01-09 DIAGNOSIS — H2513 Age-related nuclear cataract, bilateral: Secondary | ICD-10-CM | POA: Diagnosis not present

## 2023-01-20 ENCOUNTER — Ambulatory Visit: Payer: Self-pay

## 2023-01-20 NOTE — Telephone Encounter (Signed)
  Chief Complaint: COVID Symptoms: sore throat and low grade fever that has went away  Frequency: Saturday Pertinent Negatives: Patient denies SOB or cough Disposition: [] ED /[] Urgent Care (no appt availability in office) / [] Appointment(In office/virtual)/ []  [facility]/ [x] Home Care/ [] Refused Recommended Disposition /[] [facility]/ []  Follow-up with PCP Additional Notes: pt states her sx started Saturday and tested Sunday. Only has sore throat in the mornings and gets better throughout the day. Gave pt care advice and recommended just monitoring at home but if sx get worse or other sx develop to call pt and see about antiviral. Pt verbalized understanding.   Summary: COVID positive / sore throat   Pt stated she tested positive for COVID Saturday. Stated current symptom is a sore throat.  Pt is asking what to do and says she has all her shots.  Pt seeking clinical.     Reason for Disposition  [1] COVID-19 diagnosed by positive lab test (e.g., PCR, rapid self-test kit) AND [2] mild symptoms (e.g., cough, fever, others) AND [6] no complications or SOB  Answer Assessment - Initial Assessment Questions 1. COVID-19 DIAGNOSIS: "How do you know that you have COVID?" (e.g., positive lab test or self-test, diagnosed by doctor or NP/PA, symptoms after exposure).     Home test Sunday 3. ONSET: "When did the COVID-19 symptoms start?"      Saturday  5. COUGH: "Do you have a cough?" If Yes, ask: "How bad is the cough?"       no 6. FEVER: "Do you have a fever?" If Yes, ask: "What is your temperature, how was it measured, and when did it start?"     Low grade  7. RESPIRATORY STATUS: "Describe your breathing?" (e.g., normal; shortness of breath, wheezing, unable to speak)      none 9. OTHER SYMPTOMS: "Do you have any other symptoms?"  (e.g., chills, fatigue, headache, loss of smell or taste, muscle pain, sore throat)     Sore throat  10. HIGH RISK DISEASE: "Do you have any  chronic medical problems?" (e.g., asthma, heart or lung disease, weak immune system, obesity, etc.)       no 11. VACCINE: "Have you had the COVID-19 vaccine?" If Yes, ask: "Which one, how many shots, when did you get it?"       Yes  Protocols used: Coronavirus (COVID-19) Diagnosed or Suspected-A-AH

## 2023-01-29 ENCOUNTER — Telehealth: Payer: Self-pay | Admitting: Family Medicine

## 2023-01-29 NOTE — Telephone Encounter (Signed)
Contacted Susan Summers to schedule their annual wellness visit. Appointment made for 02/21/2023.  [facility] Direct Dial: [phone]

## 2023-02-21 ENCOUNTER — Ambulatory Visit (INDEPENDENT_AMBULATORY_CARE_PROVIDER_SITE_OTHER): Payer: Medicare Other

## 2023-02-21 VITALS — BP 118/76 | Ht 62.0 in | Wt 184.1 lb

## 2023-02-21 DIAGNOSIS — Z0000 Encounter for general adult medical examination without abnormal findings: Secondary | ICD-10-CM

## 2023-02-21 NOTE — Patient Instructions (Signed)
Susan Summers , Thank you for taking time to come for your Medicare Wellness Visit. I appreciate your ongoing commitment to your health goals. Please review the following plan we discussed and let me know if I can assist you in the future.   These are the goals we discussed:  Goals      DIET - INCREASE WATER INTAKE     Recommend to drink at least 6-8 8oz glasses of water per day.     Exercise      Starting 12/16/16, I will start exercising 3 days week for 30 minutes.         This is a list of the screening recommended for you and due dates:  Health Maintenance  Topic Date Due   Mammogram  06/27/2022   COVID-19 Vaccine (5 - 2023-24 season) 07/12/2022   Flu Shot  06/12/2023   Medicare Annual Wellness Visit  02/21/2024   DEXA scan (bone density measurement)  06/27/2024   DTaP/Tdap/Td vaccine (3 - Td or Tdap) 08/12/2026   Pneumonia Vaccine  Completed   Hepatitis C Screening: USPSTF Recommendation to screen - Ages 37-79 yo.  Completed   Zoster (Shingles) Vaccine  Completed   HPV Vaccine  Aged Out   Cologuard (Stool DNA test)  Discontinued    Advanced directives: yes  Conditions/risks identified: none  Next appointment: Follow up in one year for your annual wellness visit 02/26/2024@ 1:30pm in person   Preventive Care 65 Years and Older, Female Preventive care refers to lifestyle choices and visits with your health care provider that can promote health and wellness. What does preventive care include? A yearly physical exam. This is also called an annual well check. Dental exams once or twice a year. Routine eye exams. Ask your health care provider how often you should have your eyes checked. Personal lifestyle choices, including: Daily care of your teeth and gums. Regular physical activity. Eating a healthy diet. Avoiding tobacco and drug use. Limiting alcohol use. Practicing safe sex. Taking low-dose aspirin every day. Taking vitamin and mineral supplements as recommended by  your health care provider. What happens during an annual well check? The services and screenings done by your health care provider during your annual well check will depend on your age, overall health, lifestyle risk factors, and family history of disease. Counseling  Your health care provider may ask you questions about your: Alcohol use. Tobacco use. Drug use. Emotional well-being. Home and relationship well-being. Sexual activity. Eating habits. History of falls. Memory and ability to understand (cognition). Work and work Astronomer. Reproductive health. Screening  You may have the following tests or measurements: Height, weight, and BMI. Blood pressure. Lipid and cholesterol levels. These may be checked every 5 years, or more frequently if you are over 62 years old. Skin check. Lung cancer screening. You may have this screening every year starting at age 77 if you have a 30-pack-year history of smoking and currently smoke or have quit within the past 15 years. Fecal occult blood test (FOBT) of the stool. You may have this test every year starting at age 83. Flexible sigmoidoscopy or colonoscopy. You may have a sigmoidoscopy every 5 years or a colonoscopy every 10 years starting at age 7. Hepatitis C blood test. Hepatitis B blood test. Sexually transmitted disease (STD) testing. Diabetes screening. This is done by checking your blood sugar (glucose) after you have not eaten for a while (fasting). You may have this done every 1-3 years. Bone density scan. This is  done to screen for osteoporosis. You may have this done starting at age 13. Mammogram. This may be done every 1-2 years. Talk to your health care provider about how often you should have regular mammograms. Talk with your health care provider about your test results, treatment options, and if necessary, the need for more tests. Vaccines  Your health care provider may recommend certain vaccines, such as: Influenza  vaccine. This is recommended every year. Tetanus, diphtheria, and acellular pertussis (Tdap, Td) vaccine. You may need a Td booster every 10 years. Zoster vaccine. You may need this after age 65. Pneumococcal 13-valent conjugate (PCV13) vaccine. One dose is recommended after age 19. Pneumococcal polysaccharide (PPSV23) vaccine. One dose is recommended after age 71. Talk to your health care provider about which screenings and vaccines you need and how often you need them. This information is not intended to replace advice given to you by your health care provider. Make sure you discuss any questions you have with your health care provider. Document Released: 11/24/2015 Document Revised: 07/17/2016 Document Reviewed: 08/29/2015 Elsevier Interactive Patient Education  2017 [employer] Prevention in the Home Falls can cause injuries. They can happen to people of all ages. There are many things you can do to make your home safe and to help prevent falls. What can I do on the outside of my home? Regularly fix the edges of walkways and driveways and fix any cracks. Remove anything that might make you trip as you walk through a door, such as a raised step or threshold. Trim any bushes or trees on the path to your home. Use bright outdoor lighting. Clear any walking paths of anything that might make someone trip, such as rocks or tools. Regularly check to see if handrails are loose or broken. Make sure that both sides of any steps have handrails. Any raised decks and porches should have guardrails on the edges. Have any leaves, snow, or ice cleared regularly. Use sand or salt on walking paths during winter. Clean up any spills in your garage right away. This includes oil or grease spills. What can I do in the bathroom? Use night lights. Install grab bars by the toilet and in the tub and shower. Do not use towel bars as grab bars. Use non-skid mats or decals in the tub or shower. If you  need to sit down in the shower, use a plastic, non-slip stool. Keep the floor dry. Clean up any water that spills on the floor as soon as it happens. Remove soap buildup in the tub or shower regularly. Attach bath mats securely with double-sided non-slip rug tape. Do not have throw rugs and other things on the floor that can make you trip. What can I do in the bedroom? Use night lights. Make sure that you have a light by your bed that is easy to reach. Do not use any sheets or blankets that are too big for your bed. They should not hang down onto the floor. Have a firm chair that has side arms. You can use this for support while you get dressed. Do not have throw rugs and other things on the floor that can make you trip. What can I do in the kitchen? Clean up any spills right away. Avoid walking on wet floors. Keep items that you use a lot in easy-to-reach places. If you need to reach something above you, use a strong step stool that has a grab bar. Keep electrical cords out of  the way. Do not use floor polish or wax that makes floors slippery. If you must use wax, use non-skid floor wax. Do not have throw rugs and other things on the floor that can make you trip. What can I do with my stairs? Do not leave any items on the stairs. Make sure that there are handrails on both sides of the stairs and use them. Fix handrails that are broken or loose. Make sure that handrails are as long as the stairways. Check any carpeting to make sure that it is firmly attached to the stairs. Fix any carpet that is loose or worn. Avoid having throw rugs at the top or bottom of the stairs. If you do have throw rugs, attach them to the floor with carpet tape. Make sure that you have a light switch at the top of the stairs and the bottom of the stairs. If you do not have them, ask someone to add them for you. What else can I do to help prevent falls? Wear shoes that: Do not have high heels. Have rubber  bottoms. Are comfortable and fit you well. Are closed at the toe. Do not wear sandals. If you use a stepladder: Make sure that it is fully opened. Do not climb a closed stepladder. Make sure that both sides of the stepladder are locked into place. Ask someone to hold it for you, if possible. Clearly mark and make sure that you can see: Any grab bars or handrails. First and last steps. Where the edge of each step is. Use tools that help you move around (mobility aids) if they are needed. These include: Canes. Walkers. Scooters. Crutches. Turn on the lights when you go into a dark area. Replace any light bulbs as soon as they burn out. Set up your furniture so you have a clear path. Avoid moving your furniture around. If any of your floors are uneven, fix them. If there are any pets around you, be aware of where they are. Review your medicines with your doctor. Some medicines can make you feel dizzy. This can increase your chance of falling. Ask your doctor what other things that you can do to help prevent falls. This information is not intended to replace advice given to you by your health care provider. Make sure you discuss any questions you have with your health care provider. Document Released: 08/24/2009 Document Revised: 04/04/2016 Document Reviewed: 12/02/2014 Elsevier Interactive Patient Education  2017 [employer].

## 2023-02-21 NOTE — Progress Notes (Signed)
Subjective:   Susan Summers is a 82 y.o. female who presents for Medicare Annual (Subsequent) preventive examination.  Review of Systems    Cardiac Risk Factors include: advanced age (>6men, >8 women);dyslipidemia;hypertension;obesity (BMI >30kg/m2)    Objective:    Today's Vitals   02/21/23 1335  Weight: 184 lb 1.6 oz (83.5 kg)  Height:  (1.575 m)   Body mass index is 33.67 kg/m.     02/21/2023    1:46 PM 01/01/2022   10:05 AM 12/28/2020    9:37 AM 12/28/2019    9:07 AM 12/24/2018    9:05 AM 12/19/2017    2:11 PM 08/07/2017    9:21 AM  Advanced Directives  Does Patient Have a Medical Advance Directive? Yes Yes Yes No No Yes Yes  Type of Special educational needs teacher of [place];Living will Healthcare Power of [place];Living will   Healthcare Power of [place];Living will   Copy of Healthcare Power of Attorney in Chart?  Yes - validated most recent copy scanned in chart (See row information) Yes - validated most recent copy scanned in chart (See row information)   No - copy requested   Would patient like information on creating a medical advance directive?    No - Patient declined No - Patient declined      Current Medications (verified) Outpatient Encounter Medications as of 02/21/2023  Medication Sig   Cholecalciferol (VITAMIN D PO) Take 4,000 Units by mouth daily.    clobetasol cream (TEMOVATE) 0.05 % Apply twice daily to leg for 2 weeks then decrease to once daily for 2 weeks (if still itching)   diphenhydrAMINE HCl (NERVINE PO) Take by mouth.   ELDERBERRY PO Take 1,250 mg by mouth daily.    Garlic 100 MG TABS Take by mouth daily.   hydrochlorothiazide (MICROZIDE) 12.5 MG capsule Take 1 capsule (12.5 mg total) by mouth daily.   meloxicam (MOBIC) 15 MG tablet Take 0.5-1 tablets (7.5-15 mg total) by mouth daily. PRN   metroNIDAZOLE (METROCREAM) 0.75 % cream Apply to mid face every night for Rosacea.   Probiotic Product (PROBIOTIC DAILY PO) Take by mouth daily.     rosuvastatin (CRESTOR) 20 MG tablet Take 1 tablet (20 mg total) by mouth at bedtime.   telmisartan (MICARDIS) 80 MG tablet Take 1 tablet (80 mg total) by mouth daily.   triamcinolone ointment (KENALOG) 0.1 % Apply 1 Application topically 2 (two) times daily as needed.   No facility-administered encounter medications on file as of 02/21/2023.    Allergies (verified) Nickel, Penicillins, and Red dye   History: Past Medical History:  Diagnosis Date   At risk for falling 02/04/2018   Carpal tunnel syndrome    Herpes zona 02/04/2018   T8 right   Hyperlipidemia    Hypertension    Osteopenia    Prediabetes    Psoriasis 02/04/2018   elbow   Swelling of both ankles 08/09/2015   Past Surgical History:  Procedure Laterality Date   ABDOMINAL HYSTERECTOMY     APPENDECTOMY     BREAST BIOPSY Left 10/20/2018   [place] bx, path pending   HERNIA REPAIR     oopherrectomy  2012   UMBILICAL HERNIA REPAIR  2012   Family History  Problem Relation Age of Onset   Hypertension Mother    Stroke Mother    Rheum arthritis Mother    Diabetes Father    Cancer Sister        Ovarian   Diabetes Sister  Hypertension Brother    Melanoma Daughter    Hypertension Son    Hyperlipidemia Son    Stroke Brother    Cancer Brother        Prostate   Other Brother        bowel obstruction   Hyperlipidemia Sister    Cancer Sister        skin- SCC removed   Social History   Socioeconomic History   Marital status: Widowed    Spouse name: Freida Busman   Number of children: 2   Years of education: Not on file   Highest education level: 12th grade  Occupational History   Occupation: Retired  Tobacco Use   Smoking status: Never   Smokeless tobacco: Never   Tobacco comments:    smoking cessation materials not required  Vaping Use   Vaping Use: Never used  Substance and Sexual Activity   Alcohol use: No    Alcohol/week: 0.0 standard drinks of alcohol   Drug use: No   Sexual activity: Not Currently  Other  Topics Concern   Not on file  Social History Narrative   Pt lives alone   Social Determinants of Health   Financial Resource Strain: Low Risk  (02/21/2023)   Overall Financial Resource Strain (CARDIA)    Difficulty of Paying Living Expenses: Not hard at all  Food Insecurity: No Food Insecurity (02/21/2023)   Hunger Vital Sign    Worried About Running Out of Food in the Last Year: Never true    Ran Out of Food in the Last Year: Never true  Transportation Needs: No Transportation Needs (02/21/2023)   PRAPARE - Administrator, Civil Service (Medical): No    Lack of Transportation (Non-Medical): No  Physical Activity: Sufficiently Active (02/21/2023)   Exercise Vital Sign    Days of Exercise per Week: 4 days    Minutes of Exercise per Session: 40 min  Stress: No Stress Concern Present (02/21/2023)   [employer] of Occupational Health - Occupational Stress Questionnaire    Feeling of Stress : Not at all  Social Connections: Moderately Integrated (02/21/2023)   Social Connection and Isolation Panel [NHANES]    Frequency of Communication with Friends and Family: More than three times a week    Frequency of Social Gatherings with Friends and Family: Three times a week    Attends Religious Services: More than 4 times per year    Active Member of Clubs or Organizations: Yes    Attends Banker Meetings: More than 4 times per year    Marital Status: Widowed    Tobacco Counseling Counseling given: Not Answered Tobacco comments: smoking cessation materials not required   Clinical Intake:  Pre-visit preparation completed: Yes  Pain : No/denies pain     BMI - recorded: 33.67 Nutritional Status: BMI > 30  Obese Nutritional Risks: None Diabetes: No  How often do you need to have someone help you when you read instructions, pamphlets, or other written materials from your doctor or pharmacy?: 1 - Never  Diabetic?no  Interpreter Needed?: No  Comments: son  lives with her Information entered by :: B.Ozelle Brubacher,LPN   Activities of Daily Living    02/21/2023    1:46 PM 09/25/2022    8:31 AM  In your present state of health, do you have any difficulty performing the following activities:  Hearing? 0 0  Vision? 0 0  Difficulty concentrating or making decisions? 0 0  Walking or climbing stairs? 0  0  Dressing or bathing? 0 0  Doing errands, shopping? 0 0  Preparing Food and eating ? N   Using the Toilet? N   In the past six months, have you accidently leaked urine? Y   Do you have problems with loss of bowel control? N   Managing your Medications? N   Managing your Finances? N   Housekeeping or managing your Housekeeping? N     Patient Care Team: Danelle Berry, PA-C as PCP - General (Family Medicine) Deeann Saint, MD (Orthopedic Surgery) Lockie Mola, MD as Referring Physician (Ophthalmology) Willeen Niece, MD (Dermatology)  Indicate any recent Medical Services you may have received from other than [facility] providers in the past year (date may be approximate).     Assessment:   This is a routine wellness examination for [place].  Hearing/Vision screen Hearing Screening - Comments:: Adequate hearing Vision Screening - Comments:: Adequate vision w/glasses [facility]  Dietary issues and exercise activities discussed: Current Exercise Habits: Home exercise routine, Type of exercise: walking, Time (Minutes): 40, Frequency (Times/Week): 4, Weekly Exercise (Minutes/Week): 160, Intensity: Mild, Exercise limited by: orthopedic condition(s)   Goals Addressed             This Visit's Progress    DIET - INCREASE WATER INTAKE   On track    Recommend to drink at least 6-8 8oz glasses of water per day.     Exercise    On track    Starting 12/16/16, I will start exercising 3 days week for 30 minutes.        Depression Screen    02/21/2023    1:41 PM 09/25/2022    8:31 AM 09/13/2022    8:58 AM 03/14/2022    8:42 AM 01/01/2022    10:04 AM 09/13/2021    9:44 AM 03/13/2021    9:10 AM  PHQ 2/9 Scores  PHQ - 2 Score 0 0 0 0 0 0 0  PHQ- 9 Score  0 0 0  0 0    Fall Risk    02/21/2023    1:38 PM 09/25/2022    8:31 AM 09/13/2022    8:58 AM 03/14/2022    8:42 AM 01/01/2022   10:05 AM  Fall Risk   Falls in the past year? 0 0 0 0 0  Number falls in past yr: 0 0 0 0 0  Injury with Fall? 0 0 0 0 0  Risk for fall due to : No Fall Risks No Fall Risks No Fall Risks No Fall Risks No Fall Risks  Follow up Falls prevention discussed;Education provided Falls prevention discussed;Education provided;Falls evaluation completed Falls prevention discussed;Education provided;Falls evaluation completed Falls prevention discussed Falls prevention discussed    FALL RISK PREVENTION PERTAINING TO THE HOME:  Any stairs in or around the home? Yes  If so, are there any without handrails? Yes  Home free of loose throw rugs in walkways, pet beds, electrical cords, etc? Yes  Adequate lighting in your home to reduce risk of falls? Yes   ASSISTIVE DEVICES UTILIZED TO PREVENT FALLS:  Life alert? No  Use of a cane, walker or w/c? No  Grab bars in the bathroom? Yes  Shower chair or bench in shower? Yes does not use Elevated toilet seat or a handicapped toilet? Yes   TIMED UP AND GO:  Was the test performed? Yes .  Length of time to ambulate 10 feet: 10 sec.   Gait slow and steady without use of assistive device  Cognitive Function:        02/21/2023    1:47 PM 12/28/2019    9:10 AM 12/24/2018    9:10 AM 12/19/2017    2:01 PM 12/16/2016   11:12 AM  6CIT Screen  What Year? 0 points 0 points 0 points 0 points 0 points  What month? 0 points 0 points 0 points 0 points 0 points  What time? 0 points 0 points 0 points 0 points 0 points  Count back from 20 0 points 0 points 0 points 0 points 0 points  Months in reverse 0 points 0 points 0 points 0 points 0 points  Repeat phrase 0 points 0 points 0 points 2 points 0 points  Total Score 0 points 0  points 0 points 2 points 0 points    Immunizations Immunization History  Administered Date(s) Administered   Fluad Quad(high Dose 65+) 07/21/2019, 08/21/2022   Influenza, High Dose Seasonal PF 08/07/2017, 08/07/2018   Influenza,inj,quad, With Preservative 08/11/2020   Influenza-Unspecified 07/12/2014, 08/02/2015, 08/12/2016, 08/07/2017, 08/07/2018, 08/23/2020, 07/26/2021   PFIZER(Purple Top)SARS-COV-2 Vaccination 11/16/2019, 12/07/2019, 08/08/2020, 02/08/2021   Pneumococcal Conjugate-13 07/26/2014   Pneumococcal Polysaccharide-23 12/16/2016   Td 11/12/2003   Tdap 08/12/2016   Zoster Recombinat (Shingrix) 02/27/2022, 05/06/2022    TDAP status: Up to date  Flu Vaccine status: Up to date  Pneumococcal vaccine status: Up to date  Covid-19 vaccine status: Completed vaccines  Qualifies for Shingles Vaccine? Yes   Zostavax completed Yes   Shingrix Completed?: Yes  Screening Tests Health Maintenance  Topic Date Due   MAMMOGRAM  06/27/2022   COVID-19 Vaccine (5 - 2023-24 season) 07/12/2022   INFLUENZA VACCINE  06/12/2023   Medicare Annual Wellness (AWV)  02/21/2024   DEXA SCAN  06/27/2024   DTaP/Tdap/Td (3 - Td or Tdap) 08/12/2026   Pneumonia Vaccine 7+ Years old  Completed   Hepatitis C Screening  Completed   Zoster Vaccines- Shingrix  Completed   HPV VACCINES  Aged Out   Fecal DNA (Cologuard)  Discontinued    Health Maintenance  Health Maintenance Due  Topic Date Due   MAMMOGRAM  06/27/2022   COVID-19 Vaccine (5 - 2023-24 season) 07/12/2022    Colorectal cancer screening: No longer required.   Mammogram status: No longer required due to age.  Bone Density status: Completed yes. Results reflect: Bone density results: OSTEOPENIA. Repeat every 5 years.  Lung Cancer Screening: (Low Dose CT Chest recommended if Age 50-80 years, 30 pack-year currently smoking OR have quit w/in 15years.) does not qualify.   Lung Cancer Screening Referral: no  Additional  Screening:  Hepatitis C Screening: does not qualify; Completed no  Vision Screening: Recommended annual ophthalmology exams for early detection of glaucoma and other disorders of the eye. Is the patient up to date with their annual eye exam?  Yes  Who is the provider or what is the name of the office in which the patient attends annual eye exams? [facility] If pt is not established with a provider, would they like to be referred to a provider to establish care? No .   Dental Screening: Recommended annual dental exams for proper oral hygiene  Community Resource Referral / Chronic Care Management: CRR required this visit?  No   CCM required this visit?  No      Plan:     I have personally reviewed and noted the following in the patient's chart:   Medical and social history Use of alcohol, tobacco or illicit drugs  Current  medications and supplements including opioid prescriptions. Patient is not currently taking opioid prescriptions. Functional ability and status Nutritional status Physical activity Advanced directives List of other physicians Hospitalizations, surgeries, and ER visits in previous 12 months Vitals Screenings to include cognitive, depression, and falls Referrals and appointments  In addition, I have reviewed and discussed with patient certain preventive protocols, quality metrics, and best practice recommendations. A written personalized care plan for preventive services as well as general preventive health recommendations were provided to patient.     Sue Lush, LPN   [id number]/61/0960   Nurse Notes: The patient states she is doing better as last month recovery from Covid and tooth infection. She has no concerns or questions at this time.

## 2023-03-13 ENCOUNTER — Ambulatory Visit: Payer: Medicare Other

## 2023-03-14 ENCOUNTER — Ambulatory Visit (INDEPENDENT_AMBULATORY_CARE_PROVIDER_SITE_OTHER): Payer: Medicare Other | Admitting: Family Medicine

## 2023-03-14 ENCOUNTER — Encounter: Payer: Self-pay | Admitting: Family Medicine

## 2023-03-14 VITALS — BP 130/72 | HR 75 | Temp 97.7°F | Resp 16 | Ht 61.25 in | Wt 184.5 lb

## 2023-03-14 DIAGNOSIS — Z0000 Encounter for general adult medical examination without abnormal findings: Secondary | ICD-10-CM | POA: Diagnosis not present

## 2023-03-14 DIAGNOSIS — E785 Hyperlipidemia, unspecified: Secondary | ICD-10-CM | POA: Diagnosis not present

## 2023-03-14 DIAGNOSIS — R7303 Prediabetes: Secondary | ICD-10-CM | POA: Diagnosis not present

## 2023-03-14 NOTE — Progress Notes (Signed)
Patient: Susan Summers, Female    DOB: May 15, 1941, 82 y.o.   MRN: [id number] Danelle Berry, PA-C Visit Date: 03/14/2023  Today's Provider: Danelle Berry, PA-C   Chief Complaint  Patient presents with   Annual Exam   Subjective:   Annual physical exam:  Susan Summers is a 82 y.o. female who presents today for complete physical exam:  Exercise/Activity:  walking more lost some weight Diet/nutrition:   no big diet changes, cut back on portions Sleep:  good  Wt Readings from Last 5 Encounters:  03/14/23 184 lb 8 oz (83.7 kg)  02/21/23 184 lb 1.6 oz (83.5 kg)  09/25/22 193 lb (87.5 kg)  09/13/22 191 lb (86.6 kg)  03/14/22 189 lb (85.7 kg)   BMI Readings from Last 5 Encounters:  03/14/23 34.58 kg/m  02/21/23 33.67 kg/m  09/25/22 35.30 kg/m  09/13/22 34.93 kg/m  03/14/22 34.57 kg/m     SDOH Screenings   Food Insecurity: No Food Insecurity (02/21/2023)  Housing: Low Risk  (02/21/2023)  Transportation Needs: No Transportation Needs (02/21/2023)  Utilities: Not At Risk (02/21/2023)  Alcohol Screen: Low Risk  (02/21/2023)  Depression (PHQ2-9): Low Risk  (03/14/2023)  Financial Resource Strain: Low Risk  (02/21/2023)  Physical Activity: Sufficiently Active (02/21/2023)  Social Connections: Moderately Integrated (02/21/2023)  Stress: No Stress Concern Present (02/21/2023)  Tobacco Use: Low Risk  (03/14/2023)    USPSTF grade A and B recommendations - reviewed and addressed today  Depression:  Phq 9 completed today by patient, was reviewed by me with patient in the room PHQ score is negative, pt feels good    03/14/2023    8:50 AM 02/21/2023    1:41 PM 09/25/2022    8:31 AM 09/13/2022    8:58 AM  PHQ 2/9 Scores  PHQ - 2 Score 0 0 0 0  PHQ- 9 Score 0  0 0      03/14/2023    8:50 AM 02/21/2023    1:41 PM 09/25/2022    8:31 AM 09/13/2022    8:58 AM 03/14/2022    8:42 AM  Depression screen PHQ 2/9  Decreased Interest 0 0 0 0 0  Down, Depressed, Hopeless 0 0 0 0 0  PHQ - 2 Score  0 0 0 0 0  Altered sleeping 0  0 0 0  Tired, decreased energy 0  0 0 0  Change in appetite 0  0 0 0  Feeling bad or failure about yourself  0  0 0 0  Trouble concentrating 0  0 0 0  Moving slowly or fidgety/restless 0  0 0 0  Suicidal thoughts 0  0 0 0  PHQ-9 Score 0  0 0 0  Difficult doing work/chores Not difficult at all  Not difficult at all Not difficult at all     Alcohol screening: Flowsheet Row Clinical Support from 02/21/2023 in [facility]  AUDIT-C Score 0       Immunizations and Health Maintenance: Health Maintenance  Topic Date Due   MAMMOGRAM  06/27/2022   COVID-19 Vaccine (5 - 2023-24 season) 03/30/2023 (Originally 07/12/2022)   INFLUENZA VACCINE  06/12/2023   Medicare Annual Wellness (AWV)  02/21/2024   DEXA SCAN  06/27/2024   DTaP/Tdap/Td (3 - Td or Tdap) 08/12/2026   Pneumonia Vaccine 46+ Years old  Completed   Hepatitis C Screening  Completed   Zoster Vaccines- Shingrix  Completed   HPV VACCINES  Aged Out   Fecal DNA (  Cologuard)  Discontinued    Hep C Screening:  done  STD testing and prevention (HIV/chl/gon/syphilis):  see above, no additional testing desired by pt today  Intimate partner violence: safe at home, lives with son  Sexual History/Pain during Intercourse: Widowed  Menstrual History/LMP/Abnormal Bleeding:  none No LMP recorded. Patient has had a hysterectomy.  Incontinence Symptoms:  leak with coughing/sneeze  Breast cancer:  scheduled Last Mammogram: *see HM list above BRCA gene screening:   Cervical cancer screening: d/c surgery   Osteoporosis:   Discussion on osteoporosis per age, including high calcium and vitamin D supplementation, weight bearing exercises She does not want to do any more dexa On supplements  Skin cancer:  Hx of skin CA -  NO went to derm Discussed atypical lesions   Colorectal cancer:   Colonoscopy is due- she doesn't want to do another screening Discussed concerning signs and sx  of CRC  Lung cancer:   Low Dose CT Chest recommended if Age 38-80 years, 20 pack-year currently smoking OR have quit w/in 15years. Patient does not qualify.    Social History   Tobacco Use   Smoking status: Never   Smokeless tobacco: Never   Tobacco comments:    smoking cessation materials not required  Vaping Use   Vaping Use: Never used  Substance Use Topics   Alcohol use: No    Alcohol/week: 0.0 standard drinks of alcohol   Drug use: No     Flowsheet Row Clinical Support from 02/21/2023 in [facility]  AUDIT-C Score 0       Family History  Problem Relation Age of Onset   Hypertension Mother    Stroke Mother    Rheum arthritis Mother    Diabetes Father    Cancer Sister        Ovarian   Diabetes Sister    Hyperlipidemia Sister    Cancer Sister        skin- SCC removed   Hypertension Brother    Stroke Brother    Cancer Brother        Prostate   Other Brother        bowel obstruction   Melanoma Daughter    Hypertension Son    Hyperlipidemia Son      Blood pressure/Hypertension: BP Readings from Last 3 Encounters:  03/14/23 130/72  02/21/23 118/76  12/24/22 (!) 142/62    Weight/Obesity: Wt Readings from Last 3 Encounters:  03/14/23 184 lb 8 oz (83.7 kg)  02/21/23 184 lb 1.6 oz (83.5 kg)  09/25/22 193 lb (87.5 kg)   BMI Readings from Last 3 Encounters:  03/14/23 34.58 kg/m  02/21/23 33.67 kg/m  09/25/22 35.30 kg/m     Lipids:  Lab Results  Component Value Date   CHOL 134 03/14/2022   CHOL 130 03/13/2021   CHOL 135 02/28/2020   Lab Results  Component Value Date   HDL 31 (L) 03/14/2022   HDL 36 (L) 03/13/2021   HDL 37 (L) 02/28/2020   Lab Results  Component Value Date   LDLCALC 73 03/14/2022   LDLCALC 68 03/13/2021   LDLCALC 75 02/28/2020   Lab Results  Component Value Date   TRIG 206 (H) 03/14/2022   TRIG 182 (H) 03/13/2021   TRIG 147 02/28/2020   Lab Results  Component Value Date   CHOLHDL 4.3  03/14/2022   CHOLHDL 3.6 03/13/2021   CHOLHDL 3.6 02/28/2020   No results found for: "LDLDIRECT" Based on the results of lipid  panel his/her cardiovascular risk factor ( using [facility] )  in the next 10 years is: The 10-year ASCVD risk score (Arnett DK, et al., 2019) is: 29.9%   Values used to calculate the score:     Age: 82 years     Sex: Female     Is Non-Hispanic African American: No     Diabetic: No     Tobacco smoker: No     Systolic Blood Pressure: 130 mmHg     Is BP treated: Yes     HDL Cholesterol: 31 mg/dL     Total Cholesterol: 134 mg/dL  Glucose:  Glucose, Bld  Date Value Ref Range Status  09/13/2022 107 (H) 65 - 99 mg/dL Final    Comment:    .            Fasting reference interval . For someone without known diabetes, a glucose value between 100 and 125 mg/dL is consistent with prediabetes and should be confirmed with a follow-up test. .   03/14/2022 114 (H) 65 - 99 mg/dL Final    Comment:    .            Fasting reference interval . For someone without known diabetes, a glucose value between 100 and 125 mg/dL is consistent with prediabetes and should be confirmed with a follow-up test. .   09/13/2021 107 (H) 65 - 99 mg/dL Final    Comment:    .            Fasting reference interval . For someone without known diabetes, a glucose value between 100 and 125 mg/dL is consistent with prediabetes and should be confirmed with a follow-up test. .     Advanced Care Planning:  A voluntary discussion about advance care planning including the explanation and discussion of advance directives.   She has done all ACP and brought documents into office to upload to chart  Social History       Social History   Socioeconomic History   Marital status: Widowed    Spouse name: Freida Busman   Number of children: 2   Years of education: Not on file   Highest education level: 12th grade  Occupational History   Occupation: Retired  Tobacco Use   Smoking  status: Never   Smokeless tobacco: Never   Tobacco comments:    smoking cessation materials not required  Vaping Use   Vaping Use: Never used  Substance and Sexual Activity   Alcohol use: No    Alcohol/week: 0.0 standard drinks of alcohol   Drug use: No   Sexual activity: Not Currently  Other Topics Concern   Not on file  Social History Narrative   Pt lives alone   Social Determinants of Health   Financial Resource Strain: Low Risk  (02/21/2023)   Overall Financial Resource Strain (CARDIA)    Difficulty of Paying Living Expenses: Not hard at all  Food Insecurity: No Food Insecurity (02/21/2023)   Hunger Vital Sign    Worried About Running Out of Food in the Last Year: Never true    Ran Out of Food in the Last Year: Never true  Transportation Needs: No Transportation Needs (02/21/2023)   PRAPARE - Administrator, Civil Service (Medical): No    Lack of Transportation (Non-Medical): No  Physical Activity: Sufficiently Active (02/21/2023)   Exercise Vital Sign    Days of Exercise per Week: 4 days    Minutes of Exercise per Session: 40  min  Stress: No Stress Concern Present (02/21/2023)   [employer] of Occupational Health - Occupational Stress Questionnaire    Feeling of Stress : Not at all  Social Connections: Moderately Integrated (02/21/2023)   Social Connection and Isolation Panel [NHANES]    Frequency of Communication with Friends and Family: More than three times a week    Frequency of Social Gatherings with Friends and Family: Three times a week    Attends Religious Services: More than 4 times per year    Active Member of Clubs or Organizations: Yes    Attends Banker Meetings: More than 4 times per year    Marital Status: Widowed    Family History        Family History  Problem Relation Age of Onset   Hypertension Mother    Stroke Mother    Rheum arthritis Mother    Diabetes Father    Cancer Sister        Ovarian   Diabetes Sister     Hyperlipidemia Sister    Cancer Sister        skin- SCC removed   Hypertension Brother    Stroke Brother    Cancer Brother        Prostate   Other Brother        bowel obstruction   Melanoma Daughter    Hypertension Son    Hyperlipidemia Son     Patient Active Problem List   Diagnosis Date Noted   History of prediabetes 03/14/2022   Bilateral carpal tunnel syndrome 09/20/2020   Class 1 obesity with serious comorbidity and body mass index (BMI) of 34.0 to 34.9 in adult 08/07/2018   Cramp in muscle 02/04/2018   H/O malignant neoplasm of skin 02/04/2018   Osteopenia of the elderly 02/04/2018   Post menopausal syndrome 02/04/2018   Chronic lower back pain 02/04/2018   Prediabetes 11/13/2016   Benign hypertension 04/25/2015   Hyperlipidemia 04/25/2015   Plantar fasciitis 04/25/2015    Past Surgical History:  Procedure Laterality Date   ABDOMINAL HYSTERECTOMY     APPENDECTOMY     BREAST BIOPSY Left 10/20/2018   [place] bx, path pending   HERNIA REPAIR     oopherrectomy  2012   UMBILICAL HERNIA REPAIR  2012     Current Outpatient Medications:    Cholecalciferol (VITAMIN D PO), Take 4,000 Units by mouth daily. , Disp: , Rfl:    clobetasol cream (TEMOVATE) 0.05 %, Apply twice daily to leg for 2 weeks then decrease to once daily for 2 weeks (if still itching), Disp: 60 g, Rfl: 1   diphenhydrAMINE HCl (NERVINE PO), Take by mouth., Disp: , Rfl:    ELDERBERRY PO, Take 1,250 mg by mouth daily. , Disp: , Rfl:    Garlic 100 MG TABS, Take by mouth daily., Disp: , Rfl:    hydrochlorothiazide (MICROZIDE) 12.5 MG capsule, Take 1 capsule (12.5 mg total) by mouth daily., Disp: 90 capsule, Rfl: 3   meloxicam (MOBIC) 15 MG tablet, Take 0.5-1 tablets (7.5-15 mg total) by mouth daily. PRN, Disp: 30 tablet, Rfl: 2   metroNIDAZOLE (METROCREAM) 0.75 % cream, Apply to mid face every night for Rosacea., Disp: 45 g, Rfl: 5   Probiotic Product (PROBIOTIC DAILY PO), Take by mouth daily. , Disp: , Rfl:     rosuvastatin (CRESTOR) 20 MG tablet, Take 1 tablet (20 mg total) by mouth at bedtime., Disp: 90 tablet, Rfl: 3   telmisartan (MICARDIS) 80 MG tablet, Take  1 tablet (80 mg total) by mouth daily., Disp: 90 tablet, Rfl: 3   triamcinolone ointment (KENALOG) 0.1 %, Apply 1 Application topically 2 (two) times daily as needed., Disp: 30 g, Rfl: 2  Allergies  Allergen Reactions   Nickel    Penicillins    Red Dye     Prior allergy to med with red dye in it, contact dermatitis with yarn     Patient Care Team: Danelle Berry, PA-C as PCP - General (Family Medicine) Deeann Saint, MD (Orthopedic Surgery) Lockie Mola, MD as Referring Physician (Ophthalmology) Willeen Niece, MD (Dermatology)   Chart Review: I personally reviewed active problem list, medication list, allergies, family history, social history, health maintenance, notes from last encounter, lab results, imaging with the patient/caregiver today.   Review of Systems  Constitutional: Negative.   HENT: Negative.    Eyes: Negative.   Respiratory: Negative.    Cardiovascular: Negative.   Gastrointestinal: Negative.   Endocrine: Negative.   Genitourinary: Negative.   Musculoskeletal: Negative.   Skin: Negative.   Allergic/Immunologic: Negative.   Neurological: Negative.   Hematological: Negative.   Psychiatric/Behavioral: Negative.    All other systems reviewed and are negative.         Objective:   Vitals:  Vitals:   03/14/23 0852  BP: 130/72  Pulse: 75  Resp: 16  Temp: 97.7 F (36.5 C)  TempSrc: Oral  SpO2: 95%  Weight: 184 lb 8 oz (83.7 kg)  Height: 5' 1.25" (1.556 m)    Body mass index is 34.58 kg/m.  Physical Exam Vitals and nursing note reviewed.  Constitutional:      General: She is not in acute distress.    Appearance: Normal appearance. She is well-developed and well-groomed. She is obese. She is not ill-appearing, toxic-appearing or diaphoretic.  HENT:     Head: Normocephalic and  atraumatic.     Right Ear: External ear normal.     Left Ear: External ear normal.     Nose: Nose normal.     Mouth/Throat:     Mouth: Mucous membranes are moist.     Pharynx: Oropharynx is clear. No oropharyngeal exudate or posterior oropharyngeal erythema.  Eyes:     General: Lids are normal. No scleral icterus.       Right eye: No discharge.        Left eye: No discharge.     Conjunctiva/sclera: Conjunctivae normal.  Neck:     Trachea: Phonation normal. No tracheal deviation.  Cardiovascular:     Rate and Rhythm: Normal rate and regular rhythm.     Pulses: Normal pulses.          Radial pulses are 2+ on the right side and 2+ on the left side.       Posterior tibial pulses are 2+ on the right side and 2+ on the left side.     Heart sounds: Normal heart sounds. No murmur heard.    No friction rub. No gallop.  Pulmonary:     Effort: Pulmonary effort is normal. No respiratory distress.     Breath sounds: Normal breath sounds. No stridor. No wheezing, rhonchi or rales.  Chest:     Chest wall: No tenderness.  Abdominal:     General: Bowel sounds are normal. There is no distension.     Palpations: Abdomen is soft.  Musculoskeletal:     Right lower leg: No edema.     Left lower leg: No edema.  Skin:    General:  Skin is warm and dry.     Coloration: Skin is not jaundiced or pale.     Findings: No rash.  Neurological:     Mental Status: She is alert. Mental status is at baseline.     Motor: No abnormal muscle tone.     Gait: Gait normal.  Psychiatric:        Mood and Affect: Mood normal.        Speech: Speech normal.        Behavior: Behavior normal. Behavior is cooperative.       Fall Risk:    03/14/2023    8:50 AM 02/21/2023    1:38 PM 09/25/2022    8:31 AM 09/13/2022    8:58 AM 03/14/2022    8:42 AM  Fall Risk   Falls in the past year? 0 0 0 0 0  Number falls in past yr: 0 0 0 0 0  Injury with Fall? 0 0 0 0 0  Risk for fall due to : No Fall Risks No Fall Risks No  Fall Risks No Fall Risks No Fall Risks  Follow up Falls prevention discussed;Education provided;Falls evaluation completed Falls prevention discussed;Education provided Falls prevention discussed;Education provided;Falls evaluation completed Falls prevention discussed;Education provided;Falls evaluation completed Falls prevention discussed    Functional Status Survey: Is the patient deaf or have difficulty hearing?: No Does the patient have difficulty seeing, even when wearing glasses/contacts?: No Does the patient have difficulty concentrating, remembering, or making decisions?: No Does the patient have difficulty walking or climbing stairs?: No Does the patient have difficulty dressing or bathing?: No Does the patient have difficulty doing errands alone such as visiting a doctor's office or shopping?: No   Assessment & Plan:    CPE completed today  USPSTF grade A and B recommendations reviewed with patient; age-appropriate recommendations, preventive care, screening tests, etc discussed and encouraged; healthy living encouraged; see AVS for patient education given to patient  Discussed importance of 150 minutes of physical activity weekly, AHA exercise recommendations given to pt in AVS/handout  Discussed importance of healthy diet:  eating lean meats and proteins, avoiding trans fats and saturated fats, avoid simple sugars and excessive carbs in diet, eat 6 servings of fruit/vegetables daily and drink plenty of water and avoid sweet beverages.    Recommended pt to do annual eye exam and routine dental exams/cleanings  Depression, alcohol, fall screening completed as documented above and per flowsheets  Advance Care planning information and packet discussed and offered today, encouraged pt to discuss with family members/spouse/partner/friends and complete Advanced directive packet and bring copy to office   Reviewed Health Maintenance: Health Maintenance  Topic Date Due   MAMMOGRAM   06/27/2022   COVID-19 Vaccine (5 - 2023-24 season) 03/30/2023 (Originally 07/12/2022)   INFLUENZA VACCINE  06/12/2023   Medicare Annual Wellness (AWV)  02/21/2024   DEXA SCAN  06/27/2024   DTaP/Tdap/Td (3 - Td or Tdap) 08/12/2026   Pneumonia Vaccine 103+ Years old  Completed   Hepatitis C Screening  Completed   Zoster Vaccines- Shingrix  Completed   HPV VACCINES  Aged Out   Fecal DNA (Cologuard)  Discontinued    Immunizations: Immunization History  Administered Date(s) Administered   Fluad Quad(high Dose 65+) 07/21/2019, 08/21/2022   Influenza, High Dose Seasonal PF 08/07/2017, 08/07/2018   Influenza,inj,quad, With Preservative 08/11/2020   Influenza-Unspecified 07/12/2014, 08/02/2015, 08/12/2016, 08/07/2017, 08/07/2018, 08/23/2020, 07/26/2021   PFIZER(Purple Top)SARS-COV-2 Vaccination 11/16/2019, 12/07/2019, 08/08/2020, 02/08/2021   Pneumococcal Conjugate-13 07/26/2014  Pneumococcal Polysaccharide-23 12/16/2016   Td 11/12/2003   Tdap 08/12/2016   Zoster Recombinat (Shingrix) 02/27/2022, 05/06/2022   Vaccines:  HPV: up to at age 32 , ask insurance if age between 2-45  Shingrix: 59-64 yo and ask insurance if covered when patient above 32 yo Pneumonia: done educated and discussed with patient.     ICD-10-CM   1. Annual physical exam  Z00.00 COMPLETE METABOLIC PANEL WITH GFR    CBC with Differential/Platelet    Lipid panel    Hemoglobin A1c    2. Prediabetes  R73.03 Hemoglobin A1c      Return in about 6 months (around 09/14/2023) for Routine follow-up.     Danelle Berry, PA-C 03/14/23 9:19 AM  [facility]

## 2023-03-14 NOTE — Patient Instructions (Addendum)
Make sure between your different supplements and vitamins you have Vit D 3 2000 IU  Calcium [phone] mg between foods and supplements - or do what dose or in foods that you can tolerate  Preventive Care 65 Years and Older, Female Preventive care refers to lifestyle choices and visits with your health care provider that can promote health and wellness. Preventive care visits are also called wellness exams. What can I expect for my preventive care visit? Counseling Your health care provider may ask you questions about your: Medical history, including: Past medical problems. Family medical history. Pregnancy and menstrual history. History of falls. Current health, including: Memory and ability to understand (cognition). Emotional well-being. Home life and relationship well-being. Sexual activity and sexual health. Lifestyle, including: Alcohol, nicotine or tobacco, and drug use. Access to firearms. Diet, exercise, and sleep habits. Work and work Astronomer. Sunscreen use. Safety issues such as seatbelt and bike helmet use. Physical exam Your health care provider will check your: Height and weight. These may be used to calculate your BMI (body mass index). BMI is a measurement that tells if you are at a healthy weight. Waist circumference. This measures the distance around your waistline. This measurement also tells if you are at a healthy weight and may help predict your risk of certain diseases, such as type 2 diabetes and high blood pressure. Heart rate and blood pressure. Body temperature. Skin for abnormal spots. What immunizations do I need?  Vaccines are usually given at various ages, according to a schedule. Your health care provider will recommend vaccines for you based on your age, medical history, and lifestyle or other factors, such as travel or where you work. What tests do I need? Screening Your health care provider may recommend screening tests for certain conditions.  This may include: Lipid and cholesterol levels. Hepatitis C test. Hepatitis B test. HIV (human immunodeficiency virus) test. STI (sexually transmitted infection) testing, if you are at risk. Lung cancer screening. Colorectal cancer screening. Diabetes screening. This is done by checking your blood sugar (glucose) after you have not eaten for a while (fasting). Mammogram. Talk with your health care provider about how often you should have regular mammograms. BRCA-related cancer screening. This may be done if you have a family history of breast, ovarian, tubal, or peritoneal cancers. Bone density scan. This is done to screen for osteoporosis. Talk with your health care provider about your test results, treatment options, and if necessary, the need for more tests. Follow these instructions at home: Eating and drinking  Eat a diet that includes fresh fruits and vegetables, whole grains, lean protein, and low-fat dairy products. Limit your intake of foods with high amounts of sugar, saturated fats, and salt. Take vitamin and mineral supplements as recommended by your health care provider. Do not drink alcohol if your health care provider tells you not to drink. If you drink alcohol: Limit how much you have to 0-1 drink a day. Know how much alcohol is in your drink. In the [place] one drink equals one 12 oz bottle of beer (355 mL), one 5 oz glass of wine (148 mL), or one 1 oz glass of hard liquor (44 mL). Lifestyle Brush your teeth every morning and night with fluoride toothpaste. Floss one time each day. Exercise for at least 30 minutes 5 or more days each week. Do not use any products that contain nicotine or tobacco. These products include cigarettes, chewing tobacco, and vaping devices, such as e-cigarettes. If you need help quitting,  ask your health care provider. Do not use drugs. If you are sexually active, practice safe sex. Use a condom or other form of protection in order to prevent  STIs. Take aspirin only as told by your health care provider. Make sure that you understand how much to take and what form to take. Work with your health care provider to find out whether it is safe and beneficial for you to take aspirin daily. Ask your health care provider if you need to take a cholesterol-lowering medicine (statin). Find healthy ways to manage stress, such as: Meditation, yoga, or listening to music. Journaling. Talking to a trusted person. Spending time with friends and family. Minimize exposure to UV radiation to reduce your risk of skin cancer. Safety Always wear your seat belt while driving or riding in a vehicle. Do not drive: If you have been drinking alcohol. Do not ride with someone who has been drinking. When you are tired or distracted. While texting. If you have been using any mind-altering substances or drugs. Wear a helmet and other protective equipment during sports activities. If you have firearms in your house, make sure you follow all gun safety procedures. What's next? Visit your health care provider once a year for an annual wellness visit. Ask your health care provider how often you should have your eyes and teeth checked. Stay up to date on all vaccines. This information is not intended to replace advice given to you by your health care provider. Make sure you discuss any questions you have with your health care provider. Document Revised: 04/25/2021 Document Reviewed: 04/25/2021 Elsevier Patient Education  Susan Summers.

## 2023-03-15 LAB — HEMOGLOBIN A1C
Hgb A1c MFr Bld: 5.8 %{Hb} — ABNORMAL HIGH (ref <5.7)
Mean Plasma Glucose: 120 mg/dL
eAG (mmol/L): 6.6 mmol/L

## 2023-03-15 LAB — CBC WITH DIFFERENTIAL/PLATELET
Absolute Monocytes: 439 {cells}/uL (ref 200–950)
Basophils Absolute: 17 {cells}/uL (ref 0–200)
Basophils Relative: 0.3 %
Eosinophils Absolute: 120 {cells}/uL (ref 15–500)
Eosinophils Relative: 2.1 %
HCT: 38.4 % (ref 35.0–45.0)
Hemoglobin: 12.6 g/dL (ref 11.7–15.5)
Lymphs Abs: 1151 {cells}/uL (ref 850–3900)
MCH: 28.6 pg (ref 27.0–33.0)
MCHC: 32.8 g/dL (ref 32.0–36.0)
MCV: 87.3 fL (ref 80.0–100.0)
MPV: 11.1 fL (ref 7.5–12.5)
Monocytes Relative: 7.7 %
Neutro Abs: 3973 {cells}/uL (ref 1500–7800)
Neutrophils Relative %: 69.7 %
Platelets: 169 Thousand/uL (ref 140–400)
RBC: 4.4 Million/uL (ref 3.80–5.10)
RDW: 13.3 % (ref 11.0–15.0)
Total Lymphocyte: 20.2 %
WBC: 5.7 Thousand/uL (ref 3.8–10.8)

## 2023-03-15 LAB — COMPLETE METABOLIC PANEL WITHOUT GFR
AG Ratio: 2 (calc) (ref 1.0–2.5)
ALT: 19 U/L (ref 6–29)
AST: 18 U/L (ref 10–35)
Albumin: 4.5 g/dL (ref 3.6–5.1)
Alkaline phosphatase (APISO): 73 U/L (ref 37–153)
BUN: 21 mg/dL (ref 7–25)
CO2: 30 mmol/L (ref 20–32)
Calcium: 9.3 mg/dL (ref 8.6–10.4)
Chloride: 103 mmol/L (ref 98–110)
Creat: 0.88 mg/dL (ref 0.60–1.00)
Globulin: 2.2 g/dL (ref 1.9–3.7)
Glucose, Bld: 104 mg/dL — ABNORMAL HIGH (ref 65–99)
Potassium: 4.1 mmol/L (ref 3.5–5.3)
Sodium: 141 mmol/L (ref 135–146)
Total Bilirubin: 1.2 mg/dL (ref 0.2–1.2)
Total Protein: 6.7 g/dL (ref 6.1–8.1)
eGFR: 67 mL/min/1.73m2 (ref >=60)

## 2023-03-15 LAB — LIPID PANEL
Cholesterol: 133 mg/dL (ref <200)
HDL: 32 mg/dL — ABNORMAL LOW (ref >=50)
LDL Cholesterol (Calc): 70 mg/dL (calc)
Non-HDL Cholesterol (Calc): 101 mg/dL (calc) (ref <130)
Total CHOL/HDL Ratio: 4.2 (calc) (ref <5.0)
Triglycerides: 214 mg/dL — ABNORMAL HIGH (ref <150)

## 2023-03-17 ENCOUNTER — Other Ambulatory Visit: Payer: Self-pay | Admitting: Family Medicine

## 2023-03-17 DIAGNOSIS — I10 Essential (primary) hypertension: Secondary | ICD-10-CM

## 2023-03-17 DIAGNOSIS — E782 Mixed hyperlipidemia: Secondary | ICD-10-CM

## 2023-03-17 MED ORDER — TELMISARTAN 80 MG PO TABS: 80.0000 mg | ORAL_TABLET | Freq: Every day | ORAL | 1 refills | Status: DC

## 2023-03-17 MED ORDER — ROSUVASTATIN CALCIUM 20 MG PO TABS: 20.0000 mg | ORAL_TABLET | Freq: Every day | ORAL | 3 refills | Status: DC

## 2023-03-17 MED ORDER — HYDROCHLOROTHIAZIDE 12.5 MG PO CAPS: 12.5000 mg | ORAL_CAPSULE | Freq: Every day | ORAL | 1 refills | Status: DC

## 2023-03-26 ENCOUNTER — Ambulatory Visit
Admission: RE | Admit: 2023-03-26 | Discharge: 2023-03-26 | Disposition: A | Discharge status: Home / Self Care | Payer: Medicare Other | Source: Ambulatory Visit | Attending: Family Medicine | Admitting: Family Medicine

## 2023-03-26 DIAGNOSIS — Z1231 Encounter for screening mammogram for malignant neoplasm of breast: Secondary | ICD-10-CM

## 2023-04-16 ENCOUNTER — Ambulatory Visit: Payer: Medicare Other | Admitting: Dermatology

## 2023-04-16 VITALS — BP 129/65 | HR 66

## 2023-04-16 DIAGNOSIS — L578 Other skin changes due to chronic exposure to nonionizing radiation: Secondary | ICD-10-CM

## 2023-04-16 DIAGNOSIS — Z1283 Encounter for screening for malignant neoplasm of skin: Secondary | ICD-10-CM

## 2023-04-16 DIAGNOSIS — L817 Pigmented purpuric dermatosis: Secondary | ICD-10-CM

## 2023-04-16 DIAGNOSIS — L821 Other seborrheic keratosis: Secondary | ICD-10-CM

## 2023-04-16 DIAGNOSIS — D229 Melanocytic nevi, unspecified: Secondary | ICD-10-CM

## 2023-04-16 DIAGNOSIS — L719 Rosacea, unspecified: Secondary | ICD-10-CM

## 2023-04-16 DIAGNOSIS — I8393 Asymptomatic varicose veins of bilateral lower extremities: Secondary | ICD-10-CM | POA: Diagnosis not present

## 2023-04-16 DIAGNOSIS — L280 Lichen simplex chronicus: Secondary | ICD-10-CM | POA: Diagnosis not present

## 2023-04-16 DIAGNOSIS — X32XXXA Exposure to sunlight, initial encounter: Secondary | ICD-10-CM

## 2023-04-16 DIAGNOSIS — L853 Xerosis cutis: Secondary | ICD-10-CM

## 2023-04-16 DIAGNOSIS — L820 Inflamed seborrheic keratosis: Secondary | ICD-10-CM | POA: Diagnosis not present

## 2023-04-16 DIAGNOSIS — W908XXA Exposure to other nonionizing radiation, initial encounter: Secondary | ICD-10-CM

## 2023-04-16 DIAGNOSIS — D1801 Hemangioma of skin and subcutaneous tissue: Secondary | ICD-10-CM | POA: Diagnosis not present

## 2023-04-16 DIAGNOSIS — L814 Other melanin hyperpigmentation: Secondary | ICD-10-CM

## 2023-04-16 MED ORDER — METRONIDAZOLE 0.75 % EX CREA
TOPICAL_CREAM | CUTANEOUS | 5 refills | Status: AC
Start: 2023-04-16 — End: ?

## 2023-04-16 NOTE — Progress Notes (Signed)
Follow-Up Visit   Subjective  Susan Summers is a 82 y.o. female who presents for the following: Skin Cancer Screening and Full Body Skin Exam  The patient presents for Total-Body Skin Exam (TBSE) for skin cancer screening and mole check. The patient has spots, moles and lesions to be evaluated, some may be new or changing. She has rosacea and uses metronidazole 0.75% cream daily. History of LSC on the left lower leg improved, not using clobetasol cream now.     The following portions of the chart were reviewed this encounter and updated as appropriate: medications, allergies, medical history  Review of Systems:  No other skin or systemic complaints except as noted in HPI or Assessment and Plan.  Objective  Well appearing patient in no apparent distress; mood and affect are within normal limits.  A full examination was performed including scalp, head, eyes, ears, nose, lips, neck, chest, axillae, abdomen, back, buttocks, bilateral upper extremities, bilateral lower extremities, hands, feet, fingers, toes, fingernails, and toenails. All findings within normal limits unless otherwise noted below.   Relevant physical exam findings are noted in the Assessment and Plan.  Right Wrist x 1 Erythematous stuck-on, waxy papule   bil foot dorsum Yellow-orange macules on the bilateral foot dorsum.    Assessment & Plan   LENTIGINES, SEBORRHEIC KERATOSES, HEMANGIOMAS - Benign normal skin lesions - Benign-appearing - Call for any changes  MELANOCYTIC NEVI - Tan-brown and/or pink-flesh-colored symmetric macules and papules - Benign appearing on exam today - Observation - Call clinic for new or changing moles - Recommend daily use of broad spectrum spf 30+ sunscreen to sun-exposed areas.   ACTINIC DAMAGE - Chronic condition, secondary to cumulative UV/sun exposure - diffuse scaly erythematous macules with underlying dyspigmentation - Recommend daily broad spectrum sunscreen SPF 30+ to  sun-exposed areas, reapply every 2 hours as needed.  - Staying in the shade or wearing long sleeves, sun glasses (UVA+UVB protection) and wide brim hats (4-inch brim around the entire circumference of the hat) are also recommended for sun protection.  - Call for new or changing lesions.  SKIN CANCER SCREENING PERFORMED TODAY.  LICHEN SIMPLEX CHRONICUS Exam:  Left lower leg clear.   Chronic condition with duration or expected duration over one year. Currently well-controlled.   Treatment Plan: Rash has resolved. d/c clobetasol cream. Restart if flares.   Varicose Veins/Spider Veins - Dilated blue, purple or red veins at the lower extremities - Reassured - Smaller vessels can be treated by sclerotherapy (a procedure to inject a medicine into the veins to make them disappear) if desired, but the treatment is not covered by insurance. Larger vessels may be covered if symptomatic and we would refer to vascular surgeon if treatment desired.  Xerosis - diffuse xerotic patches on the legs - recommend gentle, hydrating skin care - gentle skin care handout given  ROSACEA Exam Pink papules on the cheeks, mild erythema on the cheeks, nose.  Chronic and persistent condition with duration or expected duration over one year. Condition is symptomatic/ bothersome to patient. Not currently at goal, but improving.    Rosacea is a chronic progressive skin condition usually affecting the face of adults, causing redness and/or acne bumps. It is treatable but not curable. It sometimes affects the eyes (ocular rosacea) as well. It may respond to topical and/or systemic medication and can flare with stress, sun exposure, alcohol, exercise, topical steroids (including hydrocortisone/cortisone 10) and some foods.  Daily application of broad spectrum spf 30+ sunscreen to  face is recommended to reduce flares.  Patient denies grittiness of the eyes.  Treatment Plan Continue metronidazole 0.75% cream apply to  face QD for rosacea. Use BID for flares.    Inflamed seborrheic keratosis Right Wrist x 1  Symptomatic, irritating, patient would like treated.  Destruction of lesion - Right Wrist x 1  Destruction method: cryotherapy   Informed consent: discussed and consent obtained   Lesion destroyed using liquid nitrogen: Yes   Region frozen until ice ball extended beyond lesion: Yes   Outcome: patient tolerated procedure well with no complications   Post-procedure details: wound care instructions given   Additional details:  Prior to procedure, discussed risks of blister formation, small wound, skin dyspigmentation, or rare scar following cryotherapy. Recommend Vaseline ointment to treated areas while healing.   Schamberg's purpura bil foot dorsum  Chronic and persistent condition with duration or expected duration over one year. Condition is bothersome/symptomatic for patient. Currently flared.   Benign. Observation. Discussed compression socks. Wynelle Link can worsen. Keep feet elevated when seated.  Stasis in the legs causes chronic leg swelling, which may result in itchy or painful rashes, skin discoloration, skin texture changes, and sometimes ulceration.  Recommend daily graduated compression hose/stockings- easiest to put on first thing in morning, remove at bedtime.  Elevate legs as much as possible. Avoid salt/sodium rich foods.   Rosacea  Related Medications metroNIDAZOLE (METROCREAM) 0.75 % cream Apply to mid face every night for Rosacea. May use twice daily for flares.   Return in about 1 year (around 04/15/2024) for TBSE.  ICherlyn Labella, CMA, am acting as scribe for Willeen Niece, MD .  Documentation: I have reviewed the above documentation for accuracy and completeness, and I agree with the above.  Willeen Niece, MD

## 2023-04-16 NOTE — Patient Instructions (Addendum)
Cryotherapy Aftercare  Wash gently with soap and water everyday.   Apply Vaseline and Band-Aid daily until healed.    Gentle Skin Care Guide  1. Bathe no more than once a day.  2. Avoid bathing in hot water  3. Use a mild soap like Dove, Vanicream, Cetaphil, CeraVe. Can use Lever 2000 or Cetaphil antibacterial soap  4. Use soap only where you need it. On most days, use it under your arms, between your legs, and on your feet. Let the water rinse other areas unless visibly dirty.  5. When you get out of the bath/shower, use a towel to gently blot your skin dry, don't rub it.  6. While your skin is still a little damp, apply a moisturizing cream such as Vanicream, CeraVe, Cetaphil, Eucerin, Sarna lotion or plain Vaseline Jelly. For hands apply Neutrogena [place] Hand Cream or Excipial Hand Cream.  7. Reapply moisturizer any time you start to itch or feel dry.  8. Sometimes using free and clear laundry detergents can be helpful. Fabric softener sheets should be avoided. Downy Free & Gentle liquid, or any liquid fabric softener that is free of dyes and perfumes, it acceptable to use  9. If your doctor has given you prescription creams you may apply moisturizers over them    Rosacea  What is rosacea? Rosacea (say: ro-zay-sha) is a common skin disease that usually begins as a trend of flushing or blushing easily.  As rosacea progresses, a persistent redness in the center of the face will develop and may gradually spread beyond the nose and cheeks to the forehead and chin.  In some cases, the ears, chest, and back could be affected.  Rosacea may appear as tiny blood vessels or small red bumps that occur in crops.  Frequently they can contain pus, and are called "pustules".  If the bumps do not contain pus, they are referred to as "papules".  Rarely, in prolonged, untreated cases of rosacea, the oil glands of the nose and cheeks may become permanently enlarged.  This is called rhinophyma, and  is seen more frequently in men.  Signs and Risks In its beginning stages, rosacea tends to come and go, which makes it difficult to recognize.  It can start as intermittent flushing of the face.  Eventually, blood vessels may become permanently visible.  Pustules and papules can appear, but can be mistaken for adult acne.  People of all races, ages, genders and ethnic groups are at risk of developing rosacea.  However, it is more common in women (especially around menopause) and adults with fair skin between the ages of 25 and 32.  Treatment Dermatologists typically recommend a combination of treatments to effectively manage rosacea.  Treatment can improve symptoms and may stop the progression of the rosacea.  Treatment may involve both topical and oral medications.  The tetracycline antibiotics are often used for their anti-inflammatory effect; however, because of the possibility of developing antibiotic resistance, they should not be used long term at full dose.  For dilated blood vessels the options include electrodessication (uses electric current through a small needle), laser treatment, and cosmetics to hide the redness.   With all forms of treatment, improvement is a slow process, and patients may not see any results for the first 3-4 weeks.  It is very important to avoid the sun and other triggers.  Patients must wear sunscreen daily.  Skin Care Instructions: Cleanse the skin with a mild soap such as CeraVe cleanser, Cetaphil cleanser, or  Dove soap once or twice daily as needed. Moisturize with Eucerin Redness Relief Daily Perfecting Lotion (has a subtle green tint), CeraVe Moisturizing Cream, or Oil of Olay Daily Moisturizer with sunscreen every morning and/or night as recommended. Makeup should be "non-comedogenic" (won't clog pores) and be labeled "for sensitive skin". Good choices for cosmetics are: Neutrogena, Almay, and Physician's Formula.  Any product with a green tint tends to offset a  red complexion. If your eyes are dry and irritated, use artificial tears 2-3 times per day and cleanse the eyelids daily with baby shampoo.  Have your eyes examined at least every 2 years.  Be sure to tell your eye doctor that you have rosacea. Alcoholic beverages tend to cause flushing of the skin, and may make rosacea worse. Always wear sunscreen, protect your skin from extreme hot and cold temperatures, and avoid spicy foods, hot drinks, and mechanical irritation such as rubbing, scrubbing, or massaging the face.  Avoid harsh skin cleansers, cleansing masks, astringents, and exfoliation. If a particular product burns or makes your face feel tight, then it is likely to flare your rosacea. If you are having difficulty finding a sunscreen that you can tolerate, you may try switching to a chemical-free sunscreen.  These are ones whose active ingredient is zinc oxide or titanium dioxide only.  They should also be fragrance free, non-comedogenic, and labeled for sensitive skin. Rosacea triggers may vary from person to person.  There are a variety of foods that have been reported to trigger rosacea.  Some patients find that keeping a diary of what they were doing when they flared helps them avoid triggers.   Due to recent changes in healthcare laws, you may see results of your pathology and/or laboratory studies on MyChart before the doctors have had a chance to review them. We understand that in some cases there may be results that are confusing or concerning to you. Please understand that not all results are received at the same time and often the doctors may need to interpret multiple results in order to provide you with the best plan of care or course of treatment. Therefore, we ask that you please give [place] 2 business days to thoroughly review all your results before contacting the office for clarification. Should we see a critical lab result, you will be contacted sooner.   If You Need Anything After Your  Visit  If you have any questions or concerns for your doctor, please call our main line at [phone] and press option 4 to reach your doctor's medical assistant. If no one answers, please leave a voicemail as directed and we will return your call as soon as possible. Messages left after 4 pm will be answered the following business day.   You may also send [place] a message via MyChart. We typically respond to MyChart messages within 1-2 business days.  For prescription refills, please ask your pharmacy to contact our office. Our fax number is [phone].  If you have an urgent issue when the clinic is closed that cannot wait until the next business day, you can page your doctor at the number below.    Please note that while we do our best to be available for urgent issues outside of office hours, we are not available 24/7.   If you have an urgent issue and are unable to reach [place], you may choose to seek medical care at your doctor's office, retail clinic, urgent care center, or emergency room.  If you have  a medical emergency, please immediately call 911 or go to the emergency department.  Pager Numbers  - Dr. Gwen Pounds: [phone]  - Dr. Neale Burly: [phone]  - Dr. Roseanne Reno: [phone]  In the event of inclement weather, please call our main line at [phone] for an update on the status of any delays or closures.  Dermatology Medication Tips: Please keep the boxes that topical medications come in in order to help keep track of the instructions about where and how to use these. Pharmacies typically print the medication instructions only on the boxes and not directly on the medication tubes.   If your medication is too expensive, please contact our office at [phone] option 4 or send [place] a message through MyChart.   We are unable to tell what your co-pay for medications will be in advance as this is different depending on your insurance coverage. However, we may be able to find a  substitute medication at lower cost or fill out paperwork to get insurance to cover a needed medication.   If a prior authorization is required to get your medication covered by your insurance company, please allow [place] 1-2 business days to complete this process.  Drug prices often vary depending on where the prescription is filled and some pharmacies may offer cheaper prices.  The website www.goodrx.com contains coupons for medications through different pharmacies. The prices here do not account for what the cost may be with help from insurance (it may be cheaper with your insurance), but the website can give you the price if you did not use any insurance.  - You can print the associated coupon and take it with your prescription to the pharmacy.  - You may also stop by our office during regular business hours and pick up a GoodRx coupon card.  - If you need your prescription sent electronically to a different pharmacy, notify our office through [facility] or by phone at [phone] option 4.     Si Usted Necesita Algo Despus de Su Visita  Tambin puede enviarnos un mensaje a travs de Clinical cytogeneticist. Por lo general respondemos a los mensajes de MyChart en el transcurso de 1 a 2 das hbiles.  Para renovar recetas, por favor pida a su farmacia que se ponga en contacto con nuestra oficina. Annie Sable de fax es [phone].  Si tiene un asunto urgente cuando la clnica est cerrada y que no puede esperar hasta el siguiente da hbil, puede llamar/localizar a su doctor(a) al nmero que aparece a continuacin.   Por favor, tenga en cuenta que aunque hacemos todo lo posible para estar disponibles para asuntos urgentes fuera del horario de [place], no estamos disponibles las 24 horas del da, los 7 [address]  [address] de la [place].   Si tiene un problema urgente y no puede comunicarse con nosotros, puede optar por buscar atencin mdica  en el consultorio de su doctor(a), en una clnica privada, en un  centro de atencin urgente o en una sala de emergencias.  Si tiene Engineer, drilling, por favor llame inmediatamente al 911 o vaya a la sala de emergencias.  Nmeros de bper  - Dr. Gwen Pounds: [phone]  - Dra. Moye: [phone]  - Dra. Roseanne Reno: [phone]  En caso de inclemencias del [place], por favor llame a Lacy Duverney principal al [phone] para una actualizacin sobre el [place] de cualquier retraso o cierre.  Consejos para la medicacin en dermatologa: Por favor, guarde las cajas en las que vienen los medicamentos de uso tpico para  ayudarle a seguir las [employer] dnde y cmo usarlos. Las farmacias generalmente imprimen las instrucciones del medicamento slo en las cajas y no directamente en los tubos del [place].   Si su medicamento es muy caro, por favor, pngase en contacto con Rolm Gala llamando al [phone] y presione la opcin 4 o envenos un mensaje a travs de Clinical cytogeneticist.   No podemos decirle cul ser su copago por los medicamentos por adelantado ya que esto es diferente dependiendo de la cobertura de su seguro. Sin embargo, es posible que podamos encontrar un medicamento sustituto a Audiological scientist un formulario para que el seguro cubra el medicamento que se considera necesario.   Si se requiere una autorizacin previa para que su compaa de seguros [place] su medicamento, por favor permtanos de 1 a 2 das hbiles para completar [address]  Los precios de los medicamentos varan con frecuencia dependiendo del Environmental consultant de dnde se surte la receta y alguna farmacias pueden ofrecer precios ms baratos.  El sitio web www.goodrx.com tiene cupones para medicamentos de Health and safety inspector. Los precios aqu no tienen en cuenta lo que podra costar con la ayuda del seguro (puede ser ms barato con su seguro), pero el sitio web puede darle el precio si no utiliz Tourist information centre manager.  - Puede imprimir el cupn correspondiente y llevarlo con su receta  a la farmacia.  - Tambin puede pasar por nuestra oficina durante el horario de atencin regular y Education officer, museum una tarjeta de cupones de GoodRx.  - Si necesita que su receta se enve electrnicamente a una farmacia diferente, informe a nuestra oficina a travs de MyChart de [place] o por telfono llamando al [phone] y presione la opcin 4.

## 2023-06-10 ENCOUNTER — Encounter: Payer: Medicare Other | Admitting: Dermatology

## 2023-07-10 ENCOUNTER — Telehealth: Payer: Self-pay | Admitting: Family Medicine

## 2023-07-10 NOTE — Telephone Encounter (Signed)
Called pharmacy prior speaking to patient. Spoke to [employer] he stated pt picked up last crestor prescription on 05/29/23. If she misplaced they could fill early have pt to call him.   Called pt and informed her of what Thayer Ohm stated to me and she stated she never picked up the prescription from last month, pharmacy staff must have given it to someone else. Pt has looked around where she normally has all her meds there is no bottle of crestor. I advised pt to call back pharmacy and speak to [facility] for an early refill, she might have to pay out of pocket. Pt verbalized understanding

## 2023-07-10 NOTE — Telephone Encounter (Signed)
Copied from CRM [id number]. Topic: General - Other >> Jul 10, 2023  8:44 AM Franchot Heidelberg wrote: Reason for CRM: Pt called reporting that she contacted her pharmacy for more refills of crestor and they told her that she does not have any refills left. She is almost out of her current supply. She is asking for someone in the clinic to call her pharmacy and resolve this for her.

## 2023-09-16 ENCOUNTER — Ambulatory Visit (INDEPENDENT_AMBULATORY_CARE_PROVIDER_SITE_OTHER): Payer: Medicare Other | Admitting: Physician Assistant

## 2023-09-16 ENCOUNTER — Encounter: Payer: Self-pay | Admitting: Physician Assistant

## 2023-09-16 ENCOUNTER — Other Ambulatory Visit: Payer: Self-pay | Admitting: Family Medicine

## 2023-09-16 VITALS — BP 112/66 | HR 73 | Temp 96.2°F | Resp 16 | Ht 61.25 in | Wt 184.3 lb

## 2023-09-16 DIAGNOSIS — R7303 Prediabetes: Secondary | ICD-10-CM

## 2023-09-16 DIAGNOSIS — I10 Essential (primary) hypertension: Secondary | ICD-10-CM | POA: Diagnosis not present

## 2023-09-16 DIAGNOSIS — E66811 Obesity, class 1: Secondary | ICD-10-CM | POA: Diagnosis not present

## 2023-09-16 DIAGNOSIS — E782 Mixed hyperlipidemia: Secondary | ICD-10-CM | POA: Diagnosis not present

## 2023-09-16 DIAGNOSIS — Z6834 Body mass index (BMI) 34.0-34.9, adult: Secondary | ICD-10-CM

## 2023-09-16 MED ORDER — HYDROCHLOROTHIAZIDE 12.5 MG PO CAPS: 12.5000 mg | ORAL_CAPSULE | Freq: Every day | ORAL | 0 refills | Status: DC

## 2023-09-16 NOTE — Assessment & Plan Note (Signed)
Chronic, historic condition Appears well managed with current regimen comprised of hydrochlorothiazide 12.5 mg PO every day and Telmisartan 80 mg  po every day Continue current regimen Continue home monitoring  Follow up in 6 months or sooner if concerns arise

## 2023-09-16 NOTE — Progress Notes (Signed)
Established Patient Office Visit  Name: Susan Summers   MRN: [id number]    DOB: 09/03/1941   Date:09/16/2023  Today's Provider: Jacquelin Hawking, MHS, PA-C Introduced myself to the patient as a PA-C and provided education on APPs in clinical practice.         Subjective  Chief Complaint  Chief Complaint  Patient presents with   Hypertension   Hyperlipidemia   Pre DM    HPI   HYPERTENSION / HYPERLIPIDEMIA Satisfied with current treatment? yes Duration of hypertension: years BP monitoring frequency: a few times a week BP range: 110s/60-70s  BP medication side effects: no Past BP meds: Telmisartan 80 mg PO every day, hydrochlorothiazide 12.5 mg PO every day  Duration of hyperlipidemia: years Cholesterol medication side effects: no Cholesterol supplements: none Past cholesterol medications: rosuvastatin (crestor) Medication compliance: good compliance Aspirin: no Recent stressors: no Recurrent headaches: no Visual changes: no Palpitations: no Dyspnea: no Chest pain: no Lower extremity edema: sometimes if she is on feet a lot Dizzy/lightheaded: no  PREDIABETES She does not check sugars at home She reports that she does like sweets but she is trying to reduce her intake She does try to follow low sodium diet as well She tries to stay active with shopping and doing things around the house She [facility]    Osteopenia DEXA scan from 2022 demonstrates normal results    Patient Active Problem List   Diagnosis Date Noted   History of prediabetes 03/14/2022   Bilateral carpal tunnel syndrome 09/20/2020   Class 1 obesity with serious comorbidity and body mass index (BMI) of 34.0 to 34.9 in adult 08/07/2018   Cramp in muscle 02/04/2018   H/O malignant neoplasm of skin 02/04/2018   Osteopenia of the elderly 02/04/2018   Post menopausal syndrome 02/04/2018   Chronic lower back pain 02/04/2018   Prediabetes 11/13/2016    Benign hypertension 04/25/2015   Hyperlipidemia 04/25/2015   Plantar fasciitis 04/25/2015    Past Surgical History:  Procedure Laterality Date   ABDOMINAL HYSTERECTOMY     APPENDECTOMY     BREAST BIOPSY Left 10/20/2018   [place] bx, path pending   HERNIA REPAIR     oopherrectomy  2012   UMBILICAL HERNIA REPAIR  2012    Family History  Problem Relation Age of Onset   Hypertension Mother    Stroke Mother    Rheum arthritis Mother    Diabetes Father    Cancer Sister        Ovarian   Diabetes Sister    Hyperlipidemia Sister    Cancer Sister        skin- SCC removed   Melanoma Daughter    Hypertension Brother    Stroke Brother    Cancer Brother        Prostate   Other Brother        bowel obstruction   Hypertension Son    Hyperlipidemia Son    Breast cancer Neg Hx     Social History   Tobacco Use   Smoking status: Never   Smokeless tobacco: Never   Tobacco comments:    smoking cessation materials not required  Substance Use Topics   Alcohol use: No    Alcohol/week: 0.0 standard drinks of alcohol     Current Outpatient Medications:    Cholecalciferol (VITAMIN D PO), Take 4,000 Units by mouth daily. , Disp: , Rfl:  clobetasol cream (TEMOVATE) 0.05 %, Apply twice daily to leg for 2 weeks then decrease to once daily for 2 weeks (if still itching), Disp: 60 g, Rfl: 1   diphenhydrAMINE HCl (NERVINE PO), Take by mouth., Disp: , Rfl:    ELDERBERRY PO, Take 1,250 mg by mouth daily. , Disp: , Rfl:    Garlic 100 MG TABS, Take by mouth daily., Disp: , Rfl:    meloxicam (MOBIC) 15 MG tablet, Take 0.5-1 tablets (7.5-15 mg total) by mouth daily. PRN, Disp: 30 tablet, Rfl: 2   metroNIDAZOLE (METROCREAM) 0.75 % cream, Apply to mid face every night for Rosacea. May use twice daily for flares., Disp: 45 g, Rfl: 5   Probiotic Product (PROBIOTIC DAILY PO), Take by mouth daily. , Disp: , Rfl:    rosuvastatin (CRESTOR) 20 MG tablet, Take 1 tablet (20 mg total) by mouth at bedtime.,  Disp: 90 tablet, Rfl: 3   telmisartan (MICARDIS) 80 MG tablet, TAKE ONE TABLET (80 MG TOTAL) BY MOUTH DAILY., Disp: 90 tablet, Rfl: 0   hydrochlorothiazide (MICROZIDE) 12.5 MG capsule, Take 1 capsule (12.5 mg total) by mouth daily., Disp: 90 capsule, Rfl: 0  Allergies  Allergen Reactions   Nickel    Penicillins    Red Dye #40 (Allura Red)     Prior allergy to med with red dye in it, contact dermatitis with yarn     I personally reviewed active problem list, medication list, allergies, health maintenance, notes from last encounter, lab results with the patient/caregiver today.   ROS  Please see HPI for relevant ROS   Objective  Vitals:   09/16/23 1047  BP: 112/66  Pulse: 73  Resp: 16  Temp: (!) 96.2 F (35.7 C)  TempSrc: Temporal  SpO2: 95%  Weight: 184 lb 4.8 oz (83.6 kg)  Height: 5' 1.25" (1.556 m)    Body mass index is 34.54 kg/m.  Physical Exam Vitals reviewed.  Constitutional:      General: She is awake.     Appearance: Normal appearance. She is well-developed and well-groomed.  HENT:     Head: Normocephalic and atraumatic.  Cardiovascular:     Rate and Rhythm: Normal rate and regular rhythm.     Pulses: Normal pulses.          Radial pulses are 2+ on the right side and 2+ on the left side.     Heart sounds: Normal heart sounds. No murmur heard.    No friction rub. No gallop.  Pulmonary:     Effort: Pulmonary effort is normal.     Breath sounds: Normal breath sounds. No decreased air movement. No decreased breath sounds, wheezing, rhonchi or rales.  Musculoskeletal:     Cervical back: Normal range of motion.     Right lower leg: No edema.     Left lower leg: No edema.  Neurological:     General: No focal deficit present.     Mental Status: She is alert and oriented to person, place, and time. Mental status is at baseline.     GCS: GCS eye subscore is 4. GCS verbal subscore is 5. GCS motor subscore is 6.  Psychiatric:        Attention and Perception:  Attention and perception normal.        Mood and Affect: Mood and affect normal.        Speech: Speech normal.        Behavior: Behavior normal. Behavior is cooperative.  Thought Content: Thought content normal.        Cognition and Memory: Cognition normal.      No results found for this or any previous visit (from the past 2160 hour(s)).   PHQ2/9:    09/16/2023   10:46 AM 03/14/2023    8:50 AM 02/21/2023    1:41 PM 09/25/2022    8:31 AM 09/13/2022    8:58 AM  Depression screen PHQ 2/9  Decreased Interest 0 0 0 0 0  Down, Depressed, Hopeless 0 0 0 0 0  PHQ - 2 Score 0 0 0 0 0  Altered sleeping 0 0  0 0  Tired, decreased energy 0 0  0 0  Change in appetite 0 0  0 0  Feeling bad or failure about yourself  0 0  0 0  Trouble concentrating 0 0  0 0  Moving slowly or fidgety/restless 0 0  0 0  Suicidal thoughts 0 0  0 0  PHQ-9 Score 0 0  0 0  Difficult doing work/chores Not difficult at all Not difficult at all  Not difficult at all Not difficult at all      Fall Risk:    09/16/2023   10:46 AM 03/14/2023    8:50 AM 02/21/2023    1:38 PM 09/25/2022    8:31 AM 09/13/2022    8:58 AM  Fall Risk   Falls in the past year? 0 0 0 0 0  Number falls in past yr: 0 0 0 0 0  Injury with Fall? 0 0 0 0 0  Risk for fall due to : No Fall Risks No Fall Risks No Fall Risks No Fall Risks No Fall Risks  Follow up Falls prevention discussed;Education provided;Falls evaluation completed Falls prevention discussed;Education provided;Falls evaluation completed Falls prevention discussed;Education provided Falls prevention discussed;Education provided;Falls evaluation completed Falls prevention discussed;Education provided;Falls evaluation completed      Functional Status Survey: Is the patient deaf or have difficulty hearing?: No Does the patient have difficulty seeing, even when wearing glasses/contacts?: No Does the patient have difficulty concentrating, remembering, or making decisions?:  No Does the patient have difficulty walking or climbing stairs?: No Does the patient have difficulty dressing or bathing?: No Does the patient have difficulty doing errands alone such as visiting a doctor's office or shopping?: No    Assessment & Plan  Problem List Items Addressed This Visit       Cardiovascular and Mediastinum   Benign hypertension - Primary (Chronic)    Chronic, historic condition Appears well managed with current regimen comprised of hydrochlorothiazide 12.5 mg PO every day and Telmisartan 80 mg  po every day Continue current regimen Continue home monitoring  Follow up in 6 months or sooner if concerns arise        Relevant Medications   hydrochlorothiazide (MICROZIDE) 12.5 MG capsule   Other Relevant Orders   COMPLETE METABOLIC PANEL WITH GFR   CBC w/Diff/Platelet     Other   Hyperlipidemia (Chronic)    Chronic, historic condition Appears well managed on current regimen comprised of Rosuvastatin 40 mg PO every day Continue current regimen Recheck lipid panel. Results to dictate further management  Follow up in 6 months or sooner if concerns arise        Relevant Medications   hydrochlorothiazide (MICROZIDE) 12.5 MG capsule   Other Relevant Orders   Lipid Profile   Prediabetes (Chronic)    Chronic, ongoing Recheck A1c today. Results to dictate further management  Follow up in 6 months or sooner if concerns arise        Relevant Orders   HgB A1c   Class 1 obesity with serious comorbidity and body mass index (BMI) of 34.0 to 34.9 in adult    Recommend diet and exercise efforts to assist with management Recommend reducing excess sugar and saturated fats to assist with HLD and prediabetes management Follow up in 6 months or sooner if concerns arise          Return in about 6 months (around 03/15/2024) for HLD, HTN, Annual physical, prediabetes .   I, Mikyle Sox E Thomos Domine, PA-C, have reviewed all documentation for this visit. The documentation on  09/16/23 for the exam, diagnosis, procedures, and orders are all accurate and complete.   Jacquelin Hawking, MHS, PA-C [facility]

## 2023-09-16 NOTE — Patient Instructions (Addendum)
Please try to wear compression stockings to help with the leg swelling  Use your shoe size and then measure around the widest part of your calf to get the appropriate size stocking I typically recommend 15-25 mmHg compression force for daily wear. Please put these on first thing in the morning and wear until the evening time.  These should not be painful or cut into your legs but they may be pretty snug. If they cause pain, please take them off.  You can get them through a medical supply store or from a website called [employer]   It was nice to meet you and I appreciate the opportunity to be involved in your care If you were satisfied with the care you received from me, I would greatly appreciate you saying so in the after-visit survey that is sent out following our visit.

## 2023-09-16 NOTE — Assessment & Plan Note (Signed)
Chronic, ongoing Recheck A1c today. Results to dictate further management  Follow up in 6 months or sooner if concerns arise

## 2023-09-16 NOTE — Assessment & Plan Note (Signed)
Recommend diet and exercise efforts to assist with management Recommend reducing excess sugar and saturated fats to assist with HLD and prediabetes management Follow up in 6 months or sooner if concerns arise

## 2023-09-16 NOTE — Assessment & Plan Note (Signed)
Chronic, historic condition Appears well managed on current regimen comprised of Rosuvastatin 40 mg PO every day Continue current regimen Recheck lipid panel. Results to dictate further management  Follow up in 6 months or sooner if concerns arise

## 2023-09-17 LAB — CBC WITH DIFFERENTIAL/PLATELET
Absolute Lymphocytes: 1365 {cells}/uL (ref 850–3900)
Absolute Monocytes: 525 {cells}/uL (ref 200–950)
Basophils Absolute: 42 {cells}/uL (ref 0–200)
Basophils Relative: 0.6 %
Eosinophils Absolute: 182 {cells}/uL (ref 15–500)
Eosinophils Relative: 2.6 %
HCT: 39 % (ref 35.0–45.0)
Hemoglobin: 12.7 g/dL (ref 11.7–15.5)
MCH: 28.3 pg (ref 27.0–33.0)
MCHC: 32.6 g/dL (ref 32.0–36.0)
MCV: 87.1 fL (ref 80.0–100.0)
MPV: 10.8 fL (ref 7.5–12.5)
Monocytes Relative: 7.5 %
Neutro Abs: 4886 {cells}/uL (ref 1500–7800)
Neutrophils Relative %: 69.8 %
Platelets: 199 10*3/uL (ref 140–400)
RBC: 4.48 10*6/uL (ref 3.80–5.10)
RDW: 12.6 % (ref 11.0–15.0)
Total Lymphocyte: 19.5 %
WBC: 7 10*3/uL (ref 3.8–10.8)

## 2023-09-17 LAB — LIPID PANEL
Cholesterol: 140 mg/dL (ref <200)
HDL: 37 mg/dL — ABNORMAL LOW (ref >=50)
LDL Cholesterol (Calc): 76 mg/dL
Non-HDL Cholesterol (Calc): 103 mg/dL (ref <130)
Total CHOL/HDL Ratio: 3.8 (calc) (ref <5.0)
Triglycerides: 173 mg/dL — ABNORMAL HIGH (ref <150)

## 2023-09-17 LAB — HEMOGLOBIN A1C
Hgb A1c MFr Bld: 5.9 %{Hb} — ABNORMAL HIGH (ref <5.7)
Mean Plasma Glucose: 123 mg/dL
eAG (mmol/L): 6.8 mmol/L

## 2023-09-17 LAB — COMPLETE METABOLIC PANEL WITH GFR
AG Ratio: 1.9 (calc) (ref 1.0–2.5)
ALT: 16 U/L (ref 6–29)
AST: 15 U/L (ref 10–35)
Albumin: 4.3 g/dL (ref 3.6–5.1)
Alkaline phosphatase (APISO): 76 U/L (ref 37–153)
BUN: 20 mg/dL (ref 7–25)
CO2: 32 mmol/L (ref 20–32)
Calcium: 9.6 mg/dL (ref 8.6–10.4)
Chloride: 102 mmol/L (ref 98–110)
Creat: 0.89 mg/dL (ref 0.60–0.95)
Globulin: 2.3 g/dL (ref 1.9–3.7)
Glucose, Bld: 102 mg/dL — ABNORMAL HIGH (ref 65–99)
Potassium: 4.6 mmol/L (ref 3.5–5.3)
Sodium: 140 mmol/L (ref 135–146)
Total Bilirubin: 1 mg/dL (ref 0.2–1.2)
Total Protein: 6.6 g/dL (ref 6.1–8.1)
eGFR: 65 mL/min/{1.73_m2} (ref >=60)

## 2023-09-19 NOTE — Progress Notes (Signed)
Your labs are back Your cholesterol looks pretty good.  Your triglycerides seem to have improved since the last time it was checked about 6 months ago Your electrolytes, liver and kidney function are in normal ranges Your A1c is 5.9%.  This is still in prediabetic range Your CBC is overall normal, no signs of anemia Please continue your current medication regimen as directed and let us know if you have any questions or concerns

## 2023-12-31 ENCOUNTER — Other Ambulatory Visit: Payer: Self-pay | Admitting: Family Medicine

## 2023-12-31 DIAGNOSIS — I10 Essential (primary) hypertension: Secondary | ICD-10-CM

## 2024-01-12 DIAGNOSIS — H2513 Age-related nuclear cataract, bilateral: Secondary | ICD-10-CM | POA: Diagnosis not present

## 2024-01-12 DIAGNOSIS — H40003 Preglaucoma, unspecified, bilateral: Secondary | ICD-10-CM | POA: Diagnosis not present

## 2024-02-04 ENCOUNTER — Encounter: Payer: Self-pay | Admitting: Family Medicine

## 2024-02-04 ENCOUNTER — Ambulatory Visit (INDEPENDENT_AMBULATORY_CARE_PROVIDER_SITE_OTHER): Admitting: Family Medicine

## 2024-02-04 VITALS — BP 138/80 | HR 77 | Temp 97.6°F | Resp 16 | Ht 61.25 in | Wt 181.5 lb

## 2024-02-04 DIAGNOSIS — G5603 Carpal tunnel syndrome, bilateral upper limbs: Secondary | ICD-10-CM | POA: Diagnosis not present

## 2024-02-04 DIAGNOSIS — R7303 Prediabetes: Secondary | ICD-10-CM | POA: Diagnosis not present

## 2024-02-04 DIAGNOSIS — E66811 Obesity, class 1: Secondary | ICD-10-CM

## 2024-02-04 DIAGNOSIS — R600 Localized edema: Secondary | ICD-10-CM

## 2024-02-04 DIAGNOSIS — Z5181 Encounter for therapeutic drug level monitoring: Secondary | ICD-10-CM | POA: Diagnosis not present

## 2024-02-04 DIAGNOSIS — E782 Mixed hyperlipidemia: Secondary | ICD-10-CM

## 2024-02-04 DIAGNOSIS — I10 Essential (primary) hypertension: Secondary | ICD-10-CM | POA: Diagnosis not present

## 2024-02-04 DIAGNOSIS — Z1231 Encounter for screening mammogram for malignant neoplasm of breast: Secondary | ICD-10-CM

## 2024-02-04 DIAGNOSIS — Z6834 Body mass index (BMI) 34.0-34.9, adult: Secondary | ICD-10-CM

## 2024-02-04 DIAGNOSIS — M179 Osteoarthritis of knee, unspecified: Secondary | ICD-10-CM | POA: Diagnosis not present

## 2024-02-04 MED ORDER — TELMISARTAN-HCTZ 80-12.5 MG PO TABS: 1.0000 | ORAL_TABLET | Freq: Every morning | ORAL | 1 refills | Status: DC

## 2024-02-04 MED ORDER — MELOXICAM 15 MG PO TABS: 7.5000 mg | ORAL_TABLET | Freq: Every day | ORAL | 2 refills | Status: AC

## 2024-02-04 MED ORDER — ROSUVASTATIN CALCIUM 20 MG PO TABS: 20.0000 mg | ORAL_TABLET | Freq: Every day | ORAL | Status: DC

## 2024-02-04 NOTE — Progress Notes (Signed)
 Name: Susan Summers   MRN: [id number]    DOB: 30-Jul-1941   Date:02/04/2024       Progress Note  Chief Complaint  Patient presents with   Medical Management of Chronic Issues    Subjective:   Susan Summers is a 83 y.o. female, presents to clinic for routine follow up on chronic conditions  Prediabetes, HLD and HTN all fairly stable for years and no recent med changes for HLD and HTN Not on meds for prediabetes  Mammo due in May Dexa due next year - on supplements, dexa scores improved over the past decade  Obesity: weight down a little Wt Readings from Last 5 Encounters:  02/04/24 181 lb 8 oz (82.3 kg)  09/16/23 184 lb 4.8 oz (83.6 kg)  03/14/23 184 lb 8 oz (83.7 kg)  02/21/23 184 lb 1.6 oz (83.5 kg)  09/25/22 193 lb (87.5 kg)   BMI Readings from Last 5 Encounters:  02/04/24 34.01 kg/m  09/16/23 34.54 kg/m  03/14/23 34.58 kg/m  02/21/23 33.67 kg/m  09/25/22 35.30 kg/m   Htn controlled on hydrochlorothiazide and telmisartan  HLD reviewed last labs, no med changes for years  Meloxicam refill - she uses for knee pain and carpal tunnel pain, not using daily, no other NSAID use, does use tylenol   Current Outpatient Medications:    Cholecalciferol (VITAMIN D PO), Take 4,000 Units by mouth daily. , Disp: , Rfl:    clobetasol cream (TEMOVATE) 0.05 %, Apply twice daily to leg for 2 weeks then decrease to once daily for 2 weeks (if still itching), Disp: 60 g, Rfl: 1   diphenhydrAMINE HCl (NERVINE PO), Take by mouth., Disp: , Rfl:    ELDERBERRY PO, Take 1,250 mg by mouth daily. , Disp: , Rfl:    Garlic 100 MG TABS, Take by mouth daily., Disp: , Rfl:    hydrochlorothiazide (MICROZIDE) 12.5 MG capsule, Take 1 capsule (12.5 mg total) by mouth daily., Disp: 90 capsule, Rfl: 0   metroNIDAZOLE (METROCREAM) 0.75 % cream, Apply to mid face every night for Rosacea. May use twice daily for flares., Disp: 45 g, Rfl: 5   Probiotic Product (PROBIOTIC DAILY PO), Take by mouth daily. ,  Disp: , Rfl:    telmisartan (MICARDIS) 80 MG tablet, TAKE ONE TABLET (80 MG TOTAL) BY MOUTH DAILY., Disp: 90 tablet, Rfl: 0   [START ON 04/05/2024] telmisartan-hydrochlorothiazide (MICARDIS HCT) 80-12.5 MG tablet, Take 1 tablet by mouth in the morning., Disp: 90 tablet, Rfl: 1   meloxicam (MOBIC) 15 MG tablet, Take 0.5-1 tablets (7.5-15 mg total) by mouth daily. PRN, Disp: 30 tablet, Rfl: 2   rosuvastatin (CRESTOR) 20 MG tablet, Take 1 tablet (20 mg total) by mouth daily., Disp: , Rfl:   Patient Active Problem List   Diagnosis Date Noted   History of prediabetes 03/14/2022   Bilateral carpal tunnel syndrome 09/20/2020   Class 1 obesity with serious comorbidity and body mass index (BMI) of 34.0 to 34.9 in adult 08/07/2018   Cramp in muscle 02/04/2018   H/O malignant neoplasm of skin 02/04/2018   Osteopenia of the elderly 02/04/2018   Post menopausal syndrome 02/04/2018   Chronic lower back pain 02/04/2018   Prediabetes 11/13/2016   Benign hypertension 04/25/2015   Hyperlipidemia 04/25/2015   Plantar fasciitis 04/25/2015    Past Surgical History:  Procedure Laterality Date   ABDOMINAL HYSTERECTOMY     APPENDECTOMY     BREAST BIOPSY Left 10/20/2018   [place] bx, path pending  HERNIA REPAIR     oopherrectomy  2012   UMBILICAL HERNIA REPAIR  2012    Family History  Problem Relation Age of Onset   Hypertension Mother    Stroke Mother    Rheum arthritis Mother    Diabetes Father    Cancer Sister        Ovarian   Diabetes Sister    Hyperlipidemia Sister    Cancer Sister        skin- SCC removed   Melanoma Daughter    Hypertension Brother    Stroke Brother    Cancer Brother        Prostate   Other Brother        bowel obstruction   Hypertension Son    Hyperlipidemia Son    Breast cancer Neg Hx     Social History   Tobacco Use   Smoking status: Never   Smokeless tobacco: Never   Tobacco comments:    smoking cessation materials not required  Vaping Use   Vaping  status: Never Used  Substance Use Topics   Alcohol use: No    Alcohol/week: 0.0 standard drinks of alcohol   Drug use: No     Allergies  Allergen Reactions   Nickel    Penicillins    Red Dye #40 (Allura Red)     Prior allergy to med with red dye in it, contact dermatitis with yarn     Health Maintenance  Topic Date Due   Medicare Annual Wellness (AWV)  02/21/2024   COVID-19 Vaccine (6 - Pfizer risk 2024-25 season) 03/03/2024   MAMMOGRAM  03/25/2024   DEXA SCAN  06/27/2024   DTaP/Tdap/Td (3 - Td or Tdap) 08/12/2026   Pneumonia Vaccine 45+ Years old  Completed   INFLUENZA VACCINE  Completed   Zoster Vaccines- Shingrix  Completed   HPV VACCINES  Aged Out   Hepatitis C Screening  Discontinued   Fecal DNA (Cologuard)  Discontinued    Chart Review Today: I personally reviewed active problem list, medication list, allergies, family history, social history, health maintenance, notes from last encounter, lab results, imaging with the patient/caregiver today.   Review of Systems  Constitutional: Negative.   HENT: Negative.    Eyes: Negative.   Respiratory: Negative.    Cardiovascular: Negative.   Gastrointestinal: Negative.   Endocrine: Negative.   Genitourinary: Negative.   Musculoskeletal: Negative.   Skin: Negative.   Allergic/Immunologic: Negative.   Neurological: Negative.   Hematological: Negative.   Psychiatric/Behavioral: Negative.    All other systems reviewed and are negative.    Objective:   Vitals:   02/04/24 1041  BP: 138/80  Pulse: 77  Resp: 16  Temp: 97.6 F (36.4 C)  TempSrc: Oral  SpO2: 97%  Weight: 181 lb 8 oz (82.3 kg)  Height: 5' 1.25" (1.556 m)    Body mass index is 34.01 kg/m.  Physical Exam Vitals and nursing note reviewed.  Constitutional:      General: She is not in acute distress.    Appearance: She is well-developed. She is obese. She is not ill-appearing, toxic-appearing or diaphoretic.  HENT:     Head: Normocephalic and  atraumatic.     Right Ear: External ear normal.     Left Ear: External ear normal.  Eyes:     General:        Right eye: No discharge.        Left eye: No discharge.     Conjunctiva/sclera: Conjunctivae normal.  Neck:     Trachea: No tracheal deviation.  Cardiovascular:     Rate and Rhythm: Normal rate and regular rhythm.     Pulses: Normal pulses.     Heart sounds: Normal heart sounds. No murmur heard.    No friction rub. No gallop.  Pulmonary:     Effort: Pulmonary effort is normal. No respiratory distress.     Breath sounds: Normal breath sounds. No stridor. No wheezing, rhonchi or rales.  Musculoskeletal:     Right lower leg: Edema present.     Left lower leg: Edema present.  Skin:    General: Skin is warm and dry.     Findings: No rash.  Neurological:     Mental Status: She is alert.     Motor: No abnormal muscle tone.     Coordination: Coordination normal.  Psychiatric:        Mood and Affect: Mood normal.        Behavior: Behavior normal.      Functional Status Survey:   Results for orders placed or performed in visit on 09/16/23  Lipid Profile   Collection Time: 09/16/23 11:44 AM  Result Value Ref Range   Cholesterol 140 <200 mg/dL   HDL 37 (L) > OR = 50 mg/dL   Triglycerides 161 (H) <150 mg/dL   LDL Cholesterol (Calc) 76 mg/dL (calc)   Total CHOL/HDL Ratio 3.8 <5.0 (calc)   Non-HDL Cholesterol (Calc) 103 <130 mg/dL (calc)  COMPLETE METABOLIC PANEL WITH GFR   Collection Time: 09/16/23 11:44 AM  Result Value Ref Range   Glucose, Bld 102 (H) 65 - 99 mg/dL   BUN 20 7 - 25 mg/dL   Creat 0.96 0.45 - 4.09 mg/dL   eGFR 65 > OR = 60 WJ/XBJ/4.78G9   BUN/Creatinine Ratio SEE NOTE: 6 - 22 (calc)   Sodium 140 135 - 146 mmol/L   Potassium 4.6 3.5 - 5.3 mmol/L   Chloride 102 98 - 110 mmol/L   CO2 32 20 - 32 mmol/L   Calcium 9.6 8.6 - 10.4 mg/dL   Total Protein 6.6 6.1 - 8.1 g/dL   Albumin 4.3 3.6 - 5.1 g/dL   Globulin 2.3 1.9 - 3.7 g/dL (calc)   AG Ratio 1.9  1.0 - 2.5 (calc)   Total Bilirubin 1.0 0.2 - 1.2 mg/dL   Alkaline phosphatase (APISO) 76 37 - 153 U/L   AST 15 10 - 35 U/L   ALT 16 6 - 29 U/L  CBC w/Diff/Platelet   Collection Time: 09/16/23 11:44 AM  Result Value Ref Range   WBC 7.0 3.8 - 10.8 Thousand/uL   RBC 4.48 3.80 - 5.10 Million/uL   Hemoglobin 12.7 11.7 - 15.5 g/dL   HCT 56.2 13.0 - 86.5 %   MCV 87.1 80.0 - 100.0 fL   MCH 28.3 27.0 - 33.0 pg   MCHC 32.6 32.0 - 36.0 g/dL   RDW 78.4 69.6 - 29.5 %   Platelets 199 140 - 400 Thousand/uL   MPV 10.8 7.5 - 12.5 fL   Neutro Abs 4,886 1,500 - 7,800 cells/uL   Absolute Lymphocytes 1,365 850 - 3,900 cells/uL   Absolute Monocytes 525 200 - 950 cells/uL   Eosinophils Absolute 182 15 - 500 cells/uL   Basophils Absolute 42 0 - 200 cells/uL   Neutrophils Relative % 69.8 %   Total Lymphocyte 19.5 %   Monocytes Relative 7.5 %   Eosinophils Relative 2.6 %   Basophils Relative 0.6 %  HgB A1c  Collection Time: 09/16/23 11:44 AM  Result Value Ref Range   Hgb A1c MFr Bld 5.9 (H) <5.7 % of total Hgb   Mean Plasma Glucose 123 mg/dL   eAG (mmol/L) 6.8 mmol/L      Assessment & Plan:   Prediabetes Assessment & Plan: Lab Results  Component Value Date   HGBA1C 5.9 (H) 09/16/2023   HGBA1C 5.8 (H) 03/14/2023   HGBA1C 5.8 (H) 09/13/2022   HGBA1C 5.7 (H) 03/14/2022   HGBA1C 5.6 03/13/2021  Fairly stable prediabetes for the past 2+ years, not on medications, not checking sugars Can recheck A1c again in a few months   Benign hypertension Assessment & Plan: BP Readings from Last 3 Encounters:  02/04/24 138/80  09/16/23 112/66  04/16/23 129/65  Managed on hydrochlorothiazide 12.5 and micardis 80 mg for years, good compliance, no SE or concerns, has been well controlled, BP slightly higher today, but will not make med changes She keeps some mild LE edema Discussed changing individual meds into one combo pill with same doses -  She will check with pharmacy about coverage for switching  to telmisartan-hydrochlorothiazide 80-12.5, once she verifies she can get it we will d/c the individual meds on chart    Orders: -     Telmisartan-HCTZ; Take 1 tablet by mouth in the morning.  Dispense: 90 tablet; Refill: 1  Mixed hyperlipidemia Assessment & Plan: On crestor 20 daily for years Lab Results  Component Value Date   CHOL 140 09/16/2023   HDL 37 (L) 09/16/2023   LDLCALC 76 09/16/2023   TRIG 173 (H) 09/16/2023   CHOLHDL 3.8 09/16/2023  Good compliance, no SE or concerns, labs recently done, will continue to monitor lipid panel annually, meds refilled - she needs after May   Orders: -     Rosuvastatin Calcium; Take 1 tablet (20 mg total) by mouth daily.  Encounter for medication monitoring -     COMPLETE METABOLIC PANEL WITH GFR; Future -     Hemoglobin A1c; Future -     CBC with Differential/Platelet; Future  Encounter for screening mammogram for malignant neoplasm of breast -     3D Screening Mammogram, Left and Right; Future  Bilateral carpal tunnel syndrome Assessment & Plan: Managed with tylenol and then mobic if needed  Orders: -     Meloxicam; Take 0.5-1 tablets (7.5-15 mg total) by mouth daily. PRN  Dispense: 30 tablet; Refill: 2  Osteoarthritis of knee, unspecified laterality, unspecified osteoarthritis type Assessment & Plan: Managed with tylenol and then mobic if needed  Orders: -     Meloxicam; Take 0.5-1 tablets (7.5-15 mg total) by mouth daily. PRN  Dispense: 30 tablet; Refill: 2  Class 1 obesity with serious comorbidity and body mass index (BMI) of 34.0 to 34.9 in adult, unspecified obesity type Assessment & Plan: No sig weight changes since last OV, down 3 lbs Associated comorbidities HTN, HLD, LE edema, prediabetes and knee OA   Bilateral leg edema Assessment & Plan: Suspected depend edema, managed with compression socks, leg elevation low salt diet and hydrochlorothiazide helps as well    Follow up: Labs ordered for future - will  need to be activated and printed when she is here   She will schedule her f/up appt to coordinate when she brings her sister to her PCP - may adjust timing between visits - we will keep meds refilled until then - anticipated around Oct with sister coming in April and doing 6 month f/up appts  Reviewed HM  with pt, she does have MWV scheduled next month as well     Danelle Berry, PA-C 02/04/24 11:23 AM

## 2024-02-04 NOTE — Assessment & Plan Note (Addendum)
 BP Readings from Last 3 Encounters:  02/04/24 138/80  09/16/23 112/66  04/16/23 129/65  Managed on hydrochlorothiazide 12.5 and micardis 80 mg for years, good compliance, no SE or concerns, has been well controlled, BP slightly higher today, but will not make med changes She keeps some mild LE edema Discussed changing individual meds into one combo pill with same doses -  She will check with pharmacy about coverage for switching to telmisartan-hydrochlorothiazide 80-12.5, once she verifies she can get it we will d/c the individual meds on chart

## 2024-02-04 NOTE — Assessment & Plan Note (Addendum)
 On crestor 20 daily for years Lab Results  Component Value Date   CHOL 140 09/16/2023   HDL 37 (L) 09/16/2023   LDLCALC 76 09/16/2023   TRIG 173 (H) 09/16/2023   CHOLHDL 3.8 09/16/2023  Good compliance, no SE or concerns, labs recently done, will continue to monitor lipid panel annually, meds refilled - she needs after May

## 2024-02-04 NOTE — Assessment & Plan Note (Signed)
 Managed with tylenol and then mobic if needed

## 2024-02-04 NOTE — Addendum Note (Signed)
 Addended by: Danelle Berry on: 02/04/2024 12:33 PM   Modules accepted: Level of Service

## 2024-02-04 NOTE — Patient Instructions (Addendum)
 Recommend coming for labs in about 2 months, since your labs were fairly recent (November)  [facility] at [facility] [address], [place], [place] [address] Scheduling phone #: [phone]  Mammogram should be coming up Health Maintenance  Topic Date Due   Medicare Annual Wellness Visit  02/21/2024   COVID-19 Vaccine (6 - Pfizer risk 2024-25 season) 03/03/2024   Mammogram  03/25/2024   DEXA scan (bone density measurement)  06/27/2024   DTaP/Tdap/Td vaccine (3 - Td or Tdap) 08/12/2026   Pneumonia Vaccine  Completed   Flu Shot  Completed   Zoster (Shingles) Vaccine  Completed   HPV Vaccine  Aged Out   Hepatitis C Screening  Discontinued   Cologuard (Stool DNA test)  Discontinued

## 2024-02-04 NOTE — Assessment & Plan Note (Signed)
 No sig weight changes since last OV, down 3 lbs Associated comorbidities HTN, HLD, LE edema, prediabetes and knee OA

## 2024-02-04 NOTE — Assessment & Plan Note (Addendum)
 Lab Results  Component Value Date   HGBA1C 5.9 (H) 09/16/2023   HGBA1C 5.8 (H) 03/14/2023   HGBA1C 5.8 (H) 09/13/2022   HGBA1C 5.7 (H) 03/14/2022   HGBA1C 5.6 03/13/2021  Fairly stable prediabetes for the past 2+ years, not on medications, not checking sugars Can recheck A1c again in a few months

## 2024-02-04 NOTE — Assessment & Plan Note (Signed)
 Suspected depend edema, managed with compression socks, leg elevation low salt diet and hydrochlorothiazide helps as well

## 2024-02-26 ENCOUNTER — Ambulatory Visit (INDEPENDENT_AMBULATORY_CARE_PROVIDER_SITE_OTHER): Payer: Self-pay

## 2024-02-26 DIAGNOSIS — Z0000 Encounter for general adult medical examination without abnormal findings: Secondary | ICD-10-CM | POA: Diagnosis not present

## 2024-02-26 NOTE — Patient Instructions (Addendum)
 Ms. Fadely , Thank you for taking time to come for your Medicare Wellness Visit. I appreciate your ongoing commitment to your health goals. Please review the following plan we discussed and let me know if I can assist you in the future.   Referrals/Orders/Follow-Ups/Clinician Recommendations: NONE  This is a list of the screening recommended for you and due dates:  Health Maintenance  Topic Date Due   COVID-19 Vaccine (6 - Pfizer risk 2024-25 season) 03/03/2024   Mammogram  03/25/2024   Flu Shot  06/11/2024   DEXA scan (bone density measurement)  06/27/2024   Medicare Annual Wellness Visit  02/25/2025   DTaP/Tdap/Td vaccine (3 - Td or Tdap) 08/12/2026   Pneumonia Vaccine  Completed   Zoster (Shingles) Vaccine  Completed   HPV Vaccine  Aged Out   Meningitis B Vaccine  Aged Out   Hepatitis C Screening  Discontinued   Cologuard (Stool DNA test)  Discontinued    Advanced directives: (In Chart) A copy of your advanced directives are scanned into your chart should your provider ever need it.  Next Medicare Annual Wellness Visit scheduled for next year: Yes  03/03/25 @ 1:50 PM BY PHONE

## 2024-02-26 NOTE — Progress Notes (Signed)
 Subjective:   Susan Summers is a 83 y.o. who presents for a Medicare Wellness preventive visit.  Visit Complete: Virtual I connected with  Susan Summers on 02/26/24 by a audio enabled telemedicine application and verified that I am speaking with the correct person using two identifiers.  Patient Location: Home  Provider Location: Office/Clinic  I discussed the limitations of evaluation and management by telemedicine. The patient expressed understanding and agreed to proceed.  Vital Signs: Because this visit was a virtual/telehealth visit, some criteria may be missing or patient reported. Any vitals not documented were not able to be obtained and vitals that have been documented are patient reported.  VideoDeclined- This patient declined Librarian, academic. Therefore the visit was completed with audio only.  Persons Participating in Visit: Patient.  AWV Questionnaire: No: Patient Medicare AWV questionnaire was not completed prior to this visit.  Cardiac Risk Factors include: advanced age (>39men, >25 women);dyslipidemia;hypertension;obesity (BMI >30kg/m2)     Objective:    There were no vitals filed for this visit. There is no height or weight on file to calculate BMI.     02/26/2024    1:39 PM 02/21/2023    1:46 PM 01/01/2022   10:05 AM 12/28/2020    9:37 AM 12/28/2019    9:07 AM 12/24/2018    9:05 AM 12/19/2017    2:11 PM  Advanced Directives  Does Patient Have a Medical Advance Directive? Yes Yes Yes Yes No No Yes  Type of Estate agent of [place];Living will  Healthcare Power of [place];Living will Healthcare Power of [place];Living will   Healthcare Power of [place];Living will  Does patient want to make changes to medical advance directive? No - Patient declined        Copy of Healthcare Power of Attorney in Chart? Yes - validated most recent copy scanned in chart (See row information)  Yes - validated most recent copy  scanned in chart (See row information) Yes - validated most recent copy scanned in chart (See row information)   No - copy requested  Would patient like information on creating a medical advance directive?     No - Patient declined No - Patient declined     Current Medications (verified) Outpatient Encounter Medications as of 02/26/2024  Medication Sig   Cholecalciferol (VITAMIN D PO) Take 4,000 Units by mouth daily.    diphenhydrAMINE HCl (NERVINE PO) Take by mouth.   ELDERBERRY PO Take 1,250 mg by mouth daily.    Garlic 100 MG TABS Take by mouth daily.   hydrochlorothiazide (MICROZIDE) 12.5 MG capsule Take 1 capsule (12.5 mg total) by mouth daily.   meloxicam (MOBIC) 15 MG tablet Take 0.5-1 tablets (7.5-15 mg total) by mouth daily. PRN   metroNIDAZOLE (METROCREAM) 0.75 % cream Apply to mid face every night for Rosacea. May use twice daily for flares.   Probiotic Product (PROBIOTIC DAILY PO) Take by mouth daily.    rosuvastatin (CRESTOR) 20 MG tablet Take 1 tablet (20 mg total) by mouth daily.   telmisartan (MICARDIS) 80 MG tablet TAKE ONE TABLET (80 MG TOTAL) BY MOUTH DAILY.   clobetasol cream (TEMOVATE) 0.05 % Apply twice daily to leg for 2 weeks then decrease to once daily for 2 weeks (if still itching) (Patient not taking: Reported on 02/26/2024)   [START ON 04/05/2024] telmisartan-hydrochlorothiazide (MICARDIS HCT) 80-12.5 MG tablet Take 1 tablet by mouth in the morning. (Patient not taking: Reported on 02/26/2024)   No facility-administered encounter  medications on file as of 02/26/2024.    Allergies (verified) Nickel, Penicillins, and Red dye #40 (allura red)   History: Past Medical History:  Diagnosis Date   At risk for falling 02/04/2018   Carpal tunnel syndrome    Herpes zona 02/04/2018   T8 right   Hyperlipidemia    Hypertension    Osteopenia    Prediabetes    Psoriasis 02/04/2018   elbow   Swelling of both ankles 08/09/2015   Past Surgical History:  Procedure Laterality  Date   ABDOMINAL HYSTERECTOMY     APPENDECTOMY     BREAST BIOPSY Left 10/20/2018   [place] bx, path pending   HERNIA REPAIR     oopherrectomy  2012   UMBILICAL HERNIA REPAIR  2012   Family History  Problem Relation Age of Onset   Hypertension Mother    Stroke Mother    Rheum arthritis Mother    Diabetes Father    Cancer Sister        Ovarian   Diabetes Sister    Hyperlipidemia Sister    Cancer Sister        skin- SCC removed   Melanoma Daughter    Hypertension Brother    Stroke Brother    Cancer Brother        Prostate   Other Brother        bowel obstruction   Hypertension Son    Hyperlipidemia Son    Breast cancer Neg Hx    Social History   Socioeconomic History   Marital status: Widowed    Spouse name: Freida Busman   Number of children: 2   Years of education: Not on file   Highest education level: 12th grade  Occupational History   Occupation: Retired  Tobacco Use   Smoking status: Never   Smokeless tobacco: Never   Tobacco comments:    smoking cessation materials not required  Vaping Use   Vaping status: Never Used  Substance and Sexual Activity   Alcohol use: No    Alcohol/week: 0.0 standard drinks of alcohol   Drug use: No   Sexual activity: Not Currently  Other Topics Concern   Not on file  Social History Narrative   Pt lives alone   Social Drivers of Health   Financial Resource Strain: Low Risk  (02/26/2024)   Overall Financial Resource Strain (CARDIA)    Difficulty of Paying Living Expenses: Not hard at all  Food Insecurity: No Food Insecurity (02/26/2024)   Hunger Vital Sign    Worried About Running Out of Food in the Last Year: Never true    Ran Out of Food in the Last Year: Never true  Transportation Needs: No Transportation Needs (02/26/2024)   PRAPARE - Administrator, Civil Service (Medical): No    Lack of Transportation (Non-Medical): No  Physical Activity: Insufficiently Active (02/26/2024)   Exercise Vital Sign    Days of  Exercise per Week: 5 days    Minutes of Exercise per Session: 20 min  Stress: No Stress Concern Present (02/26/2024)   [employer] of Occupational Health - Occupational Stress Questionnaire    Feeling of Stress : Not at all  Social Connections: Moderately Integrated (02/26/2024)   Social Connection and Isolation Panel [NHANES]    Frequency of Communication with Friends and Family: More than three times a week    Frequency of Social Gatherings with Friends and Family: Once a week    Attends Religious Services: More than 4 times  per year    Active Member of Clubs or Organizations: Yes    Attends Banker Meetings: More than 4 times per year    Marital Status: Widowed    Tobacco Counseling Counseling given: Not Answered Tobacco comments: smoking cessation materials not required    Clinical Intake:  Pre-visit preparation completed: Yes  Pain : No/denies pain     BMI - recorded: 34.2 Nutritional Status: BMI > 30  Obese Nutritional Risks: None Diabetes: No  Lab Results  Component Value Date   HGBA1C 5.9 (H) 09/16/2023   HGBA1C 5.8 (H) 03/14/2023   HGBA1C 5.8 (H) 09/13/2022     How often do you need to have someone help you when you read instructions, pamphlets, or other written materials from your doctor or pharmacy?: 1 - Never  Interpreter Needed?: No  Information entered by :: Kennedy Bucker, LPN   Activities of Daily Living    02/26/2024    1:41 PM 09/16/2023   10:46 AM  In your present state of health, do you have any difficulty performing the following activities:  Hearing? 0 0  Vision? 0 0  Difficulty concentrating or making decisions? 0 0  Walking or climbing stairs? 0 0  Dressing or bathing? 0 0  Doing errands, shopping? 0 0  Preparing Food and eating ? N   Using the Toilet? N   In the past six months, have you accidently leaked urine? N   Do you have problems with loss of bowel control? N   Managing your Medications? N   Managing your  Finances? N   Housekeeping or managing your Housekeeping? N     Patient Care Team: Danelle Berry, PA-C as PCP - General (Family Medicine) Deeann Saint, MD (Orthopedic Surgery) Lockie Mola, MD as Referring Physician (Ophthalmology) Willeen Niece, MD (Dermatology)  Indicate any recent Medical Services you may have received from other than [facility] providers in the past year (date may be approximate).     Assessment:   This is a routine wellness examination for [place].  Hearing/Vision screen Hearing Screening - Comments:: NO AIDS Vision Screening - Comments:: WEARS GLASSES ALL DAY- [facility]   Goals Addressed             This Visit's Progress    DIET - EAT MORE FRUITS AND VEGETABLES         Depression Screen     02/26/2024    1:38 PM 02/04/2024   10:44 AM 09/16/2023   10:46 AM 03/14/2023    8:50 AM 02/21/2023    1:41 PM 09/25/2022    8:31 AM 09/13/2022    8:58 AM  PHQ 2/9 Scores  PHQ - 2 Score 0 0 0 0 0 0 0  PHQ- 9 Score 0  0 0  0 0    Fall Risk     02/26/2024    1:40 PM 02/04/2024   10:44 AM 09/16/2023   10:46 AM 03/14/2023    8:50 AM 02/21/2023    1:38 PM  Fall Risk   Falls in the past year? 0 0 0 0 0  Number falls in past yr: 0 0 0 0 0  Injury with Fall? 0 0 0 0 0  Risk for fall due to : No Fall Risks No Fall Risks No Fall Risks No Fall Risks No Fall Risks  Follow up Falls prevention discussed;Falls evaluation completed  Falls prevention discussed;Education provided;Falls evaluation completed Falls prevention discussed;Education provided;Falls evaluation completed Falls prevention discussed;Education provided  MEDICARE RISK AT HOME:  Medicare Risk at Home Any stairs in or around the home?: Yes If so, are there any without handrails?: No Home free of loose throw rugs in walkways, pet beds, electrical cords, etc?: Yes Adequate lighting in your home to reduce risk of falls?: Yes Life alert?: No Use of a cane, walker or w/c?: No Grab bars in the bathroom?:  Yes Shower chair or bench in shower?: No Elevated toilet seat or a handicapped toilet?: Yes  TIMED UP AND GO:  Was the test performed?  No  Cognitive Function: 6CIT completed        02/26/2024    1:42 PM 02/21/2023    1:47 PM 12/28/2019    9:10 AM 12/24/2018    9:10 AM 12/19/2017    2:01 PM  6CIT Screen  What Year? 0 points 0 points 0 points 0 points 0 points  What month? 0 points 0 points 0 points 0 points 0 points  What time? 0 points 0 points 0 points 0 points 0 points  Count back from 20 0 points 0 points 0 points 0 points 0 points  Months in reverse 0 points 0 points 0 points 0 points 0 points  Repeat phrase 0 points 0 points 0 points 0 points 2 points  Total Score 0 points 0 points 0 points 0 points 2 points    Immunizations Immunization History  Administered Date(s) Administered   Fluad Quad(high Dose 65+) 07/21/2019, 08/21/2022   Influenza, High Dose Seasonal PF 08/07/2017, 08/07/2018, 09/03/2023   Influenza,inj,quad, With Preservative 08/11/2020   Influenza-Unspecified 07/12/2014, 08/02/2015, 08/12/2016, 08/07/2017, 08/07/2018, 08/23/2020, 07/26/2021   PFIZER(Purple Top)SARS-COV-2 Vaccination 11/16/2019, 12/07/2019, 08/08/2020, 02/08/2021   PNEUMOCOCCAL CONJUGATE-20 09/03/2023   Pfizer(Comirnaty)Fall Seasonal Vaccine 12 years and older 09/03/2023   Pneumococcal Conjugate-13 07/26/2014   Pneumococcal Polysaccharide-23 12/16/2016   Td 11/12/2003   Tdap 08/12/2016   Zoster Recombinant(Shingrix) 02/27/2022, 05/06/2022    Screening Tests Health Maintenance  Topic Date Due   COVID-19 Vaccine (6 - Pfizer risk 2024-25 season) 03/03/2024   MAMMOGRAM  03/25/2024   INFLUENZA VACCINE  06/11/2024   DEXA SCAN  06/27/2024   Medicare Annual Wellness (AWV)  02/25/2025   DTaP/Tdap/Td (3 - Td or Tdap) 08/12/2026   Pneumonia Vaccine 40+ Years old  Completed   Zoster Vaccines- Shingrix  Completed   HPV VACCINES  Aged Out   Meningococcal B Vaccine  Aged Out   Hepatitis C  Screening  Discontinued   Fecal DNA (Cologuard)  Discontinued    Health Maintenance  There are no preventive care reminders to display for this patient. Health Maintenance Items Addressed: UP TO DATE ON ALL SHOTS, MAMMOGRAM,  & BONE DENSITY  Additional Screening:  Vision Screening: Recommended annual ophthalmology exams for early detection of glaucoma and other disorders of the eye.  Dental Screening: Recommended annual dental exams for proper oral hygiene  Community Resource Referral / Chronic Care Management: CRR required this visit?  No   CCM required this visit?  No     Plan:     I have personally reviewed and noted the following in the patient's chart:   Medical and social history Use of alcohol, tobacco or illicit drugs  Current medications and supplements including opioid prescriptions. Patient is not currently taking opioid prescriptions. Functional ability and status Nutritional status Physical activity Advanced directives List of other physicians Hospitalizations, surgeries, and ER visits in previous 12 months Vitals Screenings to include cognitive, depression, and falls Referrals and appointments  In addition,  I have reviewed and discussed with patient certain preventive protocols, quality metrics, and best practice recommendations. A written personalized care plan for preventive services as well as general preventive health recommendations were provided to patient.     Pinky Bright, LPN   1/61/0960   After Visit Summary: (MyChart) Due to this being a telephonic visit, the after visit summary with patients personalized plan was offered to patient via MyChart   Notes: Nothing significant to report at this time.

## 2024-03-24 ENCOUNTER — Other Ambulatory Visit: Payer: Self-pay | Admitting: Family Medicine

## 2024-03-24 DIAGNOSIS — R7303 Prediabetes: Secondary | ICD-10-CM | POA: Diagnosis not present

## 2024-03-24 DIAGNOSIS — I10 Essential (primary) hypertension: Secondary | ICD-10-CM

## 2024-03-24 DIAGNOSIS — Z5181 Encounter for therapeutic drug level monitoring: Secondary | ICD-10-CM | POA: Diagnosis not present

## 2024-03-25 ENCOUNTER — Other Ambulatory Visit: Payer: Self-pay | Admitting: Family Medicine

## 2024-03-25 ENCOUNTER — Ambulatory Visit: Payer: Self-pay | Admitting: Family Medicine

## 2024-03-25 DIAGNOSIS — Z1231 Encounter for screening mammogram for malignant neoplasm of breast: Secondary | ICD-10-CM

## 2024-03-25 DIAGNOSIS — Z5181 Encounter for therapeutic drug level monitoring: Secondary | ICD-10-CM

## 2024-03-26 LAB — CBC WITH DIFFERENTIAL/PLATELET
Absolute Lymphocytes: 1143 {cells}/uL (ref 850–3900)
Absolute Monocytes: 447 {cells}/uL (ref 200–950)
Basophils Absolute: 50 {cells}/uL (ref 0–200)
Basophils Relative: 0.7 %
Eosinophils Absolute: 391 {cells}/uL (ref 15–500)
Eosinophils Relative: 5.5 %
HCT: 35.8 % (ref 35.0–45.0)
Hemoglobin: 11.7 g/dL (ref 11.7–15.5)
MCH: 29 pg (ref 27.0–33.0)
MCHC: 32.7 g/dL (ref 32.0–36.0)
MCV: 88.8 fL (ref 80.0–100.0)
MPV: 10.5 fL (ref 7.5–12.5)
Monocytes Relative: 6.3 %
Neutro Abs: 5069 {cells}/uL (ref 1500–7800)
Neutrophils Relative %: 71.4 %
Platelets: 185 10*3/uL (ref 140–400)
RBC: 4.03 10*6/uL (ref 3.80–5.10)
RDW: 13.1 % (ref 11.0–15.0)
Total Lymphocyte: 16.1 %
WBC: 7.1 10*3/uL (ref 3.8–10.8)

## 2024-03-26 LAB — HEMOGLOBIN A1C
Hgb A1c MFr Bld: 5.8 % — ABNORMAL HIGH (ref <5.7)
Mean Plasma Glucose: 120 mg/dL
eAG (mmol/L): 6.6 mmol/L

## 2024-03-26 LAB — TEST AUTHORIZATION

## 2024-03-26 NOTE — Telephone Encounter (Signed)
 Requested Prescriptions  Pending Prescriptions Disp Refills   telmisartan  (MICARDIS ) 80 MG tablet [Pharmacy Med Name: TELMISARTAN  80MG  TABLET] 90 tablet 0    Sig: TAKE ONE TABLET (80 MG TOTAL) BY MOUTH DAILY.     Cardiovascular:  Angiotensin Receptor Blockers Failed - 03/26/2024  8:46 AM      Failed - Cr in normal range and within 180 days    Creat  Date Value Ref Range Status  09/16/2023 0.89 0.60 - 0.95 mg/dL Final         Failed - K in normal range and within 180 days    Potassium  Date Value Ref Range Status  09/16/2023 4.6 3.5 - 5.3 mmol/L Final         Passed - Patient is not pregnant      Passed - Last BP in normal range    BP Readings from Last 1 Encounters:  02/04/24 138/80         Passed - Valid encounter within last 6 months    Recent Outpatient Visits           1 month ago Prediabetes   [facility] Adeline Hone, PA-C

## 2024-03-31 ENCOUNTER — Other Ambulatory Visit: Payer: Self-pay | Admitting: Family Medicine

## 2024-03-31 DIAGNOSIS — E782 Mixed hyperlipidemia: Secondary | ICD-10-CM

## 2024-04-01 ENCOUNTER — Other Ambulatory Visit: Payer: Self-pay | Admitting: Family Medicine

## 2024-04-01 DIAGNOSIS — M25511 Pain in right shoulder: Secondary | ICD-10-CM | POA: Diagnosis not present

## 2024-04-01 DIAGNOSIS — I10 Essential (primary) hypertension: Secondary | ICD-10-CM

## 2024-04-01 DIAGNOSIS — E782 Mixed hyperlipidemia: Secondary | ICD-10-CM

## 2024-04-01 NOTE — Telephone Encounter (Signed)
 Copied from CRM [id number]. Topic: Clinical - Medication Refill >> Apr 01, 2024  1:11 PM Zipporah Him wrote: Medication:  rosuvastatin  (CRESTOR ) 20 MG table, hydrochlorothiazide  (MICROZIDE ) 12.5 MG capsule, telmisartan  (MICARDIS ) 80 MG tablet   Has the patient contacted their pharmacy? Yes They said no refills  This is the patient's preferred pharmacy:  [facility] [place], [place] - [address] [place] [address] Phone: [phone] Fax: [phone]  Is this the correct pharmacy for this prescription? Yes If no, delete pharmacy and type the correct one.   Has the prescription been filled recently? Yes  Is the patient out of the medication? No but close  Has the patient been seen for an appointment in the last year OR does the patient have an upcoming appointment? Yes  Can we respond through MyChart? Yes  Agent: Please be advised that Rx refills may take up to 3 business days. We ask that you follow-up with your pharmacy.

## 2024-04-02 MED ORDER — HYDROCHLOROTHIAZIDE 12.5 MG PO CAPS: 12.5000 mg | ORAL_CAPSULE | Freq: Every day | ORAL | 1 refills | Status: AC

## 2024-04-02 NOTE — Telephone Encounter (Signed)
 Requested Prescriptions  Pending Prescriptions Disp Refills   rosuvastatin  (CRESTOR ) 20 MG tablet 90 tablet     Sig: Take 1 tablet (20 mg total) by mouth daily.     Cardiovascular:  Antilipid - Statins 2 Failed - 04/02/2024  3:39 PM      Failed - Lipid Panel in normal range within the last 12 months    Cholesterol, Total  Date Value Ref Range Status  08/07/2017 126 100 - 199 mg/dL Final   Cholesterol  Date Value Ref Range Status  09/16/2023 140 <200 mg/dL Final   LDL Cholesterol (Calc)  Date Value Ref Range Status  09/16/2023 76 mg/dL (calc) Final    Comment:    Reference range: <100 . Desirable range <100 mg/dL for primary prevention;   <70 mg/dL for patients with CHD or diabetic patients  with > or = 2 CHD risk factors. Aaron Aas LDL-C is now calculated using the Martin-Hopkins  calculation, which is a validated novel method providing  better accuracy than the Friedewald equation in the  estimation of LDL-C.  Melinda Sprawls et al. Erroll Heard. 1610;960(45): 2061-2068  (http://education.[facility].com/faq/FAQ164)    HDL  Date Value Ref Range Status  09/16/2023 37 (L) > OR = 50 mg/dL Final  40/98/1191 39 (L) >39 mg/dL Final   Triglycerides  Date Value Ref Range Status  09/16/2023 173 (H) <150 mg/dL Final         Passed - Cr in normal range and within 360 days    Creat  Date Value Ref Range Status  09/16/2023 0.89 0.60 - 0.95 mg/dL Final         Passed - Patient is not pregnant      Passed - Valid encounter within last 12 months    Recent Outpatient Visits           1 month ago Prediabetes   [facility] Tapia, Leisa, PA-C               hydrochlorothiazide  (MICROZIDE ) 12.5 MG capsule 90 capsule     Sig: Take 1 capsule (12.5 mg total) by mouth daily.     Cardiovascular: Diuretics - Thiazide Failed - 04/02/2024  3:39 PM      Failed - Cr in normal range and within 180 days    Creat  Date Value Ref Range Status  09/16/2023 0.89 0.60 -  0.95 mg/dL Final         Failed - K in normal range and within 180 days    Potassium  Date Value Ref Range Status  09/16/2023 4.6 3.5 - 5.3 mmol/L Final         Failed - Na in normal range and within 180 days    Sodium  Date Value Ref Range Status  09/16/2023 140 135 - 146 mmol/L Final  05/13/2016 142 134 - 144 mmol/L Final         Passed - Last BP in normal range    BP Readings from Last 1 Encounters:  02/04/24 138/80         Passed - Valid encounter within last 6 months    Recent Outpatient Visits           1 month ago Prediabetes   [facility] Adeline Hone, PA-C               telmisartan  (MICARDIS ) 80 MG tablet 90 tablet 0     Cardiovascular:  Angiotensin Receptor Blockers Failed - 04/02/2024  3:39  PM      Failed - Cr in normal range and within 180 days    Creat  Date Value Ref Range Status  09/16/2023 0.89 0.60 - 0.95 mg/dL Final         Failed - K in normal range and within 180 days    Potassium  Date Value Ref Range Status  09/16/2023 4.6 3.5 - 5.3 mmol/L Final         Passed - Patient is not pregnant      Passed - Last BP in normal range    BP Readings from Last 1 Encounters:  02/04/24 138/80         Passed - Valid encounter within last 6 months    Recent Outpatient Visits           1 month ago Prediabetes   [facility] Adeline Hone, PA-C

## 2024-04-02 NOTE — Telephone Encounter (Signed)
 Requested medication (s) are due for refill today: yes  Requested medication (s) are on the active medication list: yes  Last refill:  last attempt was 09/16/23 class: no print  Future visit scheduled: no  Notes to clinic:  did not reach pharmacy due to class   Requested Prescriptions  Pending Prescriptions Disp Refills   hydrochlorothiazide  (MICROZIDE ) 12.5 MG capsule 90 capsule     Sig: Take 1 capsule (12.5 mg total) by mouth daily.     Cardiovascular: Diuretics - Thiazide Failed - 04/02/2024  3:41 PM      Failed - Cr in normal range and within 180 days    Creat  Date Value Ref Range Status  09/16/2023 0.89 0.60 - 0.95 mg/dL Final         Failed - K in normal range and within 180 days    Potassium  Date Value Ref Range Status  09/16/2023 4.6 3.5 - 5.3 mmol/L Final         Failed - Na in normal range and within 180 days    Sodium  Date Value Ref Range Status  09/16/2023 140 135 - 146 mmol/L Final  05/13/2016 142 134 - 144 mmol/L Final         Passed - Last BP in normal range    BP Readings from Last 1 Encounters:  02/04/24 138/80         Passed - Valid encounter within last 6 months    Recent Outpatient Visits           1 month ago Prediabetes   [facility] Adeline Hone, PA-C              Refused Prescriptions Disp Refills   rosuvastatin  (CRESTOR ) 20 MG tablet 90 tablet     Sig: Take 1 tablet (20 mg total) by mouth daily.     Cardiovascular:  Antilipid - Statins 2 Failed - 04/02/2024  3:41 PM      Failed - Lipid Panel in normal range within the last 12 months    Cholesterol, Total  Date Value Ref Range Status  08/07/2017 126 100 - 199 mg/dL Final   Cholesterol  Date Value Ref Range Status  09/16/2023 140 <200 mg/dL Final   LDL Cholesterol (Calc)  Date Value Ref Range Status  09/16/2023 76 mg/dL (calc) Final    Comment:    Reference range: <100 . Desirable range <100 mg/dL for primary prevention;   <70 mg/dL for  patients with CHD or diabetic patients  with > or = 2 CHD risk factors. Aaron Aas LDL-C is now calculated using the Martin-Hopkins  calculation, which is a validated novel method providing  better accuracy than the Friedewald equation in the  estimation of LDL-C.  Melinda Sprawls et al. Erroll Heard. 0981;191(47): 2061-2068  (http://education.[facility].com/faq/FAQ164)    HDL  Date Value Ref Range Status  09/16/2023 37 (L) > OR = 50 mg/dL Final  82/95/6213 39 (L) >39 mg/dL Final   Triglycerides  Date Value Ref Range Status  09/16/2023 173 (H) <150 mg/dL Final         Passed - Cr in normal range and within 360 days    Creat  Date Value Ref Range Status  09/16/2023 0.89 0.60 - 0.95 mg/dL Final         Passed - Patient is not pregnant      Passed - Valid encounter within last 12 months    Recent Outpatient Visits  1 month ago Prediabetes   [facility] Adeline Hone, PA-C               telmisartan  (MICARDIS ) 80 MG tablet 90 tablet 0     Cardiovascular:  Angiotensin Receptor Blockers Failed - 04/02/2024  3:41 PM      Failed - Cr in normal range and within 180 days    Creat  Date Value Ref Range Status  09/16/2023 0.89 0.60 - 0.95 mg/dL Final         Failed - K in normal range and within 180 days    Potassium  Date Value Ref Range Status  09/16/2023 4.6 3.5 - 5.3 mmol/L Final         Passed - Patient is not pregnant      Passed - Last BP in normal range    BP Readings from Last 1 Encounters:  02/04/24 138/80         Passed - Valid encounter within last 6 months    Recent Outpatient Visits           1 month ago Prediabetes   [facility] Adeline Hone, PA-C

## 2024-04-02 NOTE — Telephone Encounter (Signed)
 Requested medications are due for refill today.  unsure  Requested medications are on the active medications list.  yes  Last refill. 02/04/2024  Future visit scheduled.   In 2026  Notes to clinic.  Please review for refill - rx was "printed".    Requested Prescriptions  Pending Prescriptions Disp Refills   rosuvastatin  (CRESTOR ) 20 MG tablet [Pharmacy Med Name: ROSUVASTATIN  CALCIUM  20MG  TABLET] 90 tablet 3    Sig: TAKE ONE TABLET (20 MG TOTAL) BY MOUTH AT BEDTIME.     Cardiovascular:  Antilipid - Statins 2 Failed - 04/02/2024  8:17 AM      Failed - Lipid Panel in normal range within the last 12 months    Cholesterol, Total  Date Value Ref Range Status  08/07/2017 126 100 - 199 mg/dL Final   Cholesterol  Date Value Ref Range Status  09/16/2023 140 <200 mg/dL Final   LDL Cholesterol (Calc)  Date Value Ref Range Status  09/16/2023 76 mg/dL (calc) Final    Comment:    Reference range: <100 . Desirable range <100 mg/dL for primary prevention;   <70 mg/dL for patients with CHD or diabetic patients  with > or = 2 CHD risk factors. Aaron Aas LDL-C is now calculated using the Martin-Hopkins  calculation, which is a validated novel method providing  better accuracy than the Friedewald equation in the  estimation of LDL-C.  Melinda Sprawls et al. Erroll Heard. 5409;811(91): 2061-2068  (http://education.[facility].com/faq/FAQ164)    HDL  Date Value Ref Range Status  09/16/2023 37 (L) > OR = 50 mg/dL Final  47/82/9562 39 (L) >39 mg/dL Final   Triglycerides  Date Value Ref Range Status  09/16/2023 173 (H) <150 mg/dL Final         Passed - Cr in normal range and within 360 days    Creat  Date Value Ref Range Status  09/16/2023 0.89 0.60 - 0.95 mg/dL Final         Passed - Patient is not pregnant      Passed - Valid encounter within last 12 months    Recent Outpatient Visits           1 month ago Prediabetes   [facility] Adeline Hone, PA-C

## 2024-04-26 ENCOUNTER — Ambulatory Visit: Payer: Medicare Other | Admitting: Dermatology

## 2024-05-04 ENCOUNTER — Ambulatory Visit: Payer: Medicare Other | Admitting: Dermatology

## 2024-05-04 DIAGNOSIS — L578 Other skin changes due to chronic exposure to nonionizing radiation: Secondary | ICD-10-CM | POA: Diagnosis not present

## 2024-05-04 DIAGNOSIS — L570 Actinic keratosis: Secondary | ICD-10-CM

## 2024-05-04 DIAGNOSIS — I8393 Asymptomatic varicose veins of bilateral lower extremities: Secondary | ICD-10-CM

## 2024-05-04 DIAGNOSIS — D1801 Hemangioma of skin and subcutaneous tissue: Secondary | ICD-10-CM

## 2024-05-04 DIAGNOSIS — I781 Nevus, non-neoplastic: Secondary | ICD-10-CM | POA: Diagnosis not present

## 2024-05-04 DIAGNOSIS — L719 Rosacea, unspecified: Secondary | ICD-10-CM | POA: Diagnosis not present

## 2024-05-04 DIAGNOSIS — L853 Xerosis cutis: Secondary | ICD-10-CM

## 2024-05-04 DIAGNOSIS — L821 Other seborrheic keratosis: Secondary | ICD-10-CM

## 2024-05-04 DIAGNOSIS — W908XXA Exposure to other nonionizing radiation, initial encounter: Secondary | ICD-10-CM | POA: Diagnosis not present

## 2024-05-04 DIAGNOSIS — L814 Other melanin hyperpigmentation: Secondary | ICD-10-CM | POA: Diagnosis not present

## 2024-05-04 DIAGNOSIS — Z1283 Encounter for screening for malignant neoplasm of skin: Secondary | ICD-10-CM

## 2024-05-04 DIAGNOSIS — D229 Melanocytic nevi, unspecified: Secondary | ICD-10-CM

## 2024-05-04 DIAGNOSIS — L817 Pigmented purpuric dermatosis: Secondary | ICD-10-CM

## 2024-05-04 NOTE — Progress Notes (Signed)
 Follow-Up Visit   Subjective  Susan Summers is a 83 y.o. female who presents for the following: Skin Cancer Screening and Full Body Skin Exam. Patient uses metronidazole  for rosacea and only use prn flares. Patient has noticed a scaly place at upper lip.   The patient presents for Total-Body Skin Exam (TBSE) for skin cancer screening and mole check. The patient has spots, moles and lesions to be evaluated, some may be new or changing and the patient may have concern these could be cancer.   The following portions of the chart were reviewed this encounter and updated as appropriate: medications, allergies, medical history  Review of Systems:  No other skin or systemic complaints except as noted in HPI or Assessment and Plan.  Objective  Well appearing patient in no apparent distress; mood and affect are within normal limits.  A full examination was performed including scalp, head, eyes, ears, nose, lips, neck, chest, axillae, abdomen, back, buttocks, bilateral upper extremities, bilateral lower extremities, hands, feet, fingers, toes, fingernails, and toenails. All findings within normal limits unless otherwise noted below.   Relevant physical exam findings are noted in the Assessment and Plan.  Central upper lip vermillon x1 Pink scaly macules  Assessment & Plan   SKIN CANCER SCREENING PERFORMED TODAY.  ACTINIC DAMAGE - Chronic condition, secondary to cumulative UV/sun exposure - diffuse scaly erythematous macules with underlying dyspigmentation - Recommend daily broad spectrum sunscreen SPF 30+ to sun-exposed areas, reapply every 2 hours as needed.  - Staying in the shade or wearing long sleeves, sun glasses (UVA+UVB protection) and wide brim hats (4-inch brim around the entire circumference of the hat) are also recommended for sun protection.  - Call for new or changing lesions.  LENTIGINES, SEBORRHEIC KERATOSES, HEMANGIOMAS - Benign normal skin lesions - Benign-appearing -  Call for any changes  MELANOCYTIC NEVI - Tan-brown and/or pink-flesh-colored symmetric macules and papules - Benign appearing on exam today - Observation - Call clinic for new or changing moles - Recommend daily use of broad spectrum spf 30+ sunscreen to sun-exposed areas.   Varicose Veins/Spider Veins - Dilated blue, purple or red veins at the lower extremities - Reassured - Smaller vessels can be treated by sclerotherapy (a procedure to inject a medicine into the veins to make them disappear) if desired, but the treatment is not covered by insurance. Larger vessels may be covered if symptomatic and we would refer to vascular surgeon if treatment desired.  Xerosis - diffuse xerotic patches - recommend gentle, hydrating skin care - gentle skin care handout given  ROSACEA Exam: Erythema on cheeks with few small pink papules. Small pink papule on the nose.   Chronic condition with duration or expected duration over one year. Currently well-controlled.    Rosacea is a chronic progressive skin condition usually affecting the face of adults, causing redness and/or acne bumps. It is treatable but not curable. It sometimes affects the eyes (ocular rosacea) as well. It may respond to topical and/or systemic medication and can flare with stress, sun exposure, alcohol, exercise, topical steroids (including hydrocortisone/cortisone 10) and some foods.  Daily application of broad spectrum spf 30+ sunscreen to face is recommended to reduce flares.  Patient denies grittiness of the eyes.  Treatment Plan Continue metronidazole  0.75% cream apply to face QD for rosacea. Use BID for flares. Patient will call when she does need refills sent.   Schamberg's Purpura Exam: Tan,/orange speckled patches at bilateral feet dorsum  Stasis in the legs causes chronic  leg swelling, which may result in itchy or painful rashes, skin discoloration, skin texture changes, and sometimes ulceration.  Recommend daily  graduated compression hose/stockings- easiest to put on first thing in morning, remove at bedtime.  Elevate legs as much as possible. Avoid salt/sodium rich foods.    Chronic and persistent condition with duration or expected duration over one year.    Benign. Observation. Discussed compression socks. [place] can worsen. Keep feet elevated when seated.     ROSACEA   Related Medications metroNIDAZOLE  (METROCREAM ) 0.75 % cream Apply to mid face every night for Rosacea. May use twice daily for flares. AK (ACTINIC KERATOSIS) Central upper lip vermillon x1 Actinic keratoses are precancerous spots that appear secondary to cumulative UV radiation exposure/sun exposure over time. They are chronic with expected duration over 1 year. A portion of actinic keratoses will progress to squamous cell carcinoma of the skin. It is not possible to reliably predict which spots will progress to skin cancer and so treatment is recommended to prevent development of skin cancer.  Recommend daily broad spectrum sunscreen SPF 30+ to sun-exposed areas, reapply every 2 hours as needed.  Recommend staying in the shade or wearing long sleeves, sun glasses (UVA+UVB protection) and wide brim hats (4-inch brim around the entire circumference of the hat). Call for new or changing lesions. Destruction of lesion - Central upper lip vermillon x1  Destruction method: cryotherapy   Informed consent: discussed and consent obtained   Lesion destroyed using liquid nitrogen: Yes   Region frozen until ice ball extended beyond lesion: Yes   Outcome: patient tolerated procedure well with no complications   Post-procedure details: wound care instructions given    Return in about 1 year (around 05/04/2025) for w/ Dr. Jackquline, Rosacea, TBSE.  I, Jacquelynn V. Wilfred, CMA, am acting as scribe for Rexene Jackquline, MD .   Documentation: I have reviewed the above documentation for accuracy and completeness, and I agree with the above.  Rexene Jackquline, MD

## 2024-05-04 NOTE — Patient Instructions (Addendum)

## 2024-06-21 ENCOUNTER — Other Ambulatory Visit: Payer: Self-pay | Admitting: Family Medicine

## 2024-06-21 DIAGNOSIS — I10 Essential (primary) hypertension: Secondary | ICD-10-CM

## 2024-06-24 NOTE — Telephone Encounter (Signed)
 Requested Prescriptions  Pending Prescriptions Disp Refills   telmisartan  (MICARDIS ) 80 MG tablet [Pharmacy Med Name: TELMISARTAN  80MG  TABLET] 90 tablet 0    Sig: TAKE ONE TABLET (80 MG TOTAL) BY MOUTH DAILY.     Cardiovascular:  Angiotensin Receptor Blockers Failed - 06/24/2024  2:14 PM      Failed - Cr in normal range and within 180 days    Creat  Date Value Ref Range Status  09/16/2023 0.89 0.60 - 0.95 mg/dL Final         Failed - K in normal range and within 180 days    Potassium  Date Value Ref Range Status  09/16/2023 4.6 3.5 - 5.3 mmol/L Final         Passed - Patient is not pregnant      Passed - Last BP in normal range    BP Readings from Last 1 Encounters:  02/04/24 138/80         Passed - Valid encounter within last 6 months    Recent Outpatient Visits           4 months ago Prediabetes   [facility] Leavy Mole, PA-C

## 2024-06-28 ENCOUNTER — Other Ambulatory Visit: Payer: Self-pay | Admitting: Family Medicine

## 2024-06-28 DIAGNOSIS — E782 Mixed hyperlipidemia: Secondary | ICD-10-CM

## 2024-06-30 NOTE — Telephone Encounter (Signed)
 Requested Prescriptions  Pending Prescriptions Disp Refills   rosuvastatin  (CRESTOR ) 20 MG tablet [Pharmacy Med Name: ROSUVASTATIN  CALCIUM  20MG  TABLET] 90 tablet 0    Sig: TAKE ONE TABLET (20 MG TOTAL) BY MOUTH AT BEDTIME.     Cardiovascular:  Antilipid - Statins 2 Failed - 06/30/2024  1:02 PM      Failed - Lipid Panel in normal range within the last 12 months    Cholesterol, Total  Date Value Ref Range Status  08/07/2017 126 100 - 199 mg/dL Final   Cholesterol  Date Value Ref Range Status  09/16/2023 140 <200 mg/dL Final   LDL Cholesterol (Calc)  Date Value Ref Range Status  09/16/2023 76 mg/dL (calc) Final    Comment:    Reference range: <100 . Desirable range <100 mg/dL for primary prevention;   <70 mg/dL for patients with CHD or diabetic patients  with > or = 2 CHD risk factors. SABRA LDL-C is now calculated using the Martin-Hopkins  calculation, which is a validated novel method providing  better accuracy than the Friedewald equation in the  estimation of LDL-C.  Gladis APPLETHWAITE et al. SANDREA. 7986;689(80): 2061-2068  (http://education.[facility].com/faq/FAQ164)    HDL  Date Value Ref Range Status  09/16/2023 37 (L) > OR = 50 mg/dL Final  90/72/7981 39 (L) >39 mg/dL Final   Triglycerides  Date Value Ref Range Status  09/16/2023 173 (H) <150 mg/dL Final         Passed - Cr in normal range and within 360 days    Creat  Date Value Ref Range Status  09/16/2023 0.89 0.60 - 0.95 mg/dL Final         Passed - Patient is not pregnant      Passed - Valid encounter within last 12 months    Recent Outpatient Visits           4 months ago Prediabetes   [facility] Leavy Mole, PA-C

## 2024-08-10 DIAGNOSIS — G5603 Carpal tunnel syndrome, bilateral upper limbs: Secondary | ICD-10-CM | POA: Diagnosis not present

## 2024-08-18 ENCOUNTER — Ambulatory Visit (INDEPENDENT_AMBULATORY_CARE_PROVIDER_SITE_OTHER)

## 2024-08-18 DIAGNOSIS — Z23 Encounter for immunization: Secondary | ICD-10-CM | POA: Diagnosis not present

## 2024-09-17 ENCOUNTER — Other Ambulatory Visit: Payer: Self-pay | Admitting: Family Medicine

## 2024-09-17 DIAGNOSIS — I10 Essential (primary) hypertension: Secondary | ICD-10-CM

## 2024-09-20 NOTE — Telephone Encounter (Signed)
 Requested Prescriptions  Pending Prescriptions Disp Refills   telmisartan  (MICARDIS ) 80 MG tablet [Pharmacy Med Name: TELMISARTAN  80MG  TABLET] 90 tablet 0    Sig: TAKE ONE TABLET (80 MG TOTAL) BY MOUTH DAILY.     Cardiovascular:  Angiotensin Receptor Blockers Failed - 09/20/2024 11:25 AM      Failed - Cr in normal range and within 180 days    Creat  Date Value Ref Range Status  09/16/2023 0.89 0.60 - 0.95 mg/dL Final         Failed - K in normal range and within 180 days    Potassium  Date Value Ref Range Status  09/16/2023 4.6 3.5 - 5.3 mmol/L Final         Failed - Valid encounter within last 6 months    Recent Outpatient Visits           7 months ago Prediabetes   [facility] Leavy Mole, PA-C              Passed - Patient is not pregnant      Passed - Last BP in normal range    BP Readings from Last 1 Encounters:  02/04/24 138/80

## 2024-09-24 ENCOUNTER — Other Ambulatory Visit: Payer: Self-pay | Admitting: Family Medicine

## 2024-09-24 DIAGNOSIS — E782 Mixed hyperlipidemia: Secondary | ICD-10-CM

## 2024-09-26 NOTE — Telephone Encounter (Signed)
 Requested medications are due for refill today.  yes  Requested medications are on the active medications list.  yes  Last refill. 06/30/2024 #90 0   Future visit scheduled.   yes  Notes to clinic.  Labs are expired.    Requested Prescriptions  Pending Prescriptions Disp Refills   rosuvastatin  (CRESTOR ) 20 MG tablet [Pharmacy Med Name: ROSUVASTATIN  CALCIUM  20MG  TABLET] 90 tablet 0    Sig: TAKE ONE TABLET (20 MG TOTAL) BY MOUTH AT BEDTIME.     Cardiovascular:  Antilipid - Statins 2 Failed - 09/26/2024  2:31 PM      Failed - Cr in normal range and within 360 days    Creat  Date Value Ref Range Status  09/16/2023 0.89 0.60 - 0.95 mg/dL Final         Failed - Lipid Panel in normal range within the last 12 months    Cholesterol, Total  Date Value Ref Range Status  08/07/2017 126 100 - 199 mg/dL Final   Cholesterol  Date Value Ref Range Status  09/16/2023 140 <200 mg/dL Final   LDL Cholesterol (Calc)  Date Value Ref Range Status  09/16/2023 76 mg/dL (calc) Final    Comment:    Reference range: <100 . Desirable range <100 mg/dL for primary prevention;   <70 mg/dL for patients with CHD or diabetic patients  with > or = 2 CHD risk factors. SABRA LDL-C is now calculated using the Martin-Hopkins  calculation, which is a validated novel method providing  better accuracy than the Friedewald equation in the  estimation of LDL-C.  Gladis APPLETHWAITE et al. SANDREA. 7986;689(80): 2061-2068  (http://education.[facility].com/faq/FAQ164)    HDL  Date Value Ref Range Status  09/16/2023 37 (L) > OR = 50 mg/dL Final  90/72/7981 39 (L) >39 mg/dL Final   Triglycerides  Date Value Ref Range Status  09/16/2023 173 (H) <150 mg/dL Final         Passed - Patient is not pregnant      Passed - Valid encounter within last 12 months    Recent Outpatient Visits           7 months ago Prediabetes   [facility] Leavy Mole, PA-C

## 2024-09-28 ENCOUNTER — Other Ambulatory Visit: Payer: Self-pay | Admitting: Family Medicine

## 2024-09-28 DIAGNOSIS — I10 Essential (primary) hypertension: Secondary | ICD-10-CM

## 2024-09-30 NOTE — Telephone Encounter (Signed)
 Requested medications are due for refill today.  yes  Requested medications are on the active medications list.  yes  Last refill. 04/02/2024 #90 1 rf  Future visit scheduled.   yes  Notes to clinic.  Susan Summers is PCP.    Requested Prescriptions  Pending Prescriptions Disp Refills   hydrochlorothiazide  (MICROZIDE ) 12.5 MG capsule [Pharmacy Med Name: HYDROCHLOROTHIAZIDE  12.5MG  CAPSULE] 90 capsule 1    Sig: Take 1 capsule (12.5 mg total) by mouth daily.     Cardiovascular: Diuretics - Thiazide Failed - 09/30/2024  4:30 PM      Failed - Cr in normal range and within 180 days    Creat  Date Value Ref Range Status  09/16/2023 0.89 0.60 - 0.95 mg/dL Final         Failed - K in normal range and within 180 days    Potassium  Date Value Ref Range Status  09/16/2023 4.6 3.5 - 5.3 mmol/L Final         Failed - Na in normal range and within 180 days    Sodium  Date Value Ref Range Status  09/16/2023 140 135 - 146 mmol/L Final  05/13/2016 142 134 - 144 mmol/L Final         Failed - Valid encounter within last 6 months    Recent Outpatient Visits           7 months ago Prediabetes   [facility] Summers Michelene, PA-C              Passed - Last BP in normal range    BP Readings from Last 1 Encounters:  02/04/24 138/80

## 2024-10-01 NOTE — Telephone Encounter (Signed)
#   not in service.

## 2024-10-01 NOTE — Telephone Encounter (Signed)
Pt due for a follow up

## 2024-10-12 ENCOUNTER — Other Ambulatory Visit: Payer: Self-pay | Admitting: Family Medicine

## 2024-10-12 DIAGNOSIS — E782 Mixed hyperlipidemia: Secondary | ICD-10-CM

## 2024-10-12 NOTE — Telephone Encounter (Signed)
 Copied from CRM #[id number]. Topic: Clinical - Medication Question >> Oct 12, 2024  9:57 AM Terri G wrote: Reason for CRM: Patient sees Dr.Tapia but she is out at the moment. Patient will be seeing NP Brittany Wall tomorrow and she also needs a new prescription written for her rosuvastatin  (CRESTOR ) 20 MG tablet; stated she will run out by the end of the week and also is having hand surgery next week so she needs that medication.

## 2024-10-13 ENCOUNTER — Ambulatory Visit: Admitting: Family Medicine

## 2024-10-13 ENCOUNTER — Encounter: Payer: Self-pay | Admitting: Family Medicine

## 2024-10-13 VITALS — BP 126/82 | HR 78 | Resp 16 | Ht 61.0 in | Wt 182.0 lb

## 2024-10-13 DIAGNOSIS — Z13820 Encounter for screening for osteoporosis: Secondary | ICD-10-CM | POA: Diagnosis not present

## 2024-10-13 DIAGNOSIS — I10 Essential (primary) hypertension: Secondary | ICD-10-CM

## 2024-10-13 DIAGNOSIS — R7303 Prediabetes: Secondary | ICD-10-CM

## 2024-10-13 DIAGNOSIS — Z0000 Encounter for general adult medical examination without abnormal findings: Secondary | ICD-10-CM | POA: Diagnosis not present

## 2024-10-13 DIAGNOSIS — G5603 Carpal tunnel syndrome, bilateral upper limbs: Secondary | ICD-10-CM

## 2024-10-13 DIAGNOSIS — E782 Mixed hyperlipidemia: Secondary | ICD-10-CM | POA: Diagnosis not present

## 2024-10-13 MED ORDER — TELMISARTAN 80 MG PO TABS: 80.0000 mg | ORAL_TABLET | Freq: Every day | ORAL | 0 refills | Status: AC

## 2024-10-13 MED ORDER — ROSUVASTATIN CALCIUM 20 MG PO TABS: 20.0000 mg | ORAL_TABLET | Freq: Every day | ORAL | 0 refills | Status: AC

## 2024-10-13 NOTE — Assessment & Plan Note (Signed)
 A1c remains stable. Last A1c 5.8. Orders:   HgB A1c

## 2024-10-13 NOTE — Assessment & Plan Note (Addendum)
-  HTN controlled; BP at goal.  -Continue Telmisartan  (Micardis ) 80mg  once daily. Refill provided, -Continue Hydrochlorothiazide  12.5mg  once daily. Pt denied refill. Advised to call office if refill needed.  Orders:   telmisartan  (MICARDIS ) 80 MG tablet; Take 1 tablet (80 mg total) by mouth daily.   CBC w/Diff/Platelet   Comprehensive Metabolic Panel (CMET)

## 2024-10-13 NOTE — Assessment & Plan Note (Signed)
 Follows with ortho for management of bilateral carpal tunnel syndrome. She states she has surgery scheduled for her right wrist next week.  -Continue with ortho follow up.

## 2024-10-13 NOTE — Assessment & Plan Note (Addendum)
-  Hyperlipidemia overall controlled. Last LDL adequate at 76. -Continue Rosuvastatin  (Crestor ) 20mg  once daily. Refill provided. -Returning tomorrow morning for fasting labs. Labs ordered.  Orders:   rosuvastatin  (CRESTOR ) 20 MG tablet; Take 1 tablet (20 mg total) by mouth daily.   Comprehensive Metabolic Panel (CMET)   Lipid Profile

## 2024-10-13 NOTE — Progress Notes (Signed)
 Established Patient Office Visit  Subjective   Patient ID: Susan Summers, female    DOB: 11/25/1940  Age: 83 y.o. MRN: [id number]  Chief Complaint  Patient presents with   Medical Management of Chronic Issues    HPI Patient is a pleasant 83 year old female who returns today for follow up on chronic conditions. She is a new patient to myself, however established in office. Seen last in office 01/2024.  She reports next week she has carpal tunnel surgery scheduled for her right wrist. She is followed by [facility], Dr. Francisco.   She reports she needs Crestor  refilled. She also voices she needs a refill on her blood pressure medication, referring to Telmisartan . She denies refills on her fluid pill, referring to Hydrochlorothiazide , stating she has plenty. She will call if she finds this to be incorrect. She admits she did not take hydrochlorothiazide  today due to being away from home and running several errands. She voices typically she takes HCTZ every morning, however typically on Sundays she takes it late morning/early afternoon after returning from church. We did discuss importance of consistency regarding timing of taking medication, advising medication should be taken around the same time each morning. She voices understanding.  She does endorse some swelling of the legs however typically swelling has resolved by the next morning. Denies associated shortness of breath, chest pain or palpitations.  +1 pitting bilateral lower extremity edema present at time of exam today. No increased warmth or redness.    Healthcare maintenance addressed- bone density scan and mammogram.  Last mammogram without acute findings, 03/2023. USPSTF recommends mammograms for ages 51 to 56, however advised patient that she can proceed with a mammogram. She declines mammogram. She states if abnormalities were to be found she would not pursue treatment or proceed with other interventions.  She is agreeable to DEXA  scan.    She is not fasting today but plans tomorrow morning for her fasting labs.   Review of Systems  Respiratory:  Negative for shortness of breath.   Cardiovascular:  Positive for leg swelling. Negative for chest pain and palpitations.      Objective:     BP 126/82   Pulse 78   Resp 16   Ht 5' 1 (1.549 m)   Wt 182 lb (82.6 kg)   SpO2 98%   BMI 34.39 kg/m  BP Readings from Last 3 Encounters:  10/13/24 126/82  02/04/24 138/80  09/16/23 112/66   Wt Readings from Last 3 Encounters:  10/13/24 182 lb (82.6 kg)  02/04/24 181 lb 8 oz (82.3 kg)  09/16/23 184 lb 4.8 oz (83.6 kg)      Physical Exam Constitutional:      Appearance: Normal appearance.  Cardiovascular:     Rate and Rhythm: Normal rate and regular rhythm.     Heart sounds: Normal heart sounds.  Pulmonary:     Effort: Pulmonary effort is normal. No respiratory distress.     Breath sounds: Normal breath sounds. No wheezing, rhonchi or rales.  Musculoskeletal:     Right lower leg: Edema present.     Left lower leg: Edema present.  Skin:    General: Skin is warm and dry.  Neurological:     General: No focal deficit present.     Mental Status: She is alert. Mental status is at baseline.  Psychiatric:        Mood and Affect: Mood normal.        Behavior: Behavior normal.  Last CBC Lab Results  Component Value Date   WBC 7.1 03/24/2024   HGB 11.7 03/24/2024   HCT 35.8 03/24/2024   MCV 88.8 03/24/2024   MCH 29.0 03/24/2024   RDW 13.1 03/24/2024   PLT 185 03/24/2024   Last metabolic panel Lab Results  Component Value Date   GLUCOSE 102 (H) 09/16/2023   NA 140 09/16/2023   K 4.6 09/16/2023   CL 102 09/16/2023   CO2 32 09/16/2023   BUN 20 09/16/2023   CREATININE 0.89 09/16/2023   EGFR 65 09/16/2023   CALCIUM  9.6 09/16/2023   PROT 6.6 09/16/2023   ALBUMIN 4.7 11/13/2016   LABGLOB 2.2 05/13/2016   AGRATIO 2.1 05/13/2016   BILITOT 1.0 09/16/2023   ALKPHOS 79 11/13/2016   AST 15  09/16/2023   ALT 16 09/16/2023   Last lipids Lab Results  Component Value Date   CHOL 140 09/16/2023   HDL 37 (L) 09/16/2023   LDLCALC 76 09/16/2023   TRIG 173 (H) 09/16/2023   CHOLHDL 3.8 09/16/2023   Last hemoglobin A1c Lab Results  Component Value Date   HGBA1C 5.8 (H) 03/24/2024          Assessment & Plan:   Assessment & Plan Benign hypertension -HTN controlled; BP at goal.  -Continue Telmisartan  (Micardis ) 80mg  once daily. Refill provided, -Continue Hydrochlorothiazide  12.5mg  once daily. Pt denied refill. Advised to call office if refill needed.  Orders:   telmisartan  (MICARDIS ) 80 MG tablet; Take 1 tablet (80 mg total) by mouth daily.   CBC w/Diff/Platelet   Comprehensive Metabolic Panel (CMET)  Mixed hyperlipidemia -Hyperlipidemia overall controlled. Last LDL adequate at 76. -Continue Rosuvastatin  (Crestor ) 20mg  once daily. Refill provided. -Returning tomorrow morning for fasting labs. Labs ordered.  Orders:   rosuvastatin  (CRESTOR ) 20 MG tablet; Take 1 tablet (20 mg total) by mouth daily.   Comprehensive Metabolic Panel (CMET)   Lipid Profile  Bilateral carpal tunnel syndrome Follows with ortho for management of bilateral carpal tunnel syndrome. She states she has surgery scheduled for her right wrist next week.  -Continue with ortho follow up.     Prediabetes A1c remains stable. Last A1c 5.8. Orders:   HgB A1c  Osteoporosis screening DEXA scan ordered for osteoporosis screening.  Orders:   DG Bone Density; Future  Healthcare maintenance -Routine labs ordered for chronic condition management.  -DEXA screening discussed. Pt agreeable. Dexa ordered. -Mammogram offered, pt declined mammogram.  Orders:   DG Bone Density; Future   CBC w/Diff/Platelet      Return in about 6 months (around 04/13/2025).    LAYMON LOISE CORE, FNP

## 2024-10-14 NOTE — Telephone Encounter (Signed)
 Requested Prescriptions  Pending Prescriptions Disp Refills   rosuvastatin  (CRESTOR ) 20 MG tablet 90 tablet 0     Cardiovascular:  Antilipid - Statins 2 Failed - 10/14/2024  5:54 PM      Failed - Cr in normal range and within 360 days    Creat  Date Value Ref Range Status  09/16/2023 0.89 0.60 - 0.95 mg/dL Final         Failed - Lipid Panel in normal range within the last 12 months    Cholesterol, Total  Date Value Ref Range Status  08/07/2017 126 100 - 199 mg/dL Final   Cholesterol  Date Value Ref Range Status  09/16/2023 140 <200 mg/dL Final   LDL Cholesterol (Calc)  Date Value Ref Range Status  09/16/2023 76 mg/dL (calc) Final    Comment:    Reference range: <100 . Desirable range <100 mg/dL for primary prevention;   <70 mg/dL for patients with CHD or diabetic patients  with > or = 2 CHD risk factors. SABRA LDL-C is now calculated using the Martin-Hopkins  calculation, which is a validated novel method providing  better accuracy than the Friedewald equation in the  estimation of LDL-C.  Gladis APPLETHWAITE et al. SANDREA. 7986;689(80): 2061-2068  (http://education.[facility].com/faq/FAQ164)    HDL  Date Value Ref Range Status  09/16/2023 37 (L) > OR = 50 mg/dL Final  90/72/7981 39 (L) >39 mg/dL Final   Triglycerides  Date Value Ref Range Status  09/16/2023 173 (H) <150 mg/dL Final         Passed - Patient is not pregnant      Passed - Valid encounter within last 12 months    Recent Outpatient Visits           Yesterday Benign hypertension   [facility] [place], Laymon SAILOR, FNP   8 months ago Prediabetes   [facility] Leavy Mole, PA-C

## 2024-10-15 ENCOUNTER — Ambulatory Visit: Payer: Self-pay | Admitting: Family Medicine

## 2024-10-15 LAB — CBC WITH DIFFERENTIAL/PLATELET
Absolute Lymphocytes: 1373 {cells}/uL (ref 850–3900)
Absolute Monocytes: 500 {cells}/uL (ref 200–950)
Basophils Absolute: 43 {cells}/uL (ref 0–200)
Basophils Relative: 0.7 %
Eosinophils Absolute: 220 {cells}/uL (ref 15–500)
Eosinophils Relative: 3.6 %
HCT: 35.5 % — ABNORMAL LOW (ref 35.9–46.0)
Hemoglobin: 11.6 g/dL — ABNORMAL LOW (ref 11.7–15.5)
MCH: 28.4 pg (ref 27.0–33.0)
MCHC: 32.7 g/dL (ref 31.6–35.4)
MCV: 86.8 fL (ref 81.4–101.7)
MPV: 11.1 fL (ref 7.5–12.5)
Monocytes Relative: 8.2 %
Neutro Abs: 3965 {cells}/uL (ref 1500–7800)
Neutrophils Relative %: 65 %
Platelets: 197 Thousand/uL (ref 140–400)
RBC: 4.09 Million/uL (ref 3.80–5.10)
RDW: 12.7 % (ref 11.0–15.0)
Total Lymphocyte: 22.5 %
WBC: 6.1 Thousand/uL (ref 3.8–10.8)

## 2024-10-15 LAB — COMPREHENSIVE METABOLIC PANEL WITH GFR
AG Ratio: 2.2 (calc) (ref 1.0–2.5)
ALT: 16 U/L (ref 6–29)
AST: 18 U/L (ref 10–35)
Albumin: 4.4 g/dL (ref 3.6–5.1)
Alkaline phosphatase (APISO): 72 U/L (ref 37–153)
BUN: 17 mg/dL (ref 7–25)
CO2: 30 mmol/L (ref 20–32)
Calcium: 9 mg/dL (ref 8.6–10.4)
Chloride: 105 mmol/L (ref 98–110)
Creat: 0.88 mg/dL (ref 0.60–0.95)
Globulin: 2 g/dL (ref 1.9–3.7)
Glucose, Bld: 107 mg/dL — ABNORMAL HIGH (ref 65–99)
Potassium: 4.6 mmol/L (ref 3.5–5.3)
Sodium: 142 mmol/L (ref 135–146)
Total Bilirubin: 1.1 mg/dL (ref 0.2–1.2)
Total Protein: 6.4 g/dL (ref 6.1–8.1)
eGFR: 66 mL/min/1.73m2 (ref >=60)

## 2024-10-15 LAB — LIPID PANEL
Cholesterol: 106 mg/dL (ref <200)
HDL: 34 mg/dL — ABNORMAL LOW (ref >=50)
LDL Cholesterol (Calc): 50 mg/dL
Non-HDL Cholesterol (Calc): 72 mg/dL (ref <130)
Total CHOL/HDL Ratio: 3.1 (calc) (ref <5.0)
Triglycerides: 136 mg/dL (ref <150)

## 2024-10-15 LAB — HEMOGLOBIN A1C
Hgb A1c MFr Bld: 5.6 % (ref <5.7)
Mean Plasma Glucose: 114 mg/dL
eAG (mmol/L): 6.3 mmol/L

## 2024-10-27 ENCOUNTER — Ambulatory Visit: Admitting: Family Medicine

## 2025-03-03 ENCOUNTER — Ambulatory Visit

## 2025-04-13 ENCOUNTER — Ambulatory Visit: Admitting: Family Medicine

## 2025-04-27 ENCOUNTER — Ambulatory Visit: Admitting: Family Medicine

## 2025-05-17 ENCOUNTER — Encounter: Admitting: Dermatology
